# Patient Record
Sex: Male | Born: 1950
Health system: Southern US, Community
[De-identification: ages and names within clinical notes are randomized; demographics above are authoritative.]

## PROBLEM LIST (undated history)

## (undated) DIAGNOSIS — I471 Supraventricular tachycardia, unspecified: Secondary | ICD-10-CM

## (undated) DIAGNOSIS — K219 Gastro-esophageal reflux disease without esophagitis: Secondary | ICD-10-CM

## (undated) DIAGNOSIS — R351 Nocturia: Secondary | ICD-10-CM

## (undated) DIAGNOSIS — N400 Enlarged prostate without lower urinary tract symptoms: Secondary | ICD-10-CM

## (undated) DIAGNOSIS — F419 Anxiety disorder, unspecified: Secondary | ICD-10-CM

## (undated) DIAGNOSIS — M1990 Unspecified osteoarthritis, unspecified site: Secondary | ICD-10-CM

## (undated) DIAGNOSIS — I999 Unspecified disorder of circulatory system: Secondary | ICD-10-CM

## (undated) DIAGNOSIS — G459 Transient cerebral ischemic attack, unspecified: Secondary | ICD-10-CM

## (undated) DIAGNOSIS — G4700 Insomnia, unspecified: Secondary | ICD-10-CM

## (undated) DIAGNOSIS — N050 Unspecified nephritic syndrome with minor glomerular abnormality: Secondary | ICD-10-CM

## (undated) HISTORY — DX: Unspecified nephritic syndrome with minor glomerular abnormality: N05.0

## (undated) HISTORY — DX: Nocturia: R35.1

## (undated) HISTORY — DX: Unspecified disorder of circulatory system: I99.9

## (undated) HISTORY — DX: Insomnia, unspecified: G47.00

## (undated) HISTORY — DX: Benign prostatic hyperplasia without lower urinary tract symptoms: N40.0

## (undated) HISTORY — DX: Anxiety disorder, unspecified: F41.9

## (undated) HISTORY — PX: RENAL BIOPSY: SHX156

---

## 2011-01-04 ENCOUNTER — Ambulatory Visit: Payer: Self-pay | Admitting: Family Medicine

## 2011-01-12 ENCOUNTER — Ambulatory Visit: Payer: Self-pay | Admitting: Family Medicine

## 2011-02-23 ENCOUNTER — Ambulatory Visit: Payer: Self-pay

## 2011-03-15 LAB — HM COLONOSCOPY

## 2011-06-11 ENCOUNTER — Emergency Department: Payer: Self-pay | Admitting: Emergency Medicine

## 2011-06-11 LAB — COMPREHENSIVE METABOLIC PANEL
Albumin: 4.2 g/dL (ref 3.4–5.0)
Alkaline Phosphatase: 65 U/L (ref 50–136)
Anion Gap: 11 (ref 7–16)
Calcium, Total: 9.2 mg/dL (ref 8.5–10.1)
Chloride: 107 mmol/L (ref 98–107)
EGFR (African American): >60
EGFR (Non-African Amer.): >60
Osmolality: 286 (ref 275–301)
Potassium: 3.8 mmol/L (ref 3.5–5.1)

## 2011-06-11 LAB — CBC
HCT: 45.6 % (ref 40.0–52.0)
MCH: 31.1 pg (ref 26.0–34.0)
MCHC: 34.1 g/dL (ref 32.0–36.0)
Platelet: 252 10*3/uL (ref 150–440)
RBC: 5 10*6/uL (ref 4.40–5.90)

## 2011-06-11 LAB — CK TOTAL AND CKMB (NOT AT ARMC): CK-MB: 3.9 ng/mL — ABNORMAL HIGH (ref 0.5–3.6)

## 2011-06-11 LAB — TROPONIN I: Troponin-I: <0.02 ng/mL

## 2012-01-16 ENCOUNTER — Ambulatory Visit: Payer: Self-pay | Admitting: Gastroenterology

## 2012-04-18 ENCOUNTER — Ambulatory Visit: Payer: Self-pay | Admitting: Family Medicine

## 2012-04-18 LAB — DOT URINE DIP: Glucose,UR: NEGATIVE mg/dL (ref 0–75)

## 2012-08-20 DIAGNOSIS — Z85828 Personal history of other malignant neoplasm of skin: Secondary | ICD-10-CM | POA: Insufficient documentation

## 2014-01-11 DIAGNOSIS — IMO0001 Reserved for inherently not codable concepts without codable children: Secondary | ICD-10-CM | POA: Insufficient documentation

## 2014-01-11 DIAGNOSIS — N040 Nephrotic syndrome with minor glomerular abnormality: Secondary | ICD-10-CM | POA: Insufficient documentation

## 2014-01-28 DIAGNOSIS — M7060 Trochanteric bursitis, unspecified hip: Secondary | ICD-10-CM | POA: Insufficient documentation

## 2014-08-05 DIAGNOSIS — M25852 Other specified joint disorders, left hip: Secondary | ICD-10-CM | POA: Insufficient documentation

## 2014-08-26 ENCOUNTER — Other Ambulatory Visit: Payer: Self-pay | Admitting: Family Medicine

## 2014-08-26 ENCOUNTER — Telehealth: Payer: Self-pay

## 2014-08-26 DIAGNOSIS — F329 Major depressive disorder, single episode, unspecified: Secondary | ICD-10-CM

## 2014-08-26 DIAGNOSIS — F32A Depression, unspecified: Secondary | ICD-10-CM

## 2014-08-26 NOTE — Telephone Encounter (Signed)
Refill request via fax for Escitalopram

## 2014-09-03 DIAGNOSIS — G4700 Insomnia, unspecified: Secondary | ICD-10-CM | POA: Insufficient documentation

## 2014-09-22 ENCOUNTER — Ambulatory Visit (INDEPENDENT_AMBULATORY_CARE_PROVIDER_SITE_OTHER): Payer: 59 | Admitting: Urology

## 2014-09-22 VITALS — BP 116/79 | HR 61 | Resp 18 | Ht 69.0 in | Wt 189.7 lb

## 2014-09-22 DIAGNOSIS — K219 Gastro-esophageal reflux disease without esophagitis: Secondary | ICD-10-CM | POA: Insufficient documentation

## 2014-09-22 DIAGNOSIS — N138 Other obstructive and reflux uropathy: Secondary | ICD-10-CM | POA: Insufficient documentation

## 2014-09-22 DIAGNOSIS — R350 Frequency of micturition: Secondary | ICD-10-CM

## 2014-09-22 DIAGNOSIS — N401 Enlarged prostate with lower urinary tract symptoms: Secondary | ICD-10-CM

## 2014-09-22 LAB — BLADDER SCAN AMB NON-IMAGING

## 2014-09-22 NOTE — Progress Notes (Unsigned)
Patient ID: Matthew Maldonado, male   DOB: October 22, 1950, 64 y.o.   MRN: [id number]  Subjective: Matthew Maldonado is a 64 yo WM who is self referred with voiding symptoms.  Matthew Maldonado has a friend with prostate cancer and wants to get checked.  Matthew Maldonado has no hesitancy but Matthew Maldonado has a reduced stream but it is adequate.   Matthew Maldonado has occasional urgency.  Matthew Maldonado has nocturia x 2-3x.  Matthew Maldonado has some intermittency but empties well.  Matthew Maldonado has no hematuria or dysuria.  Matthew Maldonado has no history of UTI's, stones or GU surgery.  Matthew Maldonado has not had a prior PSA level. ROS:  Review of Systems  Constitutional: Positive for malaise/fatigue.  HENT:       Sinus congestion  Gastrointestinal: Positive for heartburn and diarrhea (all day).  Genitourinary:       Nocturia and intermittency with a weak stream  Musculoskeletal: Positive for back pain and joint pain.  All other systems reviewed and are negative.   No Known Allergies  Past Medical History  Diagnosis Date  . Anxiety   . Insomnia     History reviewed. No pertinent past surgical history.  History   Social History  . Marital Status: Married    Spouse Name: N/A  . Number of Children: N/A  . Years of Education: N/A   Occupational History  . Not on file.   Social History Main Topics  . Smoking status: Never Smoker   . Smokeless tobacco: Not on file  . Alcohol Use: 1.8 oz/week    3 Standard drinks or equivalent per week  . Drug Use: Not on file  . Sexual Activity: Not on file   Other Topics Concern  . Not on file   Social History Narrative    Family History  Problem Relation Age of Onset  . Family history unknown: Yes    Outpatient Encounter Prescriptions as of 09/22/2014  Medication Sig Note  . cycloSPORINE modified (NEORAL) 100 MG capsule Take 100 mg by mouth. 09/22/2014: Received from: [facility]  . escitalopram (LEXAPRO) 20 MG tablet take 1 tablet by mouth once daily MUST HAVE APPT FOR FURTHER REFILLS.   . fluticasone (FLONASE) 50 MCG/ACT nasal spray Place 2 sprays into  both nostrils daily.   Marland Kitchen ketoconazole (NIZORAL) 2 % shampoo Apply 1 application topically 2 (two) times a week.   . Multiple Vitamins-Minerals (MULTIVITAMIN WITH MINERALS) tablet Take 1 tablet by mouth daily.   . traZODone (DESYREL) 50 MG tablet Take by mouth. 09/22/2014: Received from: [place]  . [DISCONTINUED] Multiple Vitamins tablet Take by mouth. 09/22/2014: Received from: [facility]  . Omega-3 Fatty Acids (FISH OIL) 435 MG CAPS Take 1 capsule by mouth daily.   . [DISCONTINUED] cycloSPORINE (SANDIMMUNE) 100 MG capsule Take by mouth. 09/22/2014: Received from: [place]  . [DISCONTINUED] loratadine (CLARITIN REDITABS) 10 MG dissolvable tablet Take 10 mg by mouth daily.   . [DISCONTINUED] pseudoephedrine-guaifenesin (MUCINEX D) 60-600 MG per tablet Take 1 tablet by mouth every 12 (twelve) hours.   . [DISCONTINUED] ranitidine (ZANTAC) 150 MG capsule Take 150 mg by mouth 2 (two) times daily.    No facility-administered encounter medications on file as of 09/22/2014.      Objective: Filed Vitals:   09/22/14 1153  BP: 116/79  Pulse: 61  Resp: 18     Physical Exam  Constitutional: Matthew Maldonado is oriented to person, place, and time and well-developed, well-nourished, and in no distress.  HENT:  Head: Normocephalic and atraumatic.  Neck: Normal range of motion. Neck supple.  Cardiovascular: Normal rate, regular rhythm and normal heart sounds.   Pulmonary/Chest: Effort normal and breath sounds normal. No respiratory distress.  Abdominal: Soft. Matthew Maldonado exhibits no distension and no mass. There is no tenderness. There is no guarding.  Genitourinary: Rectum normal and penis normal.  Prostate is 2+ symmetrical and smooth without nodules.   There are external and internal hemorrhoids.  No masses are noted.   Musculoskeletal: Normal range of motion. Matthew Maldonado exhibits no edema.  Lymphadenopathy:    Matthew Maldonado has no cervical adenopathy.  Neurological: Matthew Maldonado is alert and oriented  to person, place, and time.  Skin: Skin is warm and dry.  Psychiatric: Mood and affect normal.  Vitals reviewed.   Lab Results:  No results for input(s): WBC, HGB, HCT, PLT in the last 72 hours. BMET No results for input(s): NA, K, CL, CO2, GLUCOSE, BUN, CREATININE, CALCIUM in the last 72 hours. PT/INR No results for input(s): LABPROT, INR in the last 72 hours. ABG No results for input(s): PHART, HCO3 in the last 72 hours.  Invalid input(s): PCO2, PO2  Studies/Results: No results found. IPSS is 20/4  PVR by [place] 8ml  Assessment: Matthew Maldonado has BPH with BOO with moderated LUTS.  Matthew Maldonado is most bothered by nocturia.  Matthew Maldonado is concerned about possible prostate cancer but has a benign exam.  Plan: I am going to get a PSA. Matthew Maldonado was unable to get a urine. I will give him samples of Rapaflo 8mg  #21 and reviewed the side effects and instructions. Matthew Maldonado will return in 2-3 weeks for a repeat IPSS.   Macallister Ashmead J 09/22/2014

## 2014-09-23 LAB — PSA TOTAL (REFLEX TO FREE)

## 2014-10-07 ENCOUNTER — Ambulatory Visit: Payer: Self-pay

## 2014-10-30 ENCOUNTER — Ambulatory Visit (INDEPENDENT_AMBULATORY_CARE_PROVIDER_SITE_OTHER): Payer: 59 | Admitting: Urology

## 2014-10-30 ENCOUNTER — Encounter: Payer: Self-pay | Admitting: Urology

## 2014-10-30 VITALS — BP 131/91 | HR 57 | Resp 16 | Ht 69.0 in | Wt 197.9 lb

## 2014-10-30 DIAGNOSIS — R351 Nocturia: Secondary | ICD-10-CM

## 2014-10-30 DIAGNOSIS — N401 Enlarged prostate with lower urinary tract symptoms: Secondary | ICD-10-CM | POA: Diagnosis not present

## 2014-10-30 DIAGNOSIS — N400 Enlarged prostate without lower urinary tract symptoms: Secondary | ICD-10-CM

## 2014-10-30 DIAGNOSIS — N138 Other obstructive and reflux uropathy: Secondary | ICD-10-CM

## 2014-10-30 LAB — BLADDER SCAN AMB NON-IMAGING

## 2014-10-30 NOTE — Progress Notes (Signed)
10/30/2014 9:51 AM   Matthew Maldonado Dec 31, 1950 [id number]  Referring provider: Gearldine Shown, DO [place], [place] [address]  Chief Complaint  Patient presents with  . Follow-up    2 wk f/u on medication (PSA, IPSS, PVR)  . Benign Prostatic Hypertrophy    HPI: Patient is a 64 year old white male who referred himself to [place] after a friend had been diagnosed with prostate cancer.  He was having a reduced stream, occasional urgency, intermittency and nocturia 3.    He was seen and evaluated by Dr. Jeffie Pollock 3 weeks ago and was found to have a benign prostate exam and was started on Rapaflo 8 mg for his lower urinary tract symptoms.  He presents today to discuss his response to the Rapaflo.  His IPSS score today is 11, which is moderate lower urinary tract symptomatology. He is delighted with his quality life due to his urinary symptoms. His PVR is 56 mL.  His previous IPSS score was 21/4.  His previous PVR is 64 mL.    He actually did not take the Rapaflo samples that were given to him for fear they may interact with cyclosporine that he was prescribed.  He instead did some behavioral modification and reduced his fluid intake prior to bed and has found an improvement of his symptoms. He denies any dysuria, hematuria or suprapubic pain.      He also denies any recent fevers, chills, nausea or vomiting.      IPSS      09/22/14 1200 10/30/14 0900     International Prostate Symptom Score   How often have you had the sensation of not emptying your bladder? About half the time Less than 1 in 5    How often have you had to urinate less than every two hours? More than half the time Less than half the time    How often have you found you stopped and started again several times when you urinated? More than half the time Less than half the time    How often have you found it difficult to postpone urination? About half the time Less than half the time    How often have you  had a weak urinary stream? More than half the time Less than half the time    How often have you had to strain to start urination? Not at All Not at All    How many times did you typically get up at night to urinate? 3 Times 2 Times    Total IPSS Score 21 11    Quality of Life due to urinary symptoms   If you were to spend the rest of your life with your urinary condition just the way it is now how would you feel about that? Mostly Disatisfied Delighted       Score:  1-7 Mild 8-19 Moderate 20-35 Severe  PMH: Past Medical History  Diagnosis Date  . Anxiety   . Insomnia   . Minimal change disease     Surgical History: No past surgical history on file.  Home Medications:    Medication List       This list is accurate as of: 10/30/14  9:51 AM.  Always use your most recent med list.               cycloSPORINE modified 100 MG capsule  Commonly known as:  NEORAL  Take 100 mg by mouth.     escitalopram 20  MG tablet  Commonly known as:  LEXAPRO  take 1 tablet by mouth once daily MUST HAVE APPT FOR FURTHER REFILLS.     Fish Oil 435 MG Caps  Take 1 capsule by mouth daily.     fluticasone 50 MCG/ACT nasal spray  Commonly known as:  FLONASE  Place 2 sprays into both nostrils daily.     ketoconazole 2 % shampoo  Commonly known as:  NIZORAL     multivitamin with minerals tablet  Take 1 tablet by mouth daily.     traZODone 50 MG tablet  Commonly known as:  DESYREL  Take by mouth.        Allergies: No Known Allergies  Family History: Family History  Problem Relation Age of Onset  . Family history unknown: Yes    Social History:  reports that he has never smoked. He does not have any smokeless tobacco history on file. He reports that he drinks about 1.8 oz of alcohol per week. His drug history is not on file.  ROS: UROLOGY Frequent Urination?: No Hard to postpone urination?: No Burning/pain with urination?: No Get up at night to urinate?: Yes Leakage of  urine?: No Urine stream starts and stops?: No Trouble starting stream?: No Do you have to strain to urinate?: No Blood in urine?: No Urinary tract infection?: No Sexually transmitted disease?: No Injury to kidneys or bladder?: No Painful intercourse?: No Weak stream?: No Erection problems?: No Penile pain?: No  Gastrointestinal Nausea?: No Vomiting?: No Indigestion/heartburn?: No Diarrhea?: No Constipation?: No  Constitutional Fever: No Night sweats?: No Weight loss?: No Fatigue?: No  Skin Skin rash/lesions?: No Itching?: No  Eyes Blurred vision?: No Double vision?: No  Ears/Nose/Throat Sore throat?: No Sinus problems?: No  Hematologic/Lymphatic Swollen glands?: No Easy bruising?: No  Cardiovascular Leg swelling?: No Chest pain?: No  Respiratory Cough?: No Shortness of breath?: No  Endocrine Excessive thirst?: No  Musculoskeletal Back pain?: Yes Joint pain?: Yes  Neurological Headaches?: No Dizziness?: No  Psychologic Depression?: No Anxiety?: No  Physical Exam: BP 131/91 mmHg  Pulse 57  Resp 16  Ht 5\' 9"  (1.753 m)  Wt 197 lb 14.4 oz (89.767 kg)  BMI 29.21 kg/m2   Laboratory Data: Lab Results  Component Value Date   WBC 7.8 06/11/2011   HGB 15.6 06/11/2011   HCT 45.6 06/11/2011   MCV 91 06/11/2011   PLT 252 06/11/2011    Lab Results  Component Value Date   CREATININE 0.94 06/11/2011    Lab Results  Component Value Date   PSA CANCELED 09/22/2014    Pertinent Imaging: Results for orders placed or performed in visit on 10/30/14  PSA  Result Value Ref Range   Prostate Specific Ag, Serum 0.8 0.0 - 4.0 ng/mL  BLADDER SCAN AMB NON-IMAGING  Result Value Ref Range   Scan Result 68ml     Assessment & Plan:    1. BPH (benign prostatic hyperplasia) with LUTS:   Patient's IPSS score is 11/0.  His PVR 56 mL.  His DRE demonstrates enlargement, no nodules.   The patient still has the Rapaflo samples that were given to him.  He  will hold on to the samples for now and will take the samples if his nocturia should worsen.   A PSA level is drawn today and follow-up will be pending those results.  - PSA - BLADDER SCAN AMB NON-IMAGING  2. Nocturia:   Patient has noticed an improvement in his nocturia was some behavioral and  dietary modifications.  He will continue these modifications.  He will take the Rapaflo samples if he experiences a worsening is nocturia and contact the office with results.  No Follow-up on file.  [name], [facility] [address] [place], [place] [address] [phone]

## 2014-10-31 LAB — PSA: Prostate Specific Ag, Serum: 0.8 ng/mL (ref 0.0–4.0)

## 2014-11-03 ENCOUNTER — Telehealth: Payer: Self-pay

## 2014-11-03 NOTE — Telephone Encounter (Signed)
Spoke with pt and made aware of PSA results. Pt voiced understanding.

## 2014-11-03 NOTE — Telephone Encounter (Signed)
-----   Message from Nori Riis, PA-C sent at 11/02/2014  7:06 PM EDT ----- Patient's PSA is normal.  I would suggest annual surveillance with PSA and DRE.

## 2014-11-06 DIAGNOSIS — I498 Other specified cardiac arrhythmias: Secondary | ICD-10-CM | POA: Insufficient documentation

## 2014-11-08 ENCOUNTER — Telehealth: Payer: Self-pay | Admitting: Urology

## 2014-11-08 DIAGNOSIS — N401 Enlarged prostate with lower urinary tract symptoms: Principal | ICD-10-CM

## 2014-11-08 DIAGNOSIS — N138 Other obstructive and reflux uropathy: Secondary | ICD-10-CM | POA: Insufficient documentation

## 2014-11-08 DIAGNOSIS — R351 Nocturia: Secondary | ICD-10-CM | POA: Insufficient documentation

## 2014-11-08 DIAGNOSIS — N400 Enlarged prostate without lower urinary tract symptoms: Secondary | ICD-10-CM

## 2014-11-08 NOTE — Telephone Encounter (Signed)
I did some research over the weekend concerning cyclosporine and Rapaflo.  The combination is contraindicated. The 2 medications taken and combination may increase the Rapaflo levels in the patient.  Cialis 5 mg daily would be a better option for the patient.  If his nocturia is still bothersome to him, he may have Cialis 5 mg daily prescribed for him and a follow-up in one month for symptom recheck and PVR.

## 2014-11-10 MED ORDER — TADALAFIL 5 MG PO TABS: 5.0000 mg | ORAL_TABLET | Freq: Every day | ORAL | Status: DC | PRN

## 2014-11-10 NOTE — Telephone Encounter (Signed)
Spoke with pt who stated he currently is not taking rapaflo due to possible contraindications. Pt stated he would like to try another medication. Cialis 5mg  was called into. Pt was transferred to the front to make f/u appt in 18mo.

## 2014-12-11 ENCOUNTER — Ambulatory Visit: Payer: 59 | Admitting: Urology

## 2015-01-01 ENCOUNTER — Encounter: Payer: Self-pay | Admitting: Urology

## 2015-01-01 ENCOUNTER — Ambulatory Visit (INDEPENDENT_AMBULATORY_CARE_PROVIDER_SITE_OTHER): Payer: 59 | Admitting: Urology

## 2015-01-01 VITALS — BP 133/79 | HR 67 | Ht 69.0 in | Wt 192.3 lb

## 2015-01-01 DIAGNOSIS — N401 Enlarged prostate with lower urinary tract symptoms: Secondary | ICD-10-CM | POA: Diagnosis not present

## 2015-01-01 DIAGNOSIS — N138 Other obstructive and reflux uropathy: Secondary | ICD-10-CM

## 2015-01-01 DIAGNOSIS — R351 Nocturia: Secondary | ICD-10-CM | POA: Diagnosis not present

## 2015-01-01 LAB — BLADDER SCAN AMB NON-IMAGING: Scan Result: 22

## 2015-01-01 MED ORDER — TADALAFIL 5 MG PO TABS: 5.0000 mg | ORAL_TABLET | Freq: Every day | ORAL | Status: DC | PRN

## 2015-01-01 NOTE — Progress Notes (Signed)
01/01/2015 4:26 PM   Matthew Maldonado 03/02/1951 [id number]  Referring provider: Gearldine Shown, DO Matthew Maldon[place], [place] [address]  Chief Complaint  Patient presents with  . Benign Prostatic Hypertrophy    one month follow up  . Nocturia    HPI: Patient is a 64 year old white male who was having symptoms of BPH with LUTS and a friend who was recently diagnosed with prostate cancer he was placed on Cialis 5 mg daily after it was discovered his Rapaflo was contraindicated with another medication that he was taking.  BPH WITH LUTS His IPSS score today is 12, which is modrate lower urinary tract symptomatology. He is mostly satisfied with his quality life due to his urinary symptoms. His PVR is 22 mL.  His previous IPSS score was 11/0.  His previous PVR is 56 mL.  he has noticed a decrease in his nocturia from 2-3 times daily to just 1 times nightly.  He denies any dysuria, hematuria or suprapubic pain.   He also denies any recent fevers, chills, nausea or vomiting.  He does not have a family history of PCa.      IPSS      01/01/15 1600       International Prostate Symptom Score   How often have you had the sensation of not emptying your bladder? Less than 1 in 5     How often have you had to urinate less than every two hours? About half the time     How often have you found you stopped and started again several times when you urinated? Less than half the time     How often have you found it difficult to postpone urination? Less than 1 in 5 times     How often have you had a weak urinary stream? Less than half the time     How often have you had to strain to start urination? Not at All     How many times did you typically get up at night to urinate? 3 Times     Total IPSS Score 12     Quality of Life due to urinary symptoms   If you were to spend the rest of your life with your urinary condition just the way it is now how would you feel about that? Mostly Satisfied         Score:  1-7 Mild 8-19 Moderate 20-35 Severe     PMH: Past Medical History  Diagnosis Date  . Anxiety   . Insomnia   . Minimal change disease     Surgical History: No past surgical history on file.  Home Medications:    Medication List       This list is accurate as of: 01/01/15  4:26 PM.  Always use your most recent med list.               cyclobenzaprine 10 MG tablet  Commonly known as:  FLEXERIL  Take by mouth.     cycloSPORINE modified 100 MG capsule  Commonly known as:  NEORAL  Take 100 mg by mouth.     diclofenac sodium 1 % Gel  Commonly known as:  VOLTAREN  Apply topically.     escitalopram 20 MG tablet  Commonly known as:  LEXAPRO  take 1 tablet by mouth once daily MUST HAVE APPT FOR FURTHER REFILLS.     esomeprazole 20 MG capsule  Commonly known as:  NEXIUM  Take by  mouth.     Fish Oil 435 MG Caps  Take 1 capsule by mouth daily.     fluticasone 50 MCG/ACT nasal spray  Commonly known as:  FLONASE  Place 2 sprays into both nostrils daily.     ketoconazole 2 % shampoo  Commonly known as:  NIZORAL     multivitamin with minerals tablet  Take 1 tablet by mouth daily.     tadalafil 5 MG tablet  Commonly known as:  CIALIS  Take 1 tablet (5 mg total) by mouth daily as needed for erectile dysfunction.     traZODone 50 MG tablet  Commonly known as:  DESYREL  Take by mouth.        Allergies: No Known Allergies  Family History: Family History  Problem Relation Age of Onset  . Kidney disease Neg Hx   . Prostate cancer Neg Hx     Social History:  reports that he has never smoked. He does not have any smokeless tobacco history on file. He reports that he drinks about 1.8 oz of alcohol per week. He reports that he does not use illicit drugs.  ROS: UROLOGY Frequent Urination?: No Hard to postpone urination?: No Burning/pain with urination?: No Get up at night to urinate?: Yes Leakage of urine?: No Urine stream starts and  stops?: Yes Trouble starting stream?: No Do you have to strain to urinate?: No Blood in urine?: No Urinary tract infection?: No Sexually transmitted disease?: No Injury to kidneys or bladder?: No Painful intercourse?: No Weak stream?: No Erection problems?: No Penile pain?: No  Gastrointestinal Nausea?: No Vomiting?: No Indigestion/heartburn?: Yes Diarrhea?: No Constipation?: No  Constitutional Fever: No Night sweats?: No Weight loss?: No Fatigue?: Yes  Skin Skin rash/lesions?: No Itching?: No  Eyes Blurred vision?: No Double vision?: No  Ears/Nose/Throat Sore throat?: No Sinus problems?: No  Hematologic/Lymphatic Swollen glands?: No Easy bruising?: No  Cardiovascular Leg swelling?: No Chest pain?: No  Respiratory Cough?: No Shortness of breath?: No  Endocrine Excessive thirst?: No  Musculoskeletal Back pain?: No Joint pain?: No  Neurological Headaches?: No Dizziness?: No  Psychologic Depression?: No Anxiety?: No  Physical Exam: BP 133/79 mmHg  Pulse 67  Ht 5\' 9"  (1.753 m)  Wt 192 lb 4.8 oz (87.227 kg)  BMI 28.38 kg/m2   Laboratory Data: Lab Results  Component Value Date   WBC 7.8 06/11/2011   HGB 15.6 06/11/2011   HCT 45.6 06/11/2011   MCV 91 06/11/2011   PLT 252 06/11/2011    Lab Results  Component Value Date   CREATININE 0.94 06/11/2011    Lab Results  Component Value Date   PSA 0.8 10/30/2014   PSA CANCELED 09/22/2014     Pertinent Imaging: Results for Matthew Maldonado, Matthew Maldonado (MRN [id number]) as of 01/01/2015 16:08  Ref. Range 01/01/2015 16:04  Scan Result Unknown 22    Assessment & Plan:    1. BPH (benign prostatic hyperplasia) with LUTS:   Patient's IPSS score is 12/2. His PVR 22 mL. His DRE demonstrates enlargement, no nodules. He found the Cialis 5 mg daily effective.   A refill is sent to his pharmacy.  I have given him samples and a coupon.  He will RTC in 12 months for IPSS score and PSA.    - BLADDER SCAN AMB  NON-IMAGING  2. Nocturia: Patient has noticed an improvement in his nocturia with the Cialis 5 mg daily.   He will continue that medication.  He will RTC in one year.  Return in about 1 year (around 01/01/2016) for IPSS.  Matthew Maldonado, [facility] [address], [place], [place] [address] [phone]

## 2015-04-09 ENCOUNTER — Telehealth: Payer: Self-pay

## 2015-04-09 NOTE — Telephone Encounter (Signed)
Attempted to do pt PA for cialis 5mg . Insurance became inactive as of 03/14/15.---not able to complete PA

## 2015-05-15 ENCOUNTER — Telehealth: Payer: Self-pay

## 2015-05-15 NOTE — Telephone Encounter (Signed)
PA for cialis 5mg  has been approved. LMOM- making pt aware.

## 2015-11-26 ENCOUNTER — Emergency Department
Admission: EM | Admit: 2015-11-26 | Discharge: 2015-11-26 | Disposition: A | Discharge status: Home / Self Care | Payer: Medicare Other | Attending: Student | Admitting: Student

## 2015-11-26 ENCOUNTER — Emergency Department: Payer: Medicare Other

## 2015-11-26 ENCOUNTER — Encounter: Payer: Self-pay | Admitting: *Deleted

## 2015-11-26 DIAGNOSIS — R002 Palpitations: Secondary | ICD-10-CM | POA: Insufficient documentation

## 2015-11-26 DIAGNOSIS — R55 Syncope and collapse: Secondary | ICD-10-CM | POA: Diagnosis not present

## 2015-11-26 DIAGNOSIS — R0789 Other chest pain: Secondary | ICD-10-CM | POA: Insufficient documentation

## 2015-11-26 DIAGNOSIS — R42 Dizziness and giddiness: Secondary | ICD-10-CM

## 2015-11-26 DIAGNOSIS — R001 Bradycardia, unspecified: Secondary | ICD-10-CM | POA: Diagnosis not present

## 2015-11-26 LAB — BASIC METABOLIC PANEL
ANION GAP: 7 (ref 5–15)
BUN: 28 mg/dL — ABNORMAL HIGH (ref 6–20)
CHLORIDE: 104 mmol/L (ref 101–111)
CO2: 23 mmol/L (ref 22–32)
Calcium: 9 mg/dL (ref 8.9–10.3)
Creatinine, Ser: 1.14 mg/dL (ref 0.61–1.24)
GFR calc non Af Amer: >60 mL/min (ref >60)
Glucose, Bld: 147 mg/dL — ABNORMAL HIGH (ref 65–99)
Potassium: 3.6 mmol/L (ref 3.5–5.1)
SODIUM: 134 mmol/L — AB (ref 135–145)

## 2015-11-26 LAB — T4, FREE: FREE T4: 0.74 ng/dL (ref 0.61–1.12)

## 2015-11-26 LAB — CBC
HCT: 40.6 % (ref 40.0–52.0)
HEMOGLOBIN: 14.3 g/dL (ref 13.0–18.0)
MCH: 32.3 pg (ref 26.0–34.0)
MCHC: 35.2 g/dL (ref 32.0–36.0)
MCV: 91.8 fL (ref 80.0–100.0)
PLATELETS: 213 10*3/uL (ref 150–440)
RBC: 4.42 MIL/uL (ref 4.40–5.90)
RDW: 13.4 % (ref 11.5–14.5)
WBC: 5.3 10*3/uL (ref 3.8–10.6)

## 2015-11-26 LAB — TSH: TSH: 1.407 u[IU]/mL (ref 0.350–4.500)

## 2015-11-26 LAB — TROPONIN I: Troponin I: <0.03 ng/mL (ref <0.03)

## 2015-11-26 NOTE — ED Triage Notes (Signed)
Pt reports he was playing golf today when he had a sudden onset of chest tightness and what the pt verbalized as a "fullter" in his chest. Pt reports he began feeling weak and dizzy. Pt denies LOC. Pt reports he felt dizzy again when walking into ED and feels the flutter and little less but reports it is still present. Pt reports he had a similar event 3 years ago and was seen by Dr. Gigi Gin. Pt is alert and oriented in triage with no SOB or distress noted.

## 2015-11-26 NOTE — ED Notes (Signed)
Pt. Verbalizes understanding of d/c instructions, and follow-up. VS stable and pain denied by pt.  Pt. In NAD at time of d/c and denies further concerns regarding this visit. Pt. Stable at the time of departure from the unit, departing unit by the safest and most appropriate manner per that pt condition and limitations. Pt advised to return to the ED at any time for emergent concerns, or for new/worsening symptoms.

## 2015-11-26 NOTE — Discharge Instructions (Signed)
You were seen in the emergency department for palpitations. Your workup is reassuring but you need to follow up with Dr. Nehemiah Massed as soon as possible, ideally tomorrow, so that you can have repeat Holter monitor testing. Return immediately to the emergency department if you have more lightheadedness, dizziness, any chest pain, difficulty breathing, numbness or weakness in the arms or legs, fainting or near fainting, fevers, abdominal pain, vomiting, or for any other concerns.

## 2015-11-26 NOTE — ED Provider Notes (Signed)
[facility] Emergency Department Provider Note   ____________________________________________   Time seen approximately 4:55 pm  I have reviewed the triage vital signs and the nursing notes.   HISTORY  Chief Complaint Palpitations and Dizziness    HPI Matthew Maldonado is a 65 y.o. male with history of palpitations, nephrotic syndrome with minimal change disease presents for evaluation of a sensation of heart racing associated with lightheadedness today that began while he was playing golf, gradual onset, initially moderate, now resolved, no modifying factors. Patient reports that at 10:30 AM while he was playing golf he felt as if his heart was "fluttering" beating quickly in his chest, he felt lightheaded and had some mild chest tightness but no shortness of breath, he did not faint. He reports that this lasted until approximately 12:00 when he was driving himself to the hospital at which point it terminated itself. He has not had any recurrence since and that was nearly 5 hours ago. He is currently asymptomatic. He reports that he has had something similar previously and is followed by Dr. Nehemiah Massed, had a negative Holter monitor test. He denies any recent illness include no vomiting, diarrhea, fevers or chills. No pain or burning with urination. No numbness, weakness, vision change or headache.   Past Medical History:  Diagnosis Date  . Anxiety   . Insomnia   . Minimal change disease     Patient Active Problem List   Diagnosis Date Noted  . BPH with obstruction/lower urinary tract symptoms 11/08/2014  . Nocturia 11/08/2014  . Acid reflux 09/22/2014  . BPH (benign prostatic hypertrophy) with urinary obstruction 09/22/2014  . Insomnia, persistent 09/03/2014  . Enthesopathy of hip 08/05/2014  . Bursitis, trochanteric 01/28/2014  . 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 H/O malignant neoplasm of skin 08/20/2012    History reviewed. No pertinent surgical  history.  Prior to Admission medications   Medication Sig Start Date End Date Taking? Authorizing Provider  cycloSPORINE modified (NEORAL) 100 MG capsule Take 100 mg by mouth. 09/03/14   Historical Provider, MD  escitalopram (LEXAPRO) 20 MG tablet take 1 tablet by mouth once daily MUST HAVE APPT FOR FURTHER REFILLS. 08/26/14   Juline Patch, MD  esomeprazole (NEXIUM) 20 MG capsule Take by mouth.    Historical Provider, MD  fluticasone (FLONASE) 50 MCG/ACT nasal spray Place 2 sprays into both nostrils daily.    Historical Provider, MD  ketoconazole (NIZORAL) 2 % shampoo  09/23/14   Historical Provider, MD  Multiple Vitamins-Minerals (MULTIVITAMIN WITH MINERALS) tablet Take 1 tablet by mouth daily.    Historical Provider, MD  Omega-3 Fatty Acids (FISH OIL) 435 MG CAPS Take 1 capsule by mouth daily.    Historical Provider, MD  tadalafil (CIALIS) 5 MG tablet Take 1 tablet (5 mg total) by mouth daily as needed for erectile dysfunction. 01/01/15   Nori Riis, PA-C  traZODone (DESYREL) 50 MG tablet Take by mouth. 09/03/14 09/03/15  Historical Provider, MD    Allergies Review of patient's allergies indicates no known allergies.  Family History  Problem Relation Age of Onset  . Kidney disease Neg Hx   . Prostate cancer Neg Hx     Social History Social History  Substance Use Topics  . Smoking status: Never Smoker  . Smokeless tobacco: Never Used  . Alcohol use 1.8 oz/week    3 Standard drinks or equivalent per week    Review of Systems Constitutional: No fever/chills Eyes: No visual changes. ENT: No sore  throat. Cardiovascular: +mild chest tightness with palpitations Respiratory: Denies shortness of breath. Gastrointestinal: No abdominal pain.  No nausea, no vomiting.  No diarrhea.  No constipation. Genitourinary: Negative for dysuria. Musculoskeletal: Negative for back pain. Skin: Negative for rash. Neurological: Negative for headaches, focal weakness or numbness.  10-point ROS  otherwise negative.  ____________________________________________   PHYSICAL EXAM:  Vitals:   11/26/15 1242 11/26/15 1556 11/26/15 1800 11/26/15 1910  BP:  103/63 113/76 120/84  Pulse:  (!) 55  (!) 47  Resp:  18 14 18   Temp:  98 F (36.7 C)    TempSrc:  Oral    SpO2:  97% 98% 97%  Weight: 170 lb (77.1 kg)     Height: 5\' 9"  (1.753 m)       VITAL SIGNS: ED Triage Vitals  Enc Vitals Group     BP 11/26/15 1556 103/63     Pulse Rate 11/26/15 1556 (!) 55     Resp 11/26/15 1556 18     Temp 11/26/15 1556 98 F (36.7 C)     Temp Source 11/26/15 1556 Oral     SpO2 11/26/15 1556 97 %     Weight 11/26/15 1242 170 lb (77.1 kg)     Height 11/26/15 1242 5\' 9"  (1.753 m)     Head Circumference --      Peak Flow --      Pain Score --      Pain Loc --      Pain Edu? --      Excl. in [place]? --     Constitutional: Alert and oriented. Well appearing and in no acute distress. Eyes: Conjunctivae are normal. PERRL. EOMI. Head: Atraumatic. Nose: No congestion/rhinnorhea. Mouth/Throat: Mucous membranes are moist.  Oropharynx non-erythematous. Neck: No stridor.  Supple without meningismus. Cardiovascular: mildly bradycardic rate, regular rhythm. Grossly normal heart sounds.  Good peripheral circulation. Respiratory: Normal respiratory effort.  No retractions. Lungs CTAB. Gastrointestinal: Soft and nontender. No distention.  No CVA tenderness. Genitourinary: deferred Musculoskeletal: No lower extremity tenderness nor edema.  No joint effusions. Neurologic:  Normal speech and language. No gross focal neurologic deficits are appreciated. No gait instability. Skin:  Skin is warm, dry and intact. No rash noted. Psychiatric: Mood and affect are normal. Speech and behavior are normal.  ____________________________________________   LABS (all labs ordered are listed, but only abnormal results are displayed)  Labs Reviewed  BASIC METABOLIC PANEL - Abnormal; Notable for the following:        Result Value   Sodium 134 (*)    Glucose, Bld 147 (*)    BUN 28 (*)    All other components within normal limits  CBC  TROPONIN I  TROPONIN I  TSH  T4, FREE   ____________________________________________  EKG  ED ECG REPORT I, Joanne Gavel, the attending physician, personally viewed and interpreted this ECG.   Date: 11/26/2015  EKG Time: 12:39  Rate: 73  Rhythm: normal sinus rhythm with immature supraventricular complexes.  Axis: normal  Intervals:right bundle branch block  ST&T Change: No acute ST elevation or acute ST depression. No STEMI, no Brugada, normal QTC.  ____________________________________________  RADIOLOGY  CXR IMPRESSION:  Small amount of linear scarring at the left lung base. No acute  abnormality.    ____________________________________________   PROCEDURES  Procedure(s) performed: None  Procedures  Critical Care performed: No  ____________________________________________   INITIAL IMPRESSION / ASSESSMENT AND PLAN / ED COURSE  Pertinent labs & imaging results that were available  during my care of the patient were reviewed by me and considered in my medical decision making (see chart for details).  Matthew Maldonado is a 65 y.o. male with history of palpitations, nephrotic syndrome with minimal change disease presents for evaluation of a sensation of heart racing associated with lightheadedness today that began while he was playing golf. On Exam, he is very well-appearing and in no acute distress. He is mildly intermittently bradycardic but the remainder of his vital signs are stable and he is afebrile. I suspect this may be his baseline heart rate given that he is generally healthy without comorbidities. His exam is benign. EKG is not consistent with acute ischemia, no arrhythmia, right bundle-branch block is noted. We'll obtain screening labs, observe on the continuous cardiac telemetry monitor to evaluate for any evidence of recurrent  arrhythmia. We'll obtain 2 sets of cardiac enzymes, chest x-ray and reassess for disposition.  ----------------------------------------- 7:13 PM on 11/26/2015 ----------------------------------------- CBC, CMP, thyroid studies unremarkable. 2 sets of troponins are negative. Patient has been observed for several hours in the emergency department on monitor without any clinically significant arrhythmia. He does have some mild bradycardia at the time of discharge but maintaining adequate blood pressure and sitting appropriately and still asymptomatic. Marland Kitchen He reports he feels well and is requesting discharge. I discussed the case with Dr. Saralyn Pilar who reports that the patient can call the office tomorrow morning for expedited follow-up appointment. I discussed this with the patient, we also discussed meticulous return precautions and need for close follow-up and he is comfortable with the discharge plan. DC home. asymptomatic.  Clinical Course     ____________________________________________   FINAL CLINICAL IMPRESSION(S) / ED DIAGNOSES  Final diagnoses:  Palpitations  Lightheadedness      NEW MEDICATIONS STARTED DURING THIS VISIT:  Discharge Medication List as of 11/26/2015  7:16 PM       Note:  This document was prepared using Dragon voice recognition software and may include unintentional dictation errors.    Joanne Gavel, MD 11/26/15 2056

## 2015-12-19 DIAGNOSIS — Z23 Encounter for immunization: Secondary | ICD-10-CM | POA: Diagnosis not present

## 2015-12-20 DIAGNOSIS — R002 Palpitations: Secondary | ICD-10-CM | POA: Diagnosis not present

## 2015-12-20 DIAGNOSIS — Z8639 Personal history of other endocrine, nutritional and metabolic disease: Secondary | ICD-10-CM | POA: Diagnosis not present

## 2015-12-20 DIAGNOSIS — R42 Dizziness and giddiness: Secondary | ICD-10-CM | POA: Diagnosis not present

## 2015-12-21 DIAGNOSIS — R42 Dizziness and giddiness: Secondary | ICD-10-CM | POA: Diagnosis not present

## 2015-12-21 DIAGNOSIS — I4510 Unspecified right bundle-branch block: Secondary | ICD-10-CM | POA: Diagnosis not present

## 2015-12-21 DIAGNOSIS — Z8639 Personal history of other endocrine, nutritional and metabolic disease: Secondary | ICD-10-CM | POA: Diagnosis not present

## 2015-12-21 DIAGNOSIS — R002 Palpitations: Secondary | ICD-10-CM | POA: Diagnosis not present

## 2015-12-23 DIAGNOSIS — R002 Palpitations: Secondary | ICD-10-CM | POA: Diagnosis not present

## 2015-12-23 DIAGNOSIS — R0602 Shortness of breath: Secondary | ICD-10-CM | POA: Diagnosis not present

## 2015-12-23 DIAGNOSIS — E782 Mixed hyperlipidemia: Secondary | ICD-10-CM | POA: Diagnosis not present

## 2015-12-23 DIAGNOSIS — R42 Dizziness and giddiness: Secondary | ICD-10-CM | POA: Diagnosis not present

## 2015-12-25 DIAGNOSIS — R200 Anesthesia of skin: Secondary | ICD-10-CM | POA: Diagnosis not present

## 2015-12-25 DIAGNOSIS — R809 Proteinuria, unspecified: Secondary | ICD-10-CM | POA: Diagnosis not present

## 2015-12-25 DIAGNOSIS — Z6825 Body mass index (BMI) 25.0-25.9, adult: Secondary | ICD-10-CM | POA: Diagnosis not present

## 2015-12-25 DIAGNOSIS — R42 Dizziness and giddiness: Secondary | ICD-10-CM | POA: Diagnosis not present

## 2015-12-25 DIAGNOSIS — N049 Nephrotic syndrome with unspecified morphologic changes: Secondary | ICD-10-CM | POA: Diagnosis not present

## 2015-12-25 DIAGNOSIS — R739 Hyperglycemia, unspecified: Secondary | ICD-10-CM | POA: Diagnosis not present

## 2015-12-25 DIAGNOSIS — G458 Other transient cerebral ischemic attacks and related syndromes: Secondary | ICD-10-CM | POA: Diagnosis not present

## 2015-12-25 DIAGNOSIS — R002 Palpitations: Secondary | ICD-10-CM | POA: Diagnosis not present

## 2015-12-29 DIAGNOSIS — G459 Transient cerebral ischemic attack, unspecified: Secondary | ICD-10-CM | POA: Diagnosis not present

## 2015-12-29 DIAGNOSIS — Z6825 Body mass index (BMI) 25.0-25.9, adult: Secondary | ICD-10-CM | POA: Diagnosis not present

## 2015-12-29 DIAGNOSIS — R42 Dizziness and giddiness: Secondary | ICD-10-CM | POA: Diagnosis not present

## 2015-12-29 DIAGNOSIS — R002 Palpitations: Secondary | ICD-10-CM | POA: Diagnosis not present

## 2015-12-29 DIAGNOSIS — N049 Nephrotic syndrome with unspecified morphologic changes: Secondary | ICD-10-CM | POA: Diagnosis not present

## 2015-12-30 DIAGNOSIS — G458 Other transient cerebral ischemic attacks and related syndromes: Secondary | ICD-10-CM | POA: Diagnosis not present

## 2015-12-30 DIAGNOSIS — I6602 Occlusion and stenosis of left middle cerebral artery: Secondary | ICD-10-CM | POA: Diagnosis not present

## 2015-12-31 ENCOUNTER — Encounter: Payer: Self-pay | Admitting: Urology

## 2015-12-31 ENCOUNTER — Ambulatory Visit: Payer: 59 | Admitting: Urology

## 2015-12-31 DIAGNOSIS — R002 Palpitations: Secondary | ICD-10-CM | POA: Diagnosis not present

## 2015-12-31 DIAGNOSIS — I4510 Unspecified right bundle-branch block: Secondary | ICD-10-CM | POA: Insufficient documentation

## 2016-01-03 DIAGNOSIS — I639 Cerebral infarction, unspecified: Secondary | ICD-10-CM | POA: Diagnosis not present

## 2016-01-03 DIAGNOSIS — F329 Major depressive disorder, single episode, unspecified: Secondary | ICD-10-CM | POA: Diagnosis not present

## 2016-01-03 DIAGNOSIS — R079 Chest pain, unspecified: Secondary | ICD-10-CM | POA: Diagnosis not present

## 2016-01-03 DIAGNOSIS — G459 Transient cerebral ischemic attack, unspecified: Secondary | ICD-10-CM | POA: Diagnosis not present

## 2016-01-03 DIAGNOSIS — I6789 Other cerebrovascular disease: Secondary | ICD-10-CM | POA: Diagnosis not present

## 2016-01-03 DIAGNOSIS — R0789 Other chest pain: Secondary | ICD-10-CM | POA: Diagnosis not present

## 2016-01-03 DIAGNOSIS — R4701 Aphasia: Secondary | ICD-10-CM | POA: Diagnosis not present

## 2016-01-07 DIAGNOSIS — R002 Palpitations: Secondary | ICD-10-CM | POA: Diagnosis not present

## 2016-01-14 DIAGNOSIS — R0602 Shortness of breath: Secondary | ICD-10-CM | POA: Diagnosis not present

## 2016-01-14 DIAGNOSIS — R002 Palpitations: Secondary | ICD-10-CM | POA: Diagnosis not present

## 2016-01-18 ENCOUNTER — Encounter: Payer: Self-pay | Admitting: Urology

## 2016-01-18 ENCOUNTER — Ambulatory Visit (INDEPENDENT_AMBULATORY_CARE_PROVIDER_SITE_OTHER): Payer: Medicare Other | Admitting: Urology

## 2016-01-18 VITALS — BP 120/69 | HR 66 | Ht 68.0 in | Wt 178.2 lb

## 2016-01-18 DIAGNOSIS — N401 Enlarged prostate with lower urinary tract symptoms: Secondary | ICD-10-CM | POA: Diagnosis not present

## 2016-01-18 DIAGNOSIS — R351 Nocturia: Secondary | ICD-10-CM

## 2016-01-18 DIAGNOSIS — N400 Enlarged prostate without lower urinary tract symptoms: Secondary | ICD-10-CM | POA: Diagnosis not present

## 2016-01-18 DIAGNOSIS — N138 Other obstructive and reflux uropathy: Secondary | ICD-10-CM

## 2016-01-18 DIAGNOSIS — R42 Dizziness and giddiness: Secondary | ICD-10-CM | POA: Diagnosis not present

## 2016-01-18 DIAGNOSIS — N529 Male erectile dysfunction, unspecified: Secondary | ICD-10-CM

## 2016-01-18 DIAGNOSIS — I4510 Unspecified right bundle-branch block: Secondary | ICD-10-CM | POA: Diagnosis not present

## 2016-01-18 DIAGNOSIS — R002 Palpitations: Secondary | ICD-10-CM | POA: Diagnosis not present

## 2016-01-18 DIAGNOSIS — E782 Mixed hyperlipidemia: Secondary | ICD-10-CM | POA: Diagnosis not present

## 2016-01-18 DIAGNOSIS — I471 Supraventricular tachycardia: Secondary | ICD-10-CM | POA: Diagnosis not present

## 2016-01-18 LAB — BLADDER SCAN AMB NON-IMAGING: Scan Result: 31

## 2016-01-18 MED ORDER — MIRABEGRON ER 25 MG PO TB24: 25.0000 mg | ORAL_TABLET | Freq: Every day | ORAL | 0 refills | Status: DC

## 2016-01-18 NOTE — Progress Notes (Signed)
01/18/2016 9:28 AM   [name] May 24, 1950 [id number]  Referring provider: Gearldine Shown, DO [address] [place], [place] [address]  Chief Complaint  Patient presents with  . Benign Prostatic Hypertrophy    1 year follow up  . Nocturia    HPI: Patient is a 65 year old Caucasian male who presents today for a one year follow up for BPH with LUTS and nocturia.  He is also complaining of erectile dysfunction.  BPH WITH LUTS His IPSS score today is 22, which is severe lower urinary tract symptomatology. He is mixed with his quality life due to his urinary symptoms.  His PVR is 31 mL.  His previous IPSS score was 12/2.  His previous PVR is 22 mL.  His complaints today are frequency, urgency, nocturia x 2-3, incontinence and intermittency.  He was on Cialis 5 mg daily, but his insurance will not cover the medication.  He denies any dysuria, hematuria or suprapubic pain.   He also denies any recent fevers, chills, nausea or vomiting.  He does not have a family history of PCa.      IPSS    Row Name 01/18/16 0900         International Prostate Symptom Score   How often have you had the sensation of not emptying your bladder? More than half the time     How often have you had to urinate less than every two hours? More than half the time     How often have you found you stopped and started again several times when you urinated? Almost always     How often have you found it difficult to postpone urination? About half the time     How often have you had a weak urinary stream? About half the time     How often have you had to strain to start urination? Not at All     How many times did you typically get up at night to urinate? 3 Times     Total IPSS Score 22       Quality of Life due to urinary symptoms   If you were to spend the rest of your life with your urinary condition just the way it is now how would you feel about that? Mixed        Score:  1-7 Mild 8-19  Moderate 20-35 Severe  Erectile dysfunction His SHIM score is 16, which is mild to moderate erectile dysfunction.   He has been having difficulty with erections for the last several years.  His major complaint is lack of firmness with erections.  His libido is preserved.   His risk factors for ED are age, BPH, HLD, anxiety and antidepressants.  He denies any painful erections or curvatures with his erections.   He has tried Cialis in the past, but he finds it cost prohibitive.       SHIM    Row Name 01/18/16 0925         SHIM: Over the last 6 months:   How do you rate your confidence that you could get and keep an erection? Low     When you had erections with sexual stimulation, how often were your erections hard enough for penetration (entering your partner)? Most Times (much more than half the time)     During sexual intercourse, how often were you able to maintain your erection after you had penetrated (entered) your partner? Slightly Difficult  During sexual intercourse, how difficult was it to maintain your erection to completion of intercourse? Slightly Difficult     When you attempted sexual intercourse, how often was it satisfactory for you? Very Difficult       SHIM Total Score   SHIM 16        Score: 1-7 Severe ED 8-11 Moderate ED 12-16 Mild-Moderate ED 17-21 Mild ED 22-25 No ED   Nocturia Patient complains of getting up 2 to 3 times nightly.      PMH: Past Medical History:  Diagnosis Date  . Anxiety   . BPH (benign prostatic hyperplasia)   . Insomnia   . Minimal change disease   . Nocturia     Surgical History: History reviewed. No pertinent surgical history.  Home Medications:    Medication List       Accurate as of 01/18/16 11:59 PM. Always use your most recent med list.          aspirin EC 81 MG tablet Take 81 mg by mouth.   atorvastatin 80 MG tablet Commonly known as:  LIPITOR Take 80 mg by mouth.   busPIRone 15 MG tablet Commonly  known as:  BUSPAR Take by mouth.   clopidogrel 75 MG tablet Commonly known as:  PLAVIX Take 75 mg by mouth.   cycloSPORINE 100 MG capsule Commonly known as:  SANDIMMUNE Take by mouth.   cycloSPORINE modified 100 MG capsule Commonly known as:  NEORAL Take 100 mg by mouth.   escitalopram 20 MG tablet Commonly known as:  LEXAPRO take 1 tablet by mouth once daily MUST HAVE APPT FOR FURTHER REFILLS.   esomeprazole 20 MG capsule Commonly known as:  NEXIUM Take by mouth.   Fish Oil 435 MG Caps Take 1 capsule by mouth daily.   fluticasone 50 MCG/ACT nasal spray Commonly known as:  FLONASE Place 2 sprays into both nostrils daily.   ketoconazole 2 % shampoo Commonly known as:  NIZORAL   loratadine 10 MG tablet Commonly known as:  CLARITIN Take by mouth.   mirabegron ER 25 MG Tb24 tablet Commonly known as:  MYRBETRIQ Take 1 tablet (25 mg total) by mouth daily.   MULTI-VITAMINS Tabs Take by mouth.   multivitamin with minerals tablet Take 1 tablet by mouth daily.   pseudoephedrine-guaifenesin 60-600 MG 12 hr tablet Commonly known as:  MUCINEX D Take by mouth.   tadalafil 5 MG tablet Commonly known as:  CIALIS Take 1 tablet (5 mg total) by mouth daily as needed for erectile dysfunction.   traZODone 50 MG tablet Commonly known as:  DESYREL Take by mouth.       Allergies: No Known Allergies  Family History: Family History  Problem Relation Age of Onset  . Kidney disease Neg Hx   . Prostate cancer Neg Hx     Social History:  reports that he has never smoked. He has never used smokeless tobacco. He reports that he drinks about 1.8 oz of alcohol per week . He reports that he does not use drugs.  ROS: UROLOGY Frequent Urination?: Yes Hard to postpone urination?: Yes Burning/pain with urination?: No Get up at night to urinate?: Yes Leakage of urine?: Yes Urine stream starts and stops?: Yes Trouble starting stream?: No Do you have to strain to urinate?:  No Blood in urine?: No Urinary tract infection?: No Sexually transmitted disease?: No Injury to kidneys or bladder?: No Painful intercourse?: No Weak stream?: No Erection problems?: No Penile pain?: No  Gastrointestinal Nausea?:  No Vomiting?: No Indigestion/heartburn?: No Diarrhea?: No Constipation?: No  Constitutional Fever: No Night sweats?: No Weight loss?: No Fatigue?: No  Skin Skin rash/lesions?: No Itching?: No  Eyes Blurred vision?: No Double vision?: No  Ears/Nose/Throat Sore throat?: No Sinus problems?: No  Hematologic/Lymphatic Swollen glands?: No Easy bruising?: No  Cardiovascular Leg swelling?: No Chest pain?: No  Respiratory Cough?: No Shortness of breath?: No  Endocrine Excessive thirst?: No  Musculoskeletal Back pain?: No Joint pain?: No  Neurological Headaches?: No Dizziness?: No  Psychologic Depression?: No Anxiety?: No  Physical Exam: BP 120/69   Pulse 66   Ht 5\' 8"  (1.727 m)   Wt 178 lb 3.2 oz (80.8 kg)   BMI 27.10 kg/m   Constitutional: Well nourished. Alert and oriented, No acute distress. HEENT: [place] AT, moist mucus membranes. Trachea midline, no masses. Cardiovascular: No clubbing, cyanosis, or edema. Respiratory: Normal respiratory effort, no increased work of breathing. GI: Abdomen is soft, non tender, non distended, no abdominal masses. Liver and spleen not palpable.  No hernias appreciated.  Stool sample for occult testing is not indicated.   GU: No CVA tenderness.  No bladder fullness or masses.  Patient with circumcised phallus.  Urethral meatus is patent.  No penile discharge. No penile lesions or rashes. Scrotum without lesions, cysts, rashes and/or edema.  Testicles are located scrotally bilaterally. No masses are appreciated in the testicles. Left and right epididymis are normal. Rectal: Patient with  normal sphincter tone. Anus and perineum without scarring or rashes. No rectal masses are appreciated.  Prostate is approximately 45 grams, right > left lobe, no nodules are appreciated. Seminal vesicles are normal. Skin: No rashes, bruises or suspicious lesions. Lymph: No cervical or inguinal adenopathy. Neurologic: Grossly intact, no focal deficits, moving all 4 extremities. Psychiatric: Normal mood and affect.  Laboratory Data: Lab Results  Component Value Date   WBC 5.3 11/26/2015   HGB 14.3 11/26/2015   HCT 40.6 11/26/2015   MCV 91.8 11/26/2015   PLT 213 11/26/2015    Lab Results  Component Value Date   CREATININE 1.14 11/26/2015    PSA History  0.8 ng/mL on 10/30/2014   Pertinent Imaging: Results for JAVARIAN, WIZNER (MRN [id number]) as of 01/18/2016 09:43  Ref. Range 01/18/2016 09:42  Scan Result Unknown 31   Assessment & Plan:    1. BPH with LUTS  - IPSS score is 22/3, it is worsening  - Continue conservative management, avoiding bladder irritants and timed voiding's  - start Myrbetriq 25 mg daily, # 28 samples given  - RTC in 3 weeks for IPSS and PVR   - BLADDER SCAN AMB NON-IMAGING  2. Nocturia  - I explained to the patient that nocturia is often multi-factorial and difficult to treat.  Sleeping disorders, heart conditions, peripheral vascular disease, diabetes, an enlarged prostate for men, an urethral stricture causing bladder outlet obstruction and/or certain medications can contribute to nocturia.  - I have suggested that the patient avoid caffeine after noon and alcohol in the evening.  He or she may also benefit from fluid restrictions after 6:00 in the evening and voiding just prior to bedtime.  - I have explained that research studies have showed that over 84% of patients with sleep apnea reported frequent nighttime urination.   With sleep apnea, oxygen decreases, carbon dioxide increases, the blood become more acidic, the heart rate drops and blood vessels in the lung constrict.  The body is then alerted that something is very wrong. The sleeper must  wake enough  to reopen the airway. By this time, the heart is racing and experiences a false signal of fluid overload. The heart excretes a hormone-like protein that tells the body to get rid of sodium and water, resulting in nocturia.  -  I also informed the patient that a recent study noted that decreasing sodium intake to 2.3 grams daily, if they don't have issues with hyponatremia, can also reduce the number of nightly voids  - The patient may benefit from a discussion with his or her primary care physician to see if he or she has risk factors for sleep apnea or other sleep disturbances and obtaining a sleep study.  - Patient will also be given a trial of Myrbetriq 25 mg  3. Erectile dysfunction  - SHIM score is 16  - I explained to the patient that in order to achieve an erection it takes good functioning of the nervous system (parasympathetic, sympathetic, sensory and motor), good blood flow into the erectile tissue of the penis and a desire to have sex  - I explained that conditions like diabetes, hypertension, coronary artery disease, peripheral vascular disease, smoking, alcohol consumption, age, sleep apnea and BPH can diminish the ability to have an erection  - We discussed trying a different PDE5 inhibitor, intra-urethral suppositories, intracavernous vasoactive drug injection therapy and/or vacuum constriction device  - He would like to try Stendra 200 mg, # 3 samples given  - Repeat SHIM in 3 weeks  Return in about 3 weeks (around 02/08/2016) for SHIM, IPSS and PVR.  [name], [facility] [address] [place], [place] [address] [phone]

## 2016-01-18 NOTE — Patient Instructions (Addendum)
Avanafil oral tablets What is this medicine? AVANAFIL (ah VA Na fil) is used to treat erection problems in men. This medicine may be used for other purposes; ask your health care provider or pharmacist if you have questions. What should I tell my health care provider before I take this medicine? They need to know if you have any of these conditions:bleeding disorders -eye or vision problems, including a rare inherited eye disease called retinitis pigmentosa -anatomical deformation of the penis, Peyronie's disease, or history of priapism (painful and prolonged erection) -heart disease, angina, a history of heart attack, irregular heart beats, or other heart problems -high or low blood pressure -history of blood diseases, like sickle cell anemia or leukemia -history of stomach bleeding -kidney disease -liver disease -stroke -an unusual or allergic reaction to avanafil, other medicines, foods, dyes, or preservatives -pregnant or trying to get pregnant -breast-feeding How should I use this medicine? Take this medicine by mouth with a glass of water. Follow the directions on the prescription label. The dose is taken 15 to 30 minutes before sexual activity, depending on the dose you are being prescribed. You should not take the dose more than once per day. Do not take your medicine more often than directed. Talk to your pediatrician regarding the use of this medicine in children. This medicine is not used in children for this condition. Overdosage: If you think you have taken too much of this medicine contact a poison control center or emergency room at once. NOTE: This medicine is only for you. Do not share this medicine with others. What if I miss a dose? This does not apply. Do not take double or extra doses. What may interact with this medicine? Do not take this medicine with any of the following medications: -methscopolamine nitrate -nitrates like amyl nitrite, isosorbide dinitrate,  isosorbide mononitrate, nitroglycerin -other medicines for erectile dysfunction like sildenafil, tadalafil, vardenafil -riociguat This medicine may also interact with the following medications: -carbamazepine -certain drugs for high blood pressure -certain drugs for the treatment of HIV infection or AIDS -certain drugs used for fungal or yeast infections, like fluconazole, itraconazole, ketoconazole, and voriconazole -grapefruit juice -macrolide antibiotics like clarithromycin, erythromycin, troleandomycin -medicines for prostate problems -phenobarbital -phenytoin -rifabutin, rifampin or rifapentine -St. John's wort This list may not describe all possible interactions. Give your health care provider a list of all the medicines, herbs, non-prescription drugs, or dietary supplements you use. Also tell them if you smoke, drink alcohol, or use illegal drugs. Some items may interact with your medicine. What should I watch for while using this medicine? If you notice any changes in your vision while taking this drug, call your doctor or health care professional as soon as possible. Stop using this medicine and call your health care provider right away if you have a loss of sight in one or both eyes. Contact your doctor or health care professional right away if you have an erection that lasts longer than 4 hours or if it becomes painful. This may be a sign of a serious problem and must be treated right away to prevent permanent damage. If you experience symptoms of nausea, dizziness, chest pain or arm pain upon initiation of sexual activity after taking this medicine, you should refrain from further activity and call your doctor or health care professional as soon as possible. Do not drink alcohol to excess (examples, 5 glasses of wine or 5 shots of whiskey) when taking this medicine. When taken in excess, alcohol can increase  your chances of getting a headache or getting dizzy, increasing your heart  rate or lowering your blood pressure. Using this medicine does not protect you or your partner against HIV infection (the virus that causes AIDS) or other sexually transmitted diseases. What side effects may I notice from receiving this medicine? Side effects that you should report to your doctor or health care professional as soon as possible: -allergic reactions like skin rash, itching or hives, swelling of the face, lips, or tongue -breathing problems -changes in hearing -changes in vision -chest pain -fast, irregular heartbeat -prolonged or painful erection -seizures Side effects that usually do not require medical attention (report to your doctor or health care professional if they continue or are bothersome): -back pain -dizziness -flushing -headache -indigestion -muscle aches -nausea -stuffy or runny nose This list may not describe all possible side effects. Call your doctor for medical advice about side effects. You may report side effects to FDA at 1-800-FDA-1088. Where should I keep my medicine? Keep out of the reach of children. Store at room temperature between 20 and 30 degrees C (68 and 86 degrees F). Protect from light. Throw away any unused medicine after the expiration date. NOTE: This sheet is a summary. It may not cover all possible information. If you have questions about this medicine, talk to your doctor, pharmacist, or health care provider.    2016, Elsevier/Gold Standard. (2013-07-19 13:20:28) Mirabegron extended-release tablets What is this medicine? MIRABEGRON (MIR a BEG ron) is used to treat overactive bladder. This medicine reduces the amount of bathroom visits. It may also help to control wetting accidents. This medicine may be used for other purposes; ask your health care provider or pharmacist if you have questions. What should I tell my health care provider before I take this medicine? They need to know if you have any of these  conditions: -difficulty passing urine -high blood pressure -kidney disease -liver disease -an unusual or allergic reaction to mirabegron, other medicines, foods, dyes, or preservatives -pregnant or trying to get pregnant -breast-feeding How should I use this medicine? Take this medicine by mouth with a glass of water. Follow the directions on the prescription label. Do not cut, crush or chew this medicine. You can take it with or without food. If it upsets your stomach, take it with food. Take your medicine at regular intervals. Do not take it more often than directed. Do not stop taking except on your doctor's advice. Talk to your pediatrician regarding the use of this medicine in children. Special care may be needed. Overdosage: If you think you have taken too much of this medicine contact a poison control center or emergency room at once. NOTE: This medicine is only for you. Do not share this medicine with others. What if I miss a dose? If you miss a dose, take it as soon as you can. If it is almost time for your next dose, take only that dose. Do not take double or extra doses. What may interact with this medicine? -certain medicines for bladder problems like fesoterodine, oxybutynin, solifenacin, tolterodine -desipramine -digoxin -flecainide -ketoconazole -MAOIs like Carbex, Eldepryl, Marplan, Nardil, and Parnate -metoprolol -propafenone -thioridazine -warfarin This list may not describe all possible interactions. Give your health care provider a list of all the medicines, herbs, non-prescription drugs, or dietary supplements you use. Also tell them if you smoke, drink alcohol, or use illegal drugs. Some items may interact with your medicine. What should I watch for while using this medicine? It  may take 8 weeks to notice the full benefit from this medicine. You may need to limit your intake tea, coffee, caffeinated sodas, and alcohol. These drinks may make your symptoms  worse. Visit your doctor or health care professional for regular checks on your progress. Check your blood pressure as directed. Ask your doctor or health care professional what your blood pressure should be and when you should contact him or her. What side effects may I notice from receiving this medicine? Side effects that you should report to your doctor or health care professional as soon as possible: -allergic reactions like skin rash, itching or hives, swelling of the face, lips, or tongue -chest pain or palpitations -severe or sudden headache -high blood pressure -fast, irregular heartbeat -redness, blistering, peeling or loosening of the skin, including inside the mouth -signs of infection like fever or chills; cough; sore throat; pain or difficulty passing urine -trouble passing urine or change in the amount of urine Side effects that usually do not require medical attention (Report these to your doctor or health care professional if they continue or are bothersome.): -constipation -diarrhea -dizziness -dry eyes -joint pain -mild headache -nausea -runny nose This list may not describe all possible side effects. Call your doctor for medical advice about side effects. You may report side effects to FDA at 1-800-FDA-1088. Where should I keep my medicine? Keep out of the reach of children. Store at room temperature between 15 and 30 degrees C (59 and 86 degrees F). Throw away any unused medicine after the expiration date. NOTE: This sheet is a summary. It may not cover all possible information. If you have questions about this medicine, talk to your doctor, pharmacist, or health care provider.    2016, Elsevier/Gold Standard. (2014-10-30 10:22:20)

## 2016-01-19 ENCOUNTER — Telehealth: Payer: Self-pay

## 2016-01-19 LAB — PSA: PROSTATE SPECIFIC AG, SERUM: 1.1 ng/mL (ref 0.0–4.0)

## 2016-01-19 NOTE — Telephone Encounter (Signed)
Spoke with pt in reference to PSA results. Pt voiced understanding.  

## 2016-01-19 NOTE — Telephone Encounter (Signed)
-----   Message from Nori Riis, PA-C sent at 01/19/2016  8:37 AM EST ----- PSA is normal.

## 2016-02-08 ENCOUNTER — Ambulatory Visit (INDEPENDENT_AMBULATORY_CARE_PROVIDER_SITE_OTHER): Payer: Medicare Other | Admitting: Urology

## 2016-02-08 ENCOUNTER — Encounter: Payer: Self-pay | Admitting: Urology

## 2016-02-08 VITALS — BP 116/79 | HR 53 | Ht 69.0 in | Wt 182.7 lb

## 2016-02-08 DIAGNOSIS — N401 Enlarged prostate with lower urinary tract symptoms: Secondary | ICD-10-CM | POA: Diagnosis not present

## 2016-02-08 DIAGNOSIS — R351 Nocturia: Secondary | ICD-10-CM

## 2016-02-08 DIAGNOSIS — N400 Enlarged prostate without lower urinary tract symptoms: Secondary | ICD-10-CM

## 2016-02-08 DIAGNOSIS — N529 Male erectile dysfunction, unspecified: Secondary | ICD-10-CM

## 2016-02-08 DIAGNOSIS — N138 Other obstructive and reflux uropathy: Secondary | ICD-10-CM | POA: Diagnosis not present

## 2016-02-08 LAB — BLADDER SCAN AMB NON-IMAGING: Scan Result: 104

## 2016-02-08 MED ORDER — MIRABEGRON ER 25 MG PO TB24: 25.0000 mg | ORAL_TABLET | Freq: Every day | ORAL | 12 refills | Status: DC

## 2016-02-08 NOTE — Progress Notes (Signed)
02/08/2016 11:32 AM   Matthew Maldonado 1951-02-16 [id number]  Referring provider: Gearldine Shown, DO [facility] [place], [place] [address]  Chief Complaint  Patient presents with  . Benign Prostatic Hypertrophy    3 week follow up  . Nocturia    HPI: Patient is a 65 year old Caucasian male who presents today for a three week follow up after starting Myrbetriq for BPH with LUTS and nocturia and Strenda for erectile dysfunction.  BPH WITH LUTS His IPSS score today is 7, which is mild lower urinary tract symptomatology. He is pleased with his quality life due to his urinary symptoms.  His PVR is 31 mL.  His previous IPSS score was 22/3.  His previous PVR is 31 mL.  His complaints at his last visit were frequency, urgency, nocturia x 2-3, incontinence and intermittency.  He feels that his symptoms are controlled with Myrbetriq 25 mg daily.  He was on Cialis 5 mg daily, but his insurance will not cover the medication.  He denies any dysuria, hematuria or suprapubic pain.   He also denies any recent fevers, chills, nausea or vomiting.  He does not have a family history of PCa.      IPSS    Row Name 01/18/16 0900 02/08/16 1100       International Prostate Symptom Score   How often have you had the sensation of not emptying your bladder? More than half the time Less than half the time    How often have you had to urinate less than every two hours? More than half the time Less than 1 in 5 times    How often have you found you stopped and started again several times when you urinated? Almost always Less than half the time    How often have you found it difficult to postpone urination? About half the time Less than 1 in 5 times    How often have you had a weak urinary stream? About half the time Not at All    How often have you had to strain to start urination? Not at All Not at All    How many times did you typically get up at night to urinate? 3 Times 1 Time    Total IPSS Score  22 7      Quality of Life due to urinary symptoms   If you were to spend the rest of your life with your urinary condition just the way it is now how would you feel about that? Mixed Pleased       Score:  1-7 Mild 8-19 Moderate 20-35 Severe  Erectile dysfunction His SHIM score is 16, which is mild to moderate erectile dysfunction.   He has been having difficulty with erections for the last several years.  His major complaint is lack of firmness with erections.  His libido is preserved.   His risk factors for ED are age, BPH, HLD, anxiety and antidepressants.  He denies any painful erections or curvatures with his erections.   He has tried Cialis in the past, but he finds it cost prohibitive.       SHIM    Row Name 01/18/16 0925         SHIM: Over the last 6 months:   How do you rate your confidence that you could get and keep an erection? Low     When you had erections with sexual stimulation, how often were your erections hard enough for penetration (entering  your partner)? Most Times (much more than half the time)     During sexual intercourse, how often were you able to maintain your erection after you had penetrated (entered) your partner? Slightly Difficult     During sexual intercourse, how difficult was it to maintain your erection to completion of intercourse? Slightly Difficult     When you attempted sexual intercourse, how often was it satisfactory for you? Very Difficult       SHIM Total Score   SHIM 16        Score: 1-7 Severe ED 8-11 Moderate ED 12-16 Mild-Moderate ED 17-21 Mild ED 22-25 No ED   Nocturia Patient complains of getting up 2 to 3 times nightly.   He is now getting up once a night.       PMH: Past Medical History:  Diagnosis Date  . Anxiety   . BPH (benign prostatic hyperplasia)   . Insomnia   . Minimal change disease   . Nocturia     Surgical History: History reviewed. No pertinent surgical history.  Home Medications:      Medication List       Accurate as of 02/08/16 11:32 AM. Always use your most recent med list.          aspirin EC 81 MG tablet Take 81 mg by mouth.   atorvastatin 80 MG tablet Commonly known as:  LIPITOR Take 80 mg by mouth.   busPIRone 15 MG tablet Commonly known as:  BUSPAR Take by mouth.   clopidogrel 75 MG tablet Commonly known as:  PLAVIX Take 75 mg by mouth.   co-enzyme Q-10 30 MG capsule Take 30 mg by mouth 3 (three) times daily.   cycloSPORINE 100 MG capsule Commonly known as:  SANDIMMUNE Take by mouth.   cycloSPORINE modified 100 MG capsule Commonly known as:  NEORAL Take 100 mg by mouth.   escitalopram 20 MG tablet Commonly known as:  LEXAPRO take 1 tablet by mouth once daily MUST HAVE APPT FOR FURTHER REFILLS.   esomeprazole 20 MG capsule Commonly known as:  NEXIUM Take by mouth.   Fish Oil 435 MG Caps Take 1 capsule by mouth daily.   fluticasone 50 MCG/ACT nasal spray Commonly known as:  FLONASE Place 2 sprays into both nostrils daily.   ketoconazole 2 % shampoo Commonly known as:  NIZORAL   loratadine 10 MG tablet Commonly known as:  CLARITIN Take by mouth.   metoprolol succinate 25 MG 24 hr tablet Commonly known as:  TOPROL-XL Take by mouth.   mirabegron ER 25 MG Tb24 tablet Commonly known as:  MYRBETRIQ Take 1 tablet (25 mg total) by mouth daily.   MULTI-VITAMINS Tabs Take by mouth.   multivitamin with minerals tablet Take 1 tablet by mouth daily.   pseudoephedrine-guaifenesin 60-600 MG 12 hr tablet Commonly known as:  MUCINEX D Take by mouth.   tadalafil 5 MG tablet Commonly known as:  CIALIS Take 1 tablet (5 mg total) by mouth daily as needed for erectile dysfunction.   traZODone 50 MG tablet Commonly known as:  DESYREL Take by mouth.       Allergies: No Known Allergies  Family History: Family History  Problem Relation Age of Onset  . Kidney disease Neg Hx   . Prostate cancer Neg Hx     Social History:   reports that he has never smoked. He has never used smokeless tobacco. He reports that he drinks about 1.8 oz of alcohol per week . He  reports that he does not use drugs.  ROS: UROLOGY Frequent Urination?: No Hard to postpone urination?: No Burning/pain with urination?: No Get up at night to urinate?: No Leakage of urine?: No Urine stream starts and stops?: No Trouble starting stream?: No Do you have to strain to urinate?: No Blood in urine?: No Urinary tract infection?: No Sexually transmitted disease?: No Injury to kidneys or bladder?: No Painful intercourse?: No Weak stream?: No Erection problems?: No Penile pain?: No  Gastrointestinal Nausea?: No Vomiting?: No Indigestion/heartburn?: No Diarrhea?: No Constipation?: No  Constitutional Fever: No Night sweats?: No Weight loss?: No Fatigue?: No  Skin Skin rash/lesions?: No Itching?: No  Eyes Blurred vision?: No Double vision?: No  Ears/Nose/Throat Sore throat?: No Sinus problems?: No  Hematologic/Lymphatic Swollen glands?: No Easy bruising?: No  Cardiovascular Leg swelling?: No Chest pain?: No  Respiratory Cough?: No Shortness of breath?: No  Endocrine Excessive thirst?: No  Musculoskeletal Back pain?: No Joint pain?: No  Neurological Headaches?: No Dizziness?: No  Psychologic Depression?: No Anxiety?: No  Physical Exam: BP 116/79   Pulse (!) 53   Ht 5\' 9"  (1.753 m)   Wt 182 lb 11.2 oz (82.9 kg)   BMI 26.98 kg/m   Constitutional: Well nourished. Alert and oriented, No acute distress. HEENT: [place] AT, moist mucus membranes. Trachea midline, no masses. Cardiovascular: No clubbing, cyanosis, or edema. Respiratory: Normal respiratory effort, no increased work of breathing. Skin: No rashes, bruises or suspicious lesions. Lymph: No cervical or inguinal adenopathy. Neurologic: Grossly intact, no focal deficits, moving all 4 extremities. Psychiatric: Normal mood and affect.  Laboratory  Data: Lab Results  Component Value Date   WBC 5.3 11/26/2015   HGB 14.3 11/26/2015   HCT 40.6 11/26/2015   MCV 91.8 11/26/2015   PLT 213 11/26/2015    Lab Results  Component Value Date   CREATININE 1.14 11/26/2015    PSA History  0.8 ng/mL on 10/30/2014  1.1 ng/mL on 01/18/2016   Pertinent Imaging: Results for ROLIN, KOCHANOWSKI (MRN [id number]) as of 02/08/2016 11:28  Ref. Range 02/08/2016 11:22  Scan Result Unknown 104    Assessment & Plan:    1. BPH with LUTS  - IPSS score is 7/1, it is improving  - Continue conservative management, avoiding bladder irritants and timed voiding's  - continue Myrbetriq 25 mg daily; refills given  - RTC in one for IPSS and PVR   - BLADDER SCAN AMB NON-IMAGING  2. Nocturia  - Patient found the Myrbetriq effective  3. Erectile dysfunction  - SHIM score is 16  - I explained to the patient that in order to achieve an erection it takes good functioning of the nervous system (parasympathetic, sympathetic, sensory and motor), good blood flow into the erectile tissue of the penis and a desire to have sex  - I explained that conditions like diabetes, hypertension, coronary artery disease, peripheral vascular disease, smoking, alcohol consumption, age, sleep apnea and BPH can diminish the ability to have an erection  - We discussed trying a different PDE5 inhibitor, intra-urethral suppositories, intracavernous vasoactive drug injection therapy and/or vacuum constriction device  - He would like to try Stendra 200 mg, # 3 samples given- he has not tried the samples   - Repeat SHIM in one year  Return in about 1 year (around 02/07/2017) for IPSS, SHIM, PVR and exam.  [name], [facility]  [place] [address] [place], [place] [address] [phone]

## 2016-02-24 DIAGNOSIS — E782 Mixed hyperlipidemia: Secondary | ICD-10-CM | POA: Diagnosis not present

## 2016-02-24 DIAGNOSIS — M79601 Pain in right arm: Secondary | ICD-10-CM | POA: Diagnosis not present

## 2016-02-24 DIAGNOSIS — I471 Supraventricular tachycardia: Secondary | ICD-10-CM | POA: Diagnosis not present

## 2016-02-25 DIAGNOSIS — M79601 Pain in right arm: Secondary | ICD-10-CM | POA: Insufficient documentation

## 2016-03-01 DIAGNOSIS — Z08 Encounter for follow-up examination after completed treatment for malignant neoplasm: Secondary | ICD-10-CM | POA: Diagnosis not present

## 2016-03-01 DIAGNOSIS — Z1283 Encounter for screening for malignant neoplasm of skin: Secondary | ICD-10-CM | POA: Diagnosis not present

## 2016-03-01 DIAGNOSIS — S30861A Insect bite (nonvenomous) of abdominal wall, initial encounter: Secondary | ICD-10-CM | POA: Diagnosis not present

## 2016-03-01 DIAGNOSIS — S20369A Insect bite (nonvenomous) of unspecified front wall of thorax, initial encounter: Secondary | ICD-10-CM | POA: Diagnosis not present

## 2016-03-01 DIAGNOSIS — Z85828 Personal history of other malignant neoplasm of skin: Secondary | ICD-10-CM | POA: Diagnosis not present

## 2016-03-01 DIAGNOSIS — Z09 Encounter for follow-up examination after completed treatment for conditions other than malignant neoplasm: Secondary | ICD-10-CM | POA: Diagnosis not present

## 2016-03-01 DIAGNOSIS — Z872 Personal history of diseases of the skin and subcutaneous tissue: Secondary | ICD-10-CM | POA: Diagnosis not present

## 2016-03-01 DIAGNOSIS — D485 Neoplasm of uncertain behavior of skin: Secondary | ICD-10-CM | POA: Diagnosis not present

## 2016-03-01 DIAGNOSIS — L570 Actinic keratosis: Secondary | ICD-10-CM | POA: Diagnosis not present

## 2016-03-24 DIAGNOSIS — I498 Other specified cardiac arrhythmias: Secondary | ICD-10-CM | POA: Diagnosis not present

## 2016-03-24 DIAGNOSIS — Z6827 Body mass index (BMI) 27.0-27.9, adult: Secondary | ICD-10-CM | POA: Diagnosis not present

## 2016-03-24 DIAGNOSIS — E782 Mixed hyperlipidemia: Secondary | ICD-10-CM | POA: Diagnosis not present

## 2016-03-24 DIAGNOSIS — G451 Carotid artery syndrome (hemispheric): Secondary | ICD-10-CM | POA: Insufficient documentation

## 2016-04-01 DIAGNOSIS — N040 Nephrotic syndrome with minor glomerular abnormality: Secondary | ICD-10-CM | POA: Diagnosis not present

## 2016-04-01 DIAGNOSIS — M19049 Primary osteoarthritis, unspecified hand: Secondary | ICD-10-CM | POA: Diagnosis not present

## 2016-04-01 DIAGNOSIS — Z8673 Personal history of transient ischemic attack (TIA), and cerebral infarction without residual deficits: Secondary | ICD-10-CM | POA: Diagnosis not present

## 2016-04-01 DIAGNOSIS — F419 Anxiety disorder, unspecified: Secondary | ICD-10-CM | POA: Diagnosis not present

## 2016-04-01 DIAGNOSIS — L989 Disorder of the skin and subcutaneous tissue, unspecified: Secondary | ICD-10-CM | POA: Diagnosis not present

## 2016-04-01 DIAGNOSIS — R42 Dizziness and giddiness: Secondary | ICD-10-CM | POA: Diagnosis not present

## 2016-04-02 DIAGNOSIS — Z8673 Personal history of transient ischemic attack (TIA), and cerebral infarction without residual deficits: Secondary | ICD-10-CM | POA: Insufficient documentation

## 2016-04-14 DIAGNOSIS — N049 Nephrotic syndrome with unspecified morphologic changes: Secondary | ICD-10-CM | POA: Diagnosis not present

## 2016-06-15 DIAGNOSIS — B354 Tinea corporis: Secondary | ICD-10-CM | POA: Diagnosis not present

## 2016-06-28 DIAGNOSIS — R001 Bradycardia, unspecified: Secondary | ICD-10-CM | POA: Insufficient documentation

## 2016-06-28 DIAGNOSIS — I471 Supraventricular tachycardia: Secondary | ICD-10-CM | POA: Diagnosis not present

## 2016-06-29 DIAGNOSIS — L920 Granuloma annulare: Secondary | ICD-10-CM | POA: Diagnosis not present

## 2016-06-29 DIAGNOSIS — R21 Rash and other nonspecific skin eruption: Secondary | ICD-10-CM | POA: Diagnosis not present

## 2016-06-29 DIAGNOSIS — B354 Tinea corporis: Secondary | ICD-10-CM | POA: Diagnosis not present

## 2016-07-14 DIAGNOSIS — L920 Granuloma annulare: Secondary | ICD-10-CM | POA: Diagnosis not present

## 2016-08-04 DIAGNOSIS — R21 Rash and other nonspecific skin eruption: Secondary | ICD-10-CM | POA: Diagnosis not present

## 2016-08-04 DIAGNOSIS — R5383 Other fatigue: Secondary | ICD-10-CM | POA: Diagnosis not present

## 2016-08-04 DIAGNOSIS — Z79899 Other long term (current) drug therapy: Secondary | ICD-10-CM | POA: Diagnosis not present

## 2016-08-04 DIAGNOSIS — Z7951 Long term (current) use of inhaled steroids: Secondary | ICD-10-CM | POA: Diagnosis not present

## 2016-08-04 DIAGNOSIS — F17290 Nicotine dependence, other tobacco product, uncomplicated: Secondary | ICD-10-CM | POA: Diagnosis not present

## 2016-08-04 DIAGNOSIS — K219 Gastro-esophageal reflux disease without esophagitis: Secondary | ICD-10-CM | POA: Diagnosis not present

## 2016-08-04 DIAGNOSIS — Z7982 Long term (current) use of aspirin: Secondary | ICD-10-CM | POA: Diagnosis not present

## 2016-08-04 DIAGNOSIS — R809 Proteinuria, unspecified: Secondary | ICD-10-CM | POA: Diagnosis not present

## 2016-08-04 DIAGNOSIS — N040 Nephrotic syndrome with minor glomerular abnormality: Secondary | ICD-10-CM | POA: Diagnosis not present

## 2016-08-04 DIAGNOSIS — F419 Anxiety disorder, unspecified: Secondary | ICD-10-CM | POA: Diagnosis not present

## 2016-12-08 DIAGNOSIS — Z23 Encounter for immunization: Secondary | ICD-10-CM | POA: Diagnosis not present

## 2016-12-13 DIAGNOSIS — F419 Anxiety disorder, unspecified: Secondary | ICD-10-CM | POA: Diagnosis not present

## 2016-12-13 DIAGNOSIS — K219 Gastro-esophageal reflux disease without esophagitis: Secondary | ICD-10-CM | POA: Diagnosis not present

## 2016-12-13 DIAGNOSIS — M25552 Pain in left hip: Secondary | ICD-10-CM | POA: Diagnosis not present

## 2017-01-10 DIAGNOSIS — L089 Local infection of the skin and subcutaneous tissue, unspecified: Secondary | ICD-10-CM | POA: Diagnosis not present

## 2017-01-10 DIAGNOSIS — T148XXA Other injury of unspecified body region, initial encounter: Secondary | ICD-10-CM | POA: Diagnosis not present

## 2017-01-10 DIAGNOSIS — L97909 Non-pressure chronic ulcer of unspecified part of unspecified lower leg with unspecified severity: Secondary | ICD-10-CM | POA: Diagnosis not present

## 2017-01-10 DIAGNOSIS — Z23 Encounter for immunization: Secondary | ICD-10-CM | POA: Diagnosis not present

## 2017-01-17 DIAGNOSIS — L920 Granuloma annulare: Secondary | ICD-10-CM | POA: Diagnosis not present

## 2017-01-25 DIAGNOSIS — L920 Granuloma annulare: Secondary | ICD-10-CM | POA: Diagnosis not present

## 2017-01-31 ENCOUNTER — Other Ambulatory Visit: Payer: Medicare Other

## 2017-01-31 ENCOUNTER — Other Ambulatory Visit: Payer: Self-pay

## 2017-01-31 DIAGNOSIS — N401 Enlarged prostate with lower urinary tract symptoms: Secondary | ICD-10-CM

## 2017-02-01 LAB — PSA: PROSTATE SPECIFIC AG, SERUM: 1.1 ng/mL (ref 0.0–4.0)

## 2017-02-06 NOTE — Progress Notes (Deleted)
02/07/2017 8:21 AM   Matthew Maldonado 1950-06-29 [id number]  Referring provider: Gearldine Shown, [address] [place], [place] [address]  No chief complaint on file.   HPI: Patient is a 66 year old Caucasian male with BPH with LU TS, ED and nocturia who presents today for a one year follow up.    BPH WITH LUTS His IPSS score today is ***, which is *** lower urinary tract symptomatology. He is *** with his quality life due to his urinary symptoms.  His PVR is *** mL.  His previous IPSS score was 7/1.  His previous PVR is 31 mL.  His major complaints at this time are *** He feels that his symptoms are controlled with Myrbetriq 25 mg daily.  He was on Cialis 5 mg daily, but his insurance will not cover the medication.  He denies any dysuria, hematuria or suprapubic pain.   He also denies any recent fevers, chills, nausea or vomiting.  He does not have a family history of PCa.    Score:  1-7 Mild 8-19 Moderate 20-35 Severe  Erectile dysfunction His SHIM score is ***, which is *** erectile dysfunction.   His previous SHIM score was 16.  He has been having difficulty with erections for the last several years.  His major complaint is lack of firmness with erections.  His libido is preserved.   His risk factors for ED are age, BPH, HLD, anxiety and antidepressants.  He denies any painful erections or curvatures with his erections.   He has tried Cialis in the past, but he finds it cost prohibitive.     Score: 1-7 Severe ED 8-11 Moderate ED 12-16 Mild-Moderate ED 17-21 Mild ED 22-25 No ED   Nocturia Patient complains of getting up 2 to 3 times nightly.   He is now getting up once a night.       PMH: Past Medical History:  Diagnosis Date  . Anxiety   . BPH (benign prostatic hyperplasia)   . Insomnia   . Minimal change disease   . Nocturia     Surgical History: No past surgical history on file.  Home Medications:  Allergies as of 02/07/2017   No Known  Allergies     Medication List        Accurate as of 02/06/17  8:21 AM. Always use your most recent med list.          atorvastatin 80 MG tablet Commonly known as:  LIPITOR Take 80 mg by mouth.   busPIRone 15 MG tablet Commonly known as:  BUSPAR Take by mouth.   co-enzyme Q-10 30 MG capsule Take 30 mg by mouth 3 (three) times daily.   cycloSPORINE 100 MG capsule Commonly known as:  SANDIMMUNE Take by mouth.   cycloSPORINE modified 100 MG capsule Commonly known as:  NEORAL Take 100 mg by mouth.   escitalopram 20 MG tablet Commonly known as:  LEXAPRO take 1 tablet by mouth once daily MUST HAVE APPT FOR FURTHER REFILLS.   esomeprazole 20 MG capsule Commonly known as:  NEXIUM Take by mouth.   Fish Oil 435 MG Caps Take 1 capsule by mouth daily.   fluticasone 50 MCG/ACT nasal spray Commonly known as:  FLONASE Place 2 sprays into both nostrils daily.   ketoconazole 2 % shampoo Commonly known as:  NIZORAL   loratadine 10 MG tablet Commonly known as:  CLARITIN Take by mouth.   metoprolol succinate 25 MG 24 hr tablet Commonly known as:  TOPROL-XL Take by mouth.   mirabegron ER 25 MG Tb24 tablet Commonly known as:  MYRBETRIQ Take 1 tablet (25 mg total) by mouth daily.   MULTI-VITAMINS Tabs Take by mouth.   multivitamin with minerals tablet Take 1 tablet by mouth daily.   pseudoephedrine-guaifenesin 60-600 MG 12 hr tablet Commonly known as:  MUCINEX D Take by mouth.   tadalafil 5 MG tablet Commonly known as:  CIALIS Take 1 tablet (5 mg total) by mouth daily as needed for erectile dysfunction.   traZODone 50 MG tablet Commonly known as:  DESYREL Take by mouth.       Allergies: No Known Allergies  Family History: Family History  Problem Relation Age of Onset  . Kidney disease Neg Hx   . Prostate cancer Neg Hx     Social History:  reports that  has never smoked. he has never used smokeless tobacco. He reports that he drinks about 1.8 oz of  alcohol per week. He reports that he does not use drugs.  ROS:                                        Physical Exam: There were no vitals taken for this visit.  Constitutional: Well nourished. Alert and oriented, No acute distress. HEENT: [place] AT, moist mucus membranes. Trachea midline, no masses. Cardiovascular: No clubbing, cyanosis, or edema. Respiratory: Normal respiratory effort, no increased work of breathing. GI: Abdomen is soft, non tender, non distended, no abdominal masses. Liver and spleen not palpable.  No hernias appreciated.  Stool sample for occult testing is not indicated.   GU: No CVA tenderness.  No bladder fullness or masses.  Patient with circumcised/uncircumcised phallus. ***Foreskin easily retracted***  Urethral meatus is patent.  No penile discharge. No penile lesions or rashes. Scrotum without lesions, cysts, rashes and/or edema.  Testicles are located scrotally bilaterally. No masses are appreciated in the testicles. Left and right epididymis are normal. Rectal: Patient with  normal sphincter tone. Anus and perineum without scarring or rashes. No rectal masses are appreciated. Prostate is approximately *** grams, *** nodules are appreciated. Seminal vesicles are normal. Skin: No rashes, bruises or suspicious lesions. Lymph: No cervical or inguinal adenopathy. Neurologic: Grossly intact, no focal deficits, moving all 4 extremities. Psychiatric: Normal mood and affect.   Laboratory Data: PSA History  0.8 ng/mL on 10/30/2014  1.1 ng/mL on 01/18/2016  1.1 ng/mL on 01/31/2017  I have reviewed the labs.   Pertinent Imaging: ***   Assessment & Plan:    1. BPH with LUTS  - IPSS score is 7/1, it is improving  - Continue conservative management, avoiding bladder irritants and timed voiding's  - continue Myrbetriq 25 mg daily; refills given  - RTC in one for IPSS and PVR   - BLADDER SCAN AMB NON-IMAGING  2. Nocturia  - Patient found the  Myrbetriq effective  3. Erectile dysfunction  - SHIM score is 16  - I explained to the patient that in order to achieve an erection it takes good functioning of the nervous system (parasympathetic, sympathetic, sensory and motor), good blood flow into the erectile tissue of the penis and a desire to have sex  - I explained that conditions like diabetes, hypertension, coronary artery disease, peripheral vascular disease, smoking, alcohol consumption, age, sleep apnea and BPH can diminish the ability to have an erection  - We discussed trying a  different PDE5 inhibitor, intra-urethral suppositories, intracavernous vasoactive drug injection therapy and/or vacuum constriction device  - He would like to try Stendra 200 mg, # 3 samples given- he has not tried the samples   - Repeat SHIM in one year  No Follow-up on file.  [name], [facility] [address] [place], [place] [address] [phone]

## 2017-02-07 ENCOUNTER — Encounter: Payer: Self-pay | Admitting: Urology

## 2017-02-07 ENCOUNTER — Ambulatory Visit: Payer: Medicare Other | Admitting: Urology

## 2017-03-01 DIAGNOSIS — L920 Granuloma annulare: Secondary | ICD-10-CM | POA: Diagnosis not present

## 2017-03-01 DIAGNOSIS — L578 Other skin changes due to chronic exposure to nonionizing radiation: Secondary | ICD-10-CM | POA: Diagnosis not present

## 2017-03-01 DIAGNOSIS — L218 Other seborrheic dermatitis: Secondary | ICD-10-CM | POA: Diagnosis not present

## 2017-03-01 DIAGNOSIS — L570 Actinic keratosis: Secondary | ICD-10-CM | POA: Diagnosis not present

## 2017-03-01 DIAGNOSIS — L821 Other seborrheic keratosis: Secondary | ICD-10-CM | POA: Diagnosis not present

## 2017-03-21 ENCOUNTER — Encounter: Payer: Self-pay | Admitting: Family Medicine

## 2017-03-21 ENCOUNTER — Ambulatory Visit (INDEPENDENT_AMBULATORY_CARE_PROVIDER_SITE_OTHER): Payer: Medicare Other | Admitting: Family Medicine

## 2017-03-21 VITALS — BP 120/80 | HR 52 | Temp 98.4°F | Ht 68.0 in | Wt 193.5 lb

## 2017-03-21 DIAGNOSIS — E785 Hyperlipidemia, unspecified: Secondary | ICD-10-CM | POA: Insufficient documentation

## 2017-03-21 DIAGNOSIS — Z636 Dependent relative needing care at home: Secondary | ICD-10-CM | POA: Diagnosis not present

## 2017-03-21 DIAGNOSIS — S60352A Superficial foreign body of left thumb, initial encounter: Secondary | ICD-10-CM | POA: Diagnosis not present

## 2017-03-21 DIAGNOSIS — Z7184 Encounter for health counseling related to travel: Secondary | ICD-10-CM

## 2017-03-21 DIAGNOSIS — R41840 Attention and concentration deficit: Secondary | ICD-10-CM | POA: Diagnosis not present

## 2017-03-21 DIAGNOSIS — I471 Supraventricular tachycardia: Secondary | ICD-10-CM

## 2017-03-21 DIAGNOSIS — Z7189 Other specified counseling: Secondary | ICD-10-CM

## 2017-03-21 DIAGNOSIS — N390 Urinary tract infection, site not specified: Secondary | ICD-10-CM | POA: Diagnosis not present

## 2017-03-21 DIAGNOSIS — F331 Major depressive disorder, recurrent, moderate: Secondary | ICD-10-CM | POA: Diagnosis not present

## 2017-03-21 DIAGNOSIS — F338 Other recurrent depressive disorders: Secondary | ICD-10-CM

## 2017-03-21 DIAGNOSIS — Z1159 Encounter for screening for other viral diseases: Secondary | ICD-10-CM | POA: Diagnosis not present

## 2017-03-21 DIAGNOSIS — E782 Mixed hyperlipidemia: Secondary | ICD-10-CM

## 2017-03-21 DIAGNOSIS — F329 Major depressive disorder, single episode, unspecified: Secondary | ICD-10-CM | POA: Diagnosis not present

## 2017-03-21 DIAGNOSIS — R8281 Pyuria: Secondary | ICD-10-CM

## 2017-03-21 DIAGNOSIS — Z113 Encounter for screening for infections with a predominantly sexual mode of transmission: Secondary | ICD-10-CM | POA: Diagnosis not present

## 2017-03-21 LAB — UA/M W/RFLX CULTURE, ROUTINE
BILIRUBIN UA: NEGATIVE
GLUCOSE, UA: NEGATIVE
Ketones, UA: NEGATIVE
LEUKOCYTES UA: NEGATIVE
Nitrite, UA: NEGATIVE
PROTEIN UA: NEGATIVE
Specific Gravity, UA: 1.015 (ref 1.005–1.030)
UUROB: 0.2 mg/dL (ref 0.2–1.0)
pH, UA: 6.5 (ref 5.0–7.5)

## 2017-03-21 LAB — MICROSCOPIC EXAMINATION: Bacteria, UA: NONE SEEN

## 2017-03-21 MED ORDER — VENLAFAXINE HCL ER 75 MG PO CP24: 75.0000 mg | ORAL_CAPSULE | Freq: Every day | ORAL | 3 refills | Status: DC

## 2017-03-21 MED ORDER — BUSPIRONE HCL 15 MG PO TABS: 7.5000 mg | ORAL_TABLET | Freq: Three times a day (TID) | ORAL | 3 refills | Status: DC | PRN

## 2017-03-21 NOTE — Progress Notes (Signed)
BP 120/80 (BP Location: Left Arm, Patient Position: Sitting, Cuff Size: Normal)   Pulse (!) 52   Temp 98.4 F (36.9 C)   Ht 5\' 8"  (1.727 m)   Wt 193 lb 8 oz (87.8 kg)   SpO2 98%   BMI 29.42 kg/m    Subjective:    Patient ID: Matthew Maldonado, male    DOB: [birth year], 67 y.o.   MRN: [id number]  HPI: Matthew Maldonado is a 67 y.o. male who presents today to establish care.   Chief Complaint  Patient presents with  . Foreign Body in Skin    left thumb  . Allergies   Has had a splinter in his L thumb for about a couple of days, can't get it out.   Traveling to [place] in about a month or so. Needs to see travel clinic. Unsure if he's had hepatitis vaccinations.   ANXIETY/STRESS- has issues with depression during the winter months. He notes that he has issues with concentration, has a pretty strong family history of ADD, notes that he has trouble concentrating and keeping on tasks. This has been getting worse recently. Has issues with rage at Matthew Maldonado objects that don't work the way they're supposed to. Took cymbalta and wellbutrin without much benefit. 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 This is very stressful for him. He has been needing to "raise" her instead of being her partner. He is not interested at all in sex. He hasn't had sex with his wife in 5 years. He still finds himself attracted to women, but is not interested in sex. Very concerned about this. Would like to see a psychiatrist. Saw a psychologist in the past, but didn't find it particularly helpful. Duration:exacerbated Anxious mood: yes  Excessive worrying: no Irritability: yes  Sweating: no Nausea: no Palpitations:no Hyperventilation: no Panic attacks: no Agoraphobia: no  Obscessions/compulsions: no Depressed mood: yes Depression screen PHQ 2/9 03/21/2017  Decreased Interest 3  Down, Depressed, Hopeless 3  PHQ - 2 Score 6  Altered sleeping 3  Tired, decreased energy 3    Change in appetite 0  Feeling bad or failure about yourself  2  Trouble concentrating 3  Moving slowly or fidgety/restless 1  Suicidal thoughts 0  PHQ-9 Score 18   Anhedonia: yes Weight changes: no Insomnia: yes hard to stay asleep  Hypersomnia: no Fatigue/loss of energy: yes Feelings of worthlessness: yes Feelings of guilt: yes Impaired concentration/indecisiveness: yes Suicidal ideations: no  Crying spells: no Recent Stressors/Life Changes: yes   Relationship problems: yes   Family stress: yes     Financial stress: no    Job stress: no    Recent death/loss: no  HYPERLIPIDEMIA Hyperlipidemia status: excellent compliance Satisfied with current treatment?  yes Side effects:  no Medication compliance: excellent compliance Past cholesterol meds: atorvastatin Supplements: none Aspirin:  yes The 10-year ASCVD risk score Matthew Maldonado., et al., 2013) is: 12%   Values used to calculate the score:     Age: 37 years     Sex: Male     Is Non-Hispanic African American: No     Diabetic: No     Tobacco smoker: Yes     Systolic Blood Pressure: 720 mmHg     Is BP treated: No     HDL Cholesterol: 56 mg/dL     Total Cholesterol: 133 mg/dL Chest pain:  no Coronary artery disease:  no   Active Ambulatory  Problems    Diagnosis Date Noted  . Insomnia, persistent 09/03/2014  . Acid reflux 09/22/2014  . Bursitis, trochanteric 01/28/2014  . Enthesopathy of hip 08/05/2014  . H/O malignant neoplasm of skin 08/20/2012  . 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 BPH (benign prostatic hypertrophy) with urinary obstruction 09/22/2014  . BPH with obstruction/lower urinary tract symptoms 11/08/2014  . Nocturia 11/08/2014  . Paroxysmal supraventricular tachycardia ([place]) 03/21/2017  . Hyperlipidemia 03/21/2017  . Seasonal affective disorder ([place]) 03/21/2017  . Depression 03/22/2017  . Caregiver stress 03/22/2017   Resolved Ambulatory Problems    Diagnosis Date Noted  . No Resolved  Ambulatory Problems   Past Medical History:  Diagnosis Date  . Anxiety   . Blood vessel disorder   . BPH (benign prostatic hyperplasia)   . Depression   . Insomnia   . Minimal change disease   . Nocturia    History reviewed. No pertinent surgical history. Outpatient Encounter Medications as of 03/21/2017  Medication Sig Note  . aspirin EC 81 MG tablet Take 81 mg by mouth daily.   Marland Kitchen atorvastatin (LIPITOR) 80 MG tablet Take 80 mg by mouth. 01/18/2016: Received from: [place]: Take 1 tablet (80 mg total) by mouth daily.  . busPIRone (BUSPAR) 15 MG tablet Take 0.5-1 tablets (7.5-15 mg total) by mouth 3 (three) times daily as needed.   Marland Kitchen co-enzyme Q-10 30 MG capsule Take 30 mg by mouth daily.    Marland Kitchen esomeprazole (NEXIUM) 20 MG capsule Take by mouth. 01/01/2015: Received from: [place]  . fluticasone (FLONASE) 50 MCG/ACT nasal spray Place 2 sprays into both nostrils daily. 02/08/2016: PRN  . ketoconazole (NIZORAL) 2 % shampoo  02/08/2016: PRN  . loratadine (CLARITIN) 10 MG tablet Take by mouth. 02/08/2016: PRN  . metoprolol succinate (TOPROL-XL) 25 MG 24 hr tablet Take by mouth. 02/08/2016: Received from: [place]: Take 1 tablet (25 mg total) by mouth once daily.  . Multiple Vitamin (MULTI-VITAMINS) TABS Take by mouth. 01/18/2016: Received from: [facility]: Take 1 tablet by mouth daily. Frequency:QD   Dosage:0.0     Instructions:  Note:Dose: 1 TAB  . Multiple Vitamins-Minerals (MULTIVITAMIN WITH MINERALS) tablet Take 1 tablet by mouth daily.   . Omega-3 Fatty Acids (FISH OIL) 435 MG CAPS Take 1 capsule by mouth daily.   . pseudoephedrine-guaifenesin (MUCINEX D) 60-600 MG 12 hr tablet Take by mouth. 02/08/2016: PRN  . venlafaxine XR (EFFEXOR XR) 75 MG 24 hr capsule Take 1 capsule (75 mg total) by mouth daily with breakfast.   . [DISCONTINUED] busPIRone (BUSPAR) 15 MG tablet Take by mouth. 01/18/2016: Received  from: [place]: Take 1 tablet (15 mg total) by mouth 2 (two) times daily.  . [DISCONTINUED] cycloSPORINE (SANDIMMUNE) 100 MG capsule Take by mouth. 01/18/2016: Received from: [place]: Take 100 mg by mouth 2 (two) times daily.  . [DISCONTINUED] cycloSPORINE modified (NEORAL) 100 MG capsule Take 100 mg by mouth. 09/22/2014: Received from: [facility]  . [DISCONTINUED] escitalopram (LEXAPRO) 20 MG tablet take 1 tablet by mouth once daily MUST HAVE APPT FOR FURTHER REFILLS.   . [DISCONTINUED] mirabegron ER (MYRBETRIQ) 25 MG TB24 tablet Take 1 tablet (25 mg total) by mouth daily. (Patient not taking: Reported on 03/21/2017)   . [DISCONTINUED] tadalafil (CIALIS) 5 MG tablet Take 1 tablet (5 mg total) by mouth daily as needed for erectile dysfunction. (Patient not taking: Reported  on 02/08/2016)   . [DISCONTINUED] traZODone (DESYREL) 50 MG tablet Take by mouth. 09/22/2014: Received from: [place]   No facility-administered encounter medications on file as of 03/21/2017.    No Known Allergies Social History   Socioeconomic History  . Marital status: Married    Spouse name: None  . Number of children: None  . Years of education: None  . Highest education level: None  Social Needs  . Financial resource strain: None  . Food insecurity - worry: None  . Food insecurity - inability: None  . Transportation needs - medical: None  . Transportation needs - non-medical: None  Occupational History  . None  Tobacco Use  . Smoking status: Current Some Day Smoker    Types: Cigars  . Smokeless tobacco: Never Used  Substance and Sexual Activity  . Alcohol use: Yes    Alcohol/week: 1.8 oz    Types: 3 Standard drinks or equivalent per week  . Drug use: No  . Sexual activity: Not Currently  Other Topics Concern  . None  Social History Narrative  . None   Family History  Problem Relation Age of Onset  . Osteoporosis  Mother   . Heart disease Father   . Heart disease Brother   . Kidney disease Neg Hx   . Prostate cancer Neg Hx     Review of Systems  Constitutional: Negative.   HENT: Negative.   Respiratory: Negative.   Cardiovascular: Negative.   Genitourinary:       Decreased libido, disinterest in sex  Skin: Positive for wound (Got a splinter stuck in his finger while working in the yard over the weekend).  Neurological: Negative.   Psychiatric/Behavioral: Positive for agitation, decreased concentration, dysphoric mood and sleep disturbance. Negative for behavioral problems, confusion, hallucinations, self-injury and suicidal ideas. The patient is nervous/anxious. The patient is not hyperactive.     Per HPI unless specifically indicated above     Objective:    BP 120/80 (BP Location: Left Arm, Patient Position: Sitting, Cuff Size: Normal)   Pulse (!) 52   Temp 98.4 F (36.9 C)   Ht 5\' 8"  (1.727 m)   Wt 193 lb 8 oz (87.8 kg)   SpO2 98%   BMI 29.42 kg/m   Wt Readings from Last 3 Encounters:  03/21/17 193 lb 8 oz (87.8 kg)  02/08/16 182 lb 11.2 oz (82.9 kg)  01/18/16 178 lb 3.2 oz (80.8 kg)    Physical Exam  Constitutional: He is oriented to person, place, and time. He appears well-developed and well-nourished. No distress.  HENT:  Head: Normocephalic and atraumatic.  Right Ear: Hearing normal.  Left Ear: Hearing normal.  Nose: Nose normal.  Eyes: Conjunctivae and lids are normal. Right eye exhibits no discharge. Left eye exhibits no discharge. No scleral icterus.  Cardiovascular: Normal rate, regular rhythm, normal heart sounds and intact distal pulses. Exam reveals no gallop and no friction rub.  No murmur heard. Pulmonary/Chest: Effort normal and breath sounds normal. No respiratory distress. He has no wheezes. He has no rales. He exhibits no tenderness.  Musculoskeletal: Normal range of motion.  Neurological: He is alert and oriented to person, place, and time.  Skin: Skin is  warm, dry and intact. No rash noted. He is not diaphoretic. No erythema. No pallor.  Small splinter in L thumb, no sign of infection  Psychiatric: He has a normal mood and affect. His speech is normal and behavior is normal. Judgment and thought content  normal. Cognition and memory are normal.  Nursing note and vitals reviewed.   Results for orders placed or performed in visit on 03/21/17  Microscopic Examination  Result Value Ref Range   WBC, UA 0-5 0 - 5 /hpf   RBC, UA 0-2 0 - 2 /hpf   Epithelial Cells (non renal) CANCELED    Bacteria, UA None seen None seen/Few  Comprehensive metabolic panel  Result Value Ref Range   Glucose 72 65 - 99 mg/dL   BUN 20 8 - 27 mg/dL   Creatinine, Ser 0.84 0.76 - 1.27 mg/dL   GFR calc non Af Amer 91 >59 mL/min/1.73   GFR calc Af Amer 105 >59 mL/min/1.73   BUN/Creatinine Ratio 24 10 - 24   Sodium 141 134 - 144 mmol/L   Potassium 4.2 3.5 - 5.2 mmol/L   Chloride 103 96 - 106 mmol/L   CO2 20 20 - 29 mmol/L   Calcium 9.4 8.6 - 10.2 mg/dL   Total Protein 6.9 6.0 - 8.5 g/dL   Albumin 4.6 3.6 - 4.8 g/dL   Globulin, Total 2.3 1.5 - 4.5 g/dL   Albumin/Globulin Ratio 2.0 1.2 - 2.2   Bilirubin Total 0.4 0.0 - 1.2 mg/dL   Alkaline Phosphatase 61 39 - 117 IU/L   AST 36 0 - 40 IU/L   ALT 29 0 - 44 IU/L  CBC with Differential/Platelet  Result Value Ref Range   WBC 5.6 3.4 - 10.8 x10E3/uL   RBC 4.71 4.14 - 5.80 x10E6/uL   Hemoglobin 14.8 13.0 - 17.7 g/dL   Hematocrit 43.9 37.5 - 51.0 %   MCV 93 79 - 97 fL   MCH 31.4 26.6 - 33.0 pg   MCHC 33.7 31.5 - 35.7 g/dL   RDW 13.6 12.3 - 15.4 %   Platelets 207 150 - 379 x10E3/uL   Neutrophils 38 Not Estab. %   Lymphs 46 Not Estab. %   Monocytes 10 Not Estab. %   Eos 6 Not Estab. %   Basos 0 Not Estab. %   Neutrophils Absolute 2.1 1.4 - 7.0 x10E3/uL   Lymphocytes Absolute 2.5 0.7 - 3.1 x10E3/uL   Monocytes Absolute 0.6 0.1 - 0.9 x10E3/uL   EOS (ABSOLUTE) 0.3 0.0 - 0.4 x10E3/uL   Basophils Absolute 0.0 0.0 -  0.2 x10E3/uL   Immature Granulocytes 0 Not Estab. %   Immature Grans (Abs) 0.0 0.0 - 0.1 x10E3/uL  Lipid Panel w/o Chol/HDL Ratio  Result Value Ref Range   Cholesterol, Total 133 100 - 199 mg/dL   Triglycerides 60 0 - 149 mg/dL   HDL 56 >39 mg/dL   VLDL Cholesterol Cal 12 5 - 40 mg/dL   LDL Calculated 65 0 - 99 mg/dL  TSH  Result Value Ref Range   TSH 1.770 0.450 - 4.500 uIU/mL  UA/M w/rflx Culture, Routine  Result Value Ref Range   Specific Gravity, UA 1.015 1.005 - 1.030   pH, UA 6.5 5.0 - 7.5   Color, UA Yellow Yellow   Appearance Ur Clear Clear   Leukocytes, UA Negative Negative   Protein, UA Negative Negative/Trace   Glucose, UA Negative Negative   Ketones, UA Negative Negative   RBC, UA Trace (A) Negative   Bilirubin, UA Negative Negative   Urobilinogen, Ur 0.2 0.2 - 1.0 mg/dL   Nitrite, UA Negative Negative   Microscopic Examination See below:   Hepatitis B surface antibody  Result Value Ref Range   Hepatitis B Surf Ab Quant <3.1 (L)  Immunity>9.9 mIU/mL  Hepatitis, Acute  Result Value Ref Range   Hep A IgM Negative Negative   Hepatitis B Surface Ag Negative Negative   Hep B C IgM Negative Negative   Hep C Virus Ab <0.1 0.0 - 0.9 s/co ratio  HM COLONOSCOPY  Result Value Ref Range   HM Colonoscopy Patient Reported See Report (in chart), Patient Reported      Assessment & Plan:   Problem List Items Addressed This Visit      Cardiovascular and Mediastinum   Paroxysmal supraventricular tachycardia (HCC)    Stable on metoprolol. Continue current regimen. Continue to monitor.       Relevant Medications   aspirin EC 81 MG tablet   Other Relevant Orders   Comprehensive metabolic panel (Completed)   CBC with Differential/Platelet (Completed)   TSH (Completed)   UA/M w/rflx Culture, Routine (Completed)     Other   Hyperlipidemia    Stable on current regimen. Continue current regimen. Continue to monitor. Refills given today.      Relevant Medications    aspirin EC 81 MG tablet   Other Relevant Orders   Comprehensive metabolic panel (Completed)   CBC with Differential/Platelet (Completed)   Lipid Panel w/o Chol/HDL Ratio (Completed)   TSH (Completed)   UA/M w/rflx Culture, Routine (Completed)   Seasonal affective disorder ([place])    Not under great control. Will change to effexor from lexapro. Would like to see counselor and psychiatrist. Referrals generated today. Recheck 1 month.       Relevant Medications   venlafaxine XR (EFFEXOR XR) 75 MG 24 hr capsule   busPIRone (BUSPAR) 15 MG tablet   Other Relevant Orders   Comprehensive metabolic panel (Completed)   CBC with Differential/Platelet (Completed)   TSH (Completed)   UA/M w/rflx Culture, Routine (Completed)   Ambulatory referral to Psychiatry   Depression - Primary    Not under great control. Will change to effexor from lexapro. Would like to see counselor and psychiatrist. Referrals generated today. Recheck 1 month.       Relevant Medications   venlafaxine XR (EFFEXOR XR) 75 MG 24 hr capsule   busPIRone (BUSPAR) 15 MG tablet   Other Relevant Orders   Comprehensive metabolic panel (Completed)   CBC with Differential/Platelet (Completed)   TSH (Completed)   UA/M w/rflx Culture, Routine (Completed)   Ambulatory referral to Psychiatry   Caregiver stress    Not under great control. Will change to effexor from lexapro. Would like to see counselor and psychiatrist. Referrals generated today. Recheck 1 month.       Relevant Orders   Ambulatory referral to Psychiatry    Other Visit Diagnoses    Concentration deficit       Discussed that I do not diagnose adult ADD- will get in for neuropsych testing. Referral generated today.   Relevant Orders   Comprehensive metabolic panel (Completed)   CBC with Differential/Platelet (Completed)   TSH (Completed)   UA/M w/rflx Culture, Routine (Completed)   Ambulatory referral to Neuropsychology   Foreign body of left thumb, initial  encounter       Splinter removed with toothed forecepts. Triple antibiotic ointment put on it with a bandaid   Relevant Orders   Comprehensive metabolic panel (Completed)   CBC with Differential/Platelet (Completed)   TSH (Completed)   UA/M w/rflx Culture, Routine (Completed)   Travel advice encounter       WIll check labs to see if he needs to have hepatitis vaccines- referral  to travel clinic made today.   Relevant Orders   Comprehensive metabolic panel (Completed)   CBC with Differential/Platelet (Completed)   TSH (Completed)   UA/M w/rflx Culture, Routine (Completed)   Hepatitis B surface antibody (Completed)   Hepatitis A antibody, IgM   Ambulatory referral to Travel Clinic   Encounter for hepatitis C screening test for low risk patient       Labs ordered today. Await results.    Relevant Orders   Hepatitis B surface antibody (Completed)   Hepatitis, Acute (Completed)   Hepatitis A antibody, IgM   Routine screening for STI (sexually transmitted infection)       Labs ordered today. Await results.    Relevant Orders   Hepatitis, Acute (Completed)       Follow up plan: Return in about 4 weeks (around 04/18/2017) for Follow up mood.

## 2017-03-22 ENCOUNTER — Encounter: Payer: Self-pay | Admitting: Family Medicine

## 2017-03-22 ENCOUNTER — Telehealth: Payer: Self-pay | Admitting: Family Medicine

## 2017-03-22 DIAGNOSIS — Z636 Dependent relative needing care at home: Secondary | ICD-10-CM | POA: Insufficient documentation

## 2017-03-22 DIAGNOSIS — Z23 Encounter for immunization: Secondary | ICD-10-CM

## 2017-03-22 LAB — LIPID PANEL W/O CHOL/HDL RATIO
Cholesterol, Total: 133 mg/dL (ref 100–199)
HDL: 56 mg/dL (ref >39)
LDL Calculated: 65 mg/dL (ref 0–99)
TRIGLYCERIDES: 60 mg/dL (ref 0–149)
VLDL Cholesterol Cal: 12 mg/dL (ref 5–40)

## 2017-03-22 LAB — COMPREHENSIVE METABOLIC PANEL
ALBUMIN: 4.6 g/dL (ref 3.6–4.8)
ALT: 29 IU/L (ref 0–44)
AST: 36 IU/L (ref 0–40)
Albumin/Globulin Ratio: 2 (ref 1.2–2.2)
Alkaline Phosphatase: 61 IU/L (ref 39–117)
BUN / CREAT RATIO: 24 (ref 10–24)
BUN: 20 mg/dL (ref 8–27)
Bilirubin Total: 0.4 mg/dL (ref 0.0–1.2)
CHLORIDE: 103 mmol/L (ref 96–106)
CO2: 20 mmol/L (ref 20–29)
Calcium: 9.4 mg/dL (ref 8.6–10.2)
Creatinine, Ser: 0.84 mg/dL (ref 0.76–1.27)
GFR calc non Af Amer: 91 mL/min/{1.73_m2} (ref >59)
GFR, EST AFRICAN AMERICAN: 105 mL/min/{1.73_m2} (ref >59)
GLOBULIN, TOTAL: 2.3 g/dL (ref 1.5–4.5)
Glucose: 72 mg/dL (ref 65–99)
Potassium: 4.2 mmol/L (ref 3.5–5.2)
SODIUM: 141 mmol/L (ref 134–144)
TOTAL PROTEIN: 6.9 g/dL (ref 6.0–8.5)

## 2017-03-22 LAB — HEPATITIS PANEL, ACUTE
Hep A IgM: NEGATIVE
Hep B C IgM: NEGATIVE
Hep C Virus Ab: <0.1 s/co ratio (ref 0.0–0.9)
Hepatitis B Surface Ag: NEGATIVE

## 2017-03-22 LAB — CBC WITH DIFFERENTIAL/PLATELET
BASOS ABS: 0 10*3/uL (ref 0.0–0.2)
Basos: 0 %
EOS (ABSOLUTE): 0.3 10*3/uL (ref 0.0–0.4)
EOS: 6 %
HEMATOCRIT: 43.9 % (ref 37.5–51.0)
Hemoglobin: 14.8 g/dL (ref 13.0–17.7)
Immature Grans (Abs): 0 10*3/uL (ref 0.0–0.1)
Immature Granulocytes: 0 %
LYMPHS ABS: 2.5 10*3/uL (ref 0.7–3.1)
Lymphs: 46 %
MCH: 31.4 pg (ref 26.6–33.0)
MCHC: 33.7 g/dL (ref 31.5–35.7)
MCV: 93 fL (ref 79–97)
MONOCYTES: 10 %
MONOS ABS: 0.6 10*3/uL (ref 0.1–0.9)
NEUTROS ABS: 2.1 10*3/uL (ref 1.4–7.0)
Neutrophils: 38 %
Platelets: 207 10*3/uL (ref 150–379)
RBC: 4.71 x10E6/uL (ref 4.14–5.80)
RDW: 13.6 % (ref 12.3–15.4)
WBC: 5.6 10*3/uL (ref 3.4–10.8)

## 2017-03-22 LAB — HEPATITIS B SURFACE ANTIBODY, QUANTITATIVE

## 2017-03-22 LAB — TSH: TSH: 1.77 u[IU]/mL (ref 0.450–4.500)

## 2017-03-22 NOTE — Assessment & Plan Note (Signed)
Not under great control. Will change to effexor from lexapro. Would like to see counselor and psychiatrist. Referrals generated today. Recheck 1 month.

## 2017-03-22 NOTE — Assessment & Plan Note (Signed)
Stable on metoprolol. Continue current regimen. Continue to monitor.

## 2017-03-22 NOTE — Telephone Encounter (Signed)
Please let him know that his labs came back nice and normal. He does need to be vaccinated for both hep A and Hep B as he is not immune to either one.  Insurance will pay for his hep B, but may not pay for hep A- so it's up to him if he wants to get it with [place] or at the travel clinic. Thanks!

## 2017-03-22 NOTE — Telephone Encounter (Signed)
Patient notified

## 2017-03-22 NOTE — Assessment & Plan Note (Signed)
Stable on current regimen. Continue current regimen. Continue to monitor. Refills given today. 

## 2017-03-31 ENCOUNTER — Ambulatory Visit (INDEPENDENT_AMBULATORY_CARE_PROVIDER_SITE_OTHER): Payer: Medicare Other | Admitting: Internal Medicine

## 2017-03-31 DIAGNOSIS — Z9189 Other specified personal risk factors, not elsewhere classified: Secondary | ICD-10-CM | POA: Diagnosis not present

## 2017-03-31 DIAGNOSIS — Z23 Encounter for immunization: Secondary | ICD-10-CM | POA: Diagnosis not present

## 2017-03-31 DIAGNOSIS — Z789 Other specified health status: Secondary | ICD-10-CM

## 2017-03-31 DIAGNOSIS — Z7184 Encounter for health counseling related to travel: Secondary | ICD-10-CM

## 2017-03-31 DIAGNOSIS — Z7189 Other specified counseling: Secondary | ICD-10-CM

## 2017-03-31 DIAGNOSIS — Z7185 Encounter for immunization safety counseling: Secondary | ICD-10-CM

## 2017-03-31 MED ORDER — AZITHROMYCIN 500 MG PO TABS: 500.0000 mg | ORAL_TABLET | Freq: Every day | ORAL | 0 refills | Status: DC

## 2017-03-31 MED ORDER — OSELTAMIVIR PHOSPHATE 75 MG PO CAPS: 75.0000 mg | ORAL_CAPSULE | Freq: Two times a day (BID) | ORAL | 0 refills | Status: DC

## 2017-03-31 NOTE — Patient Instructions (Signed)
[facility]                [address], [place]                   [place], [place] [address]                      Phone: [phone]                        Fax: [phone]   Planned departure date: Feb 25      Planned return date:  Countries of travel: [place], [place], [place], [place], [place]  Guidelines for the Prevention & Treatment of Traveler's Diarrhea  Prevention: "Boil it, Peel it, Lacinda Axon it, or Forget it"   the fewer chances -> lower risk: try to stick to food & water precautions as much as possible"   If it's "piping hot"; it is probably okay, if not, it may not be   Treatment   1) You should always take care to drink lots of fluids in order to avoid dehydration   2) You should bring medications with you in case you come down with a case of diarrhea   3) OTC = bring pepto-bismol - can take with initial abdominal symptoms;                    Imodium - can help slow down your intestinal tract, can help relief cramps                    and diarrhea, can take if no bloody diarrhea  Use azithromycin if needed for traveler's diarrhea  Guidelines for the Prevention of Malaria  Avoidance:  -fewer mosquito bites = lower risk. Mosquitos can bite at night as well as daytime  -cover up (long sleeve clothing), mosquito nets, screens  -Insect repellent for your skin ( DEET containing lotion > 20%): for clothes ( permethrin spray)   Immunizations received today: hep A and typhoid injectable Future immunizations, if indicated 2nd hepatitis A in 6 months  Prior to travel:  1) Be sure to pick up appropriate prescriptions, including medicine you take daily. Do not expect to be able to fill your prescriptions abroad.  2) Strongly consider obtaining traveler's insurance, including emergency evacuation insurance. Most plans in the [place] do not cover participants abroad. (see below for resources)  3) Register at the appropriate [place]  embassy or consulate with travel dates so they are aware of your presence in-country and for helpful advice during travel using the [employer] (STEP, GreenNylon.com.cy).  4) Leave contact information with a relative or friend.  5) Keep a Research officer, political party, credit cards in case they become lost or stolen  6) Inform your credit card company that you will be travelling abroad   During travel:  1) If you become ill and need medical advice, the [place] [employer]	[place] of the country you are traveling in general provides a list of [place] speaking doctors.  We are also available on MyChart for remote consultation if you register prior to travel. 2) Avoid motorcycles or scooters when at all possible. Traffic laws in many countries are lax and accidents occur frequently.  3) Do not take any unnecessary risks that you wouldn't do at home.   Resources:  -Country specific information: BlindResource.ca or GreenNylon.com.cy  -Press photographer (DEET, mosquito  nets): [employer], [employer] store, [employer], [place] insurance options: [place].com; http://clayton-rivera.info/; travelguard.com or  Post Travel:  If you return from your trip ill, call your primary care doctor or our travel clinic @ [phone].   Enjoy your trip and know that with proper pre-travel preparation, most people have an enjoyable and uninterrupted trip!

## 2017-03-31 NOTE — Progress Notes (Signed)
Subjective:   Matthew Maldonado is a 67 y.o. male who presents to the Infectious Disease clinic for travel consultation. Planned departure date: feb 25    Planned return date:  Countries of travel: [place], [place], [place], [place], [place] Areas in country: urban and rural Staying - hotel Purpose of travel: vacation Prior travel out of [place]: yes - [place], [place], [place], [place], [place]  Previous hx of flu vaccine last year     Objective:   Medications: lexapro, asprin, metoprolol, statin    Assessment:   No contraindications to travel. none  Plan:   Pre travel vaccine = will give hep a, and typhoid injection. He is uptodate on tdap  Traveler's diarrhea = gave precautions and rx for azithromycin  Other meds= gave rx to use tamiflu if flu like illness  Mosquito bite = gave precautionary measures to do but surprisingly will not need anti-malarials

## 2017-04-06 ENCOUNTER — Telehealth: Payer: Self-pay

## 2017-04-06 ENCOUNTER — Telehealth: Payer: Self-pay | Admitting: *Deleted

## 2017-04-06 ENCOUNTER — Ambulatory Visit (INDEPENDENT_AMBULATORY_CARE_PROVIDER_SITE_OTHER): Payer: Medicare Other

## 2017-04-06 DIAGNOSIS — Z23 Encounter for immunization: Secondary | ICD-10-CM | POA: Diagnosis present

## 2017-04-06 NOTE — Telephone Encounter (Signed)
Patient came in today to get his first Hep B vaccine before his international trip later this year. Patient mentioned that his wife is concerned that they did not get Malaria prescription sent in to their pharmacy and was wondering if this could be done before their trip 05/08/17. He would like a prescription for his wife and himself.  [place]

## 2017-04-06 NOTE — Telephone Encounter (Signed)
Patient came in for travel on Friday 03/31/17 and wanted Hepatitis B immunization. We did not have Hep B at the time of his visit and he came today for the injection. Due to the fact that is was not mentioned in the note from Dr Baxter Flattery at his visit I had to page her to ask if it is ok for him to have the injection. He was not happy he had to come back to the clinic because we were out of the medication. He was given the injection and not charged the administration fee.   Per Dr Baxter Flattery patient given Hepatitis B immunization.

## 2017-04-07 ENCOUNTER — Other Ambulatory Visit: Payer: Self-pay | Admitting: Internal Medicine

## 2017-04-07 MED ORDER — ATOVAQUONE-PROGUANIL HCL 250-100 MG PO TABS: 1.0000 | ORAL_TABLET | Freq: Every day | ORAL | 0 refills | Status: DC

## 2017-04-07 NOTE — Telephone Encounter (Signed)
I spoke to patient and will give them 16 d of malarone. I have sent in rx for him and his wife

## 2017-04-07 NOTE — Progress Notes (Signed)
Patient wanted malaria proph for the portion of the trip on the riverboat in [place].

## 2017-04-13 DIAGNOSIS — Z6828 Body mass index (BMI) 28.0-28.9, adult: Secondary | ICD-10-CM | POA: Diagnosis not present

## 2017-04-13 DIAGNOSIS — K219 Gastro-esophageal reflux disease without esophagitis: Secondary | ICD-10-CM | POA: Diagnosis not present

## 2017-04-13 DIAGNOSIS — N049 Nephrotic syndrome with unspecified morphologic changes: Secondary | ICD-10-CM | POA: Diagnosis not present

## 2017-04-13 DIAGNOSIS — Z7982 Long term (current) use of aspirin: Secondary | ICD-10-CM | POA: Diagnosis not present

## 2017-04-13 DIAGNOSIS — Z8619 Personal history of other infectious and parasitic diseases: Secondary | ICD-10-CM | POA: Diagnosis not present

## 2017-04-13 DIAGNOSIS — F17290 Nicotine dependence, other tobacco product, uncomplicated: Secondary | ICD-10-CM | POA: Diagnosis not present

## 2017-04-13 DIAGNOSIS — Z87448 Personal history of other diseases of urinary system: Secondary | ICD-10-CM | POA: Diagnosis not present

## 2017-04-13 DIAGNOSIS — Z87441 Personal history of nephrotic syndrome: Secondary | ICD-10-CM | POA: Diagnosis not present

## 2017-04-13 DIAGNOSIS — N040 Nephrotic syndrome with minor glomerular abnormality: Secondary | ICD-10-CM | POA: Diagnosis not present

## 2017-04-18 DIAGNOSIS — M2042 Other hammer toe(s) (acquired), left foot: Secondary | ICD-10-CM | POA: Diagnosis not present

## 2017-04-24 ENCOUNTER — Ambulatory Visit (INDEPENDENT_AMBULATORY_CARE_PROVIDER_SITE_OTHER): Payer: Medicare Other | Admitting: Family Medicine

## 2017-04-24 ENCOUNTER — Encounter: Payer: Self-pay | Admitting: Family Medicine

## 2017-04-24 ENCOUNTER — Other Ambulatory Visit: Payer: Self-pay | Admitting: Family Medicine

## 2017-04-24 ENCOUNTER — Ambulatory Visit (INDEPENDENT_AMBULATORY_CARE_PROVIDER_SITE_OTHER): Payer: Medicare Other | Admitting: Psychiatry

## 2017-04-24 ENCOUNTER — Other Ambulatory Visit: Payer: Self-pay

## 2017-04-24 ENCOUNTER — Encounter: Payer: Self-pay | Admitting: Psychiatry

## 2017-04-24 VITALS — BP 115/75 | HR 60 | Temp 98.0°F | Wt 191.8 lb

## 2017-04-24 VITALS — BP 106/75 | HR 67 | Temp 97.9°F | Wt 193.2 lb

## 2017-04-24 DIAGNOSIS — F338 Other recurrent depressive disorders: Secondary | ICD-10-CM | POA: Diagnosis not present

## 2017-04-24 DIAGNOSIS — J209 Acute bronchitis, unspecified: Secondary | ICD-10-CM

## 2017-04-24 DIAGNOSIS — F411 Generalized anxiety disorder: Secondary | ICD-10-CM

## 2017-04-24 MED ORDER — ESOMEPRAZOLE MAGNESIUM 20 MG PO CPDR: 20.0000 mg | DELAYED_RELEASE_CAPSULE | Freq: Every day | ORAL | 1 refills | Status: DC

## 2017-04-24 MED ORDER — TRAZODONE HCL 50 MG PO TABS: 50.0000 mg | ORAL_TABLET | Freq: Every evening | ORAL | 0 refills | Status: DC | PRN

## 2017-04-24 MED ORDER — AZITHROMYCIN 250 MG PO TABS: ORAL_TABLET | ORAL | 0 refills | Status: DC

## 2017-04-24 MED ORDER — BENZONATATE 200 MG PO CAPS: 200.0000 mg | ORAL_CAPSULE | Freq: Two times a day (BID) | ORAL | 0 refills | Status: DC | PRN

## 2017-04-24 MED ORDER — ATORVASTATIN CALCIUM 80 MG PO TABS: 80.0000 mg | ORAL_TABLET | Freq: Every day | ORAL | 1 refills | Status: DC

## 2017-04-24 MED ORDER — METOPROLOL SUCCINATE ER 25 MG PO TB24: 25.0000 mg | ORAL_TABLET | Freq: Every day | ORAL | 1 refills | Status: DC

## 2017-04-24 MED ORDER — FLUTICASONE PROPIONATE 50 MCG/ACT NA SUSP: 2.0000 | Freq: Every day | NASAL | 3 refills | Status: AC

## 2017-04-24 MED ORDER — HYDROCOD POLST-CPM POLST ER 10-8 MG/5ML PO SUER: 5.0000 mL | Freq: Every evening | ORAL | 0 refills | Status: DC | PRN

## 2017-04-24 MED ORDER — BUSPIRONE HCL 15 MG PO TABS: 15.0000 mg | ORAL_TABLET | Freq: Two times a day (BID) | ORAL | 0 refills | Status: DC

## 2017-04-24 MED ORDER — VENLAFAXINE HCL ER 75 MG PO CP24: 75.0000 mg | ORAL_CAPSULE | Freq: Every day | ORAL | 0 refills | Status: DC

## 2017-04-24 MED ORDER — PREDNISONE 10 MG PO TABS: ORAL_TABLET | ORAL | 0 refills | Status: DC

## 2017-04-24 NOTE — Progress Notes (Signed)
BP 115/75 (BP Location: Left Arm, Patient Position: Sitting, Cuff Size: Normal)   Pulse 60   Temp 98 F (36.7 C) (Oral)   Wt 191 lb 12.8 oz (87 kg)   SpO2 97%   BMI 29.16 kg/m    Subjective:    Patient ID: Matthew Maldonado, male    DOB: [birth year], 67 y.o.   MRN: [id number]  HPI: Matthew Maldonado is a 67 y.o. male  Chief Complaint  Patient presents with  . Nasal Congestion   UPPER RESPIRATORY TRACT INFECTION Duration: about a week Worst symptom: congestion, sore throat Fever: no Cough: yes Shortness of breath: no Wheezing: yes Chest pain: no Chest tightness: no Chest congestion: yes Nasal congestion: yes Runny nose: yes Post nasal drip: yes Sneezing: no Sore throat: yes Swollen glands: no Sinus pressure: yes Headache: yes- only this AM Face pain: no Toothache: no Ear pain: no  Ear pressure: yes bilateral Eyes red/itching:no Eye drainage/crusting: yes  Vomiting: no Rash: no Fatigue: yes Sick contacts: yes Strep contacts: no  Context: worse Recurrent sinusitis: no Relief with OTC cold/cough medications: no  Treatments attempted: mucinex  Relevant past medical, surgical, family and social history reviewed and updated as indicated. Interim medical history since our last visit reviewed. Allergies and medications reviewed and updated.  Review of Systems  Constitutional: Positive for fatigue and fever. Negative for activity change, appetite change, chills, diaphoresis and unexpected weight change.  HENT: Positive for congestion, postnasal drip, rhinorrhea and sinus pressure. Negative for dental problem, drooling, ear discharge, ear pain, facial swelling, hearing loss, mouth sores, nosebleeds, sinus pain, sneezing, sore throat, tinnitus, trouble swallowing and voice change.   Respiratory: Positive for cough, chest tightness, shortness of breath and wheezing. Negative for apnea, choking and stridor.   Cardiovascular: Negative.   Psychiatric/Behavioral: Negative.      Per HPI unless specifically indicated above     Objective:    BP 115/75 (BP Location: Left Arm, Patient Position: Sitting, Cuff Size: Normal)   Pulse 60   Temp 98 F (36.7 C) (Oral)   Wt 191 lb 12.8 oz (87 kg)   SpO2 97%   BMI 29.16 kg/m   Wt Readings from Last 3 Encounters:  04/24/17 191 lb 12.8 oz (87 kg)  03/21/17 193 lb 8 oz (87.8 kg)  02/08/16 182 lb 11.2 oz (82.9 kg)    Physical Exam  Constitutional: He is oriented to person, place, and time. He appears well-developed and well-nourished. No distress.  HENT:  Head: Normocephalic and atraumatic.  Right Ear: Hearing and external ear normal.  Left Ear: Hearing and external ear normal.  Nose: Nose normal.  Mouth/Throat: Oropharynx is clear and moist.  Eyes: Conjunctivae, EOM and lids are normal. Pupils are equal, round, and reactive to light. Right eye exhibits no discharge. Left eye exhibits no discharge. No scleral icterus.  Neck: Normal range of motion. Neck supple. No JVD present. No tracheal deviation present. No thyromegaly present.  Cardiovascular: Normal rate, regular rhythm, normal heart sounds and intact distal pulses. Exam reveals no gallop and no friction rub.  No murmur heard. Pulmonary/Chest: Effort normal. No stridor. No respiratory distress. He has wheezes. He has no rales. He exhibits no tenderness.  Musculoskeletal: Normal range of motion.  Lymphadenopathy:    He has no cervical adenopathy.  Neurological: He is alert and oriented to person, place, and time.  Skin: Skin is warm, dry and intact. No rash noted. He is not diaphoretic. No erythema. No pallor.  Psychiatric:  He has a normal mood and affect. His speech is normal and behavior is normal. Judgment and thought content normal. Cognition and memory are normal.  Nursing note and vitals reviewed.   Results for orders placed or performed in visit on 03/21/17  Microscopic Examination  Result Value Ref Range   WBC, UA 0-5 0 - 5 /hpf   RBC, UA 0-2 0 -  2 /hpf   Epithelial Cells (non renal) CANCELED    Bacteria, UA None seen None seen/Few  Comprehensive metabolic panel  Result Value Ref Range   Glucose 72 65 - 99 mg/dL   BUN 20 8 - 27 mg/dL   Creatinine, Ser 0.84 0.76 - 1.27 mg/dL   GFR calc non Af Amer 91 >59 mL/min/1.73   GFR calc Af Amer 105 >59 mL/min/1.73   BUN/Creatinine Ratio 24 10 - 24   Sodium 141 134 - 144 mmol/L   Potassium 4.2 3.5 - 5.2 mmol/L   Chloride 103 96 - 106 mmol/L   CO2 20 20 - 29 mmol/L   Calcium 9.4 8.6 - 10.2 mg/dL   Total Protein 6.9 6.0 - 8.5 g/dL   Albumin 4.6 3.6 - 4.8 g/dL   Globulin, Total 2.3 1.5 - 4.5 g/dL   Albumin/Globulin Ratio 2.0 1.2 - 2.2   Bilirubin Total 0.4 0.0 - 1.2 mg/dL   Alkaline Phosphatase 61 39 - 117 IU/L   AST 36 0 - 40 IU/L   ALT 29 0 - 44 IU/L  CBC with Differential/Platelet  Result Value Ref Range   WBC 5.6 3.4 - 10.8 x10E3/uL   RBC 4.71 4.14 - 5.80 x10E6/uL   Hemoglobin 14.8 13.0 - 17.7 g/dL   Hematocrit 43.9 37.5 - 51.0 %   MCV 93 79 - 97 fL   MCH 31.4 26.6 - 33.0 pg   MCHC 33.7 31.5 - 35.7 g/dL   RDW 13.6 12.3 - 15.4 %   Platelets 207 150 - 379 x10E3/uL   Neutrophils 38 Not Estab. %   Lymphs 46 Not Estab. %   Monocytes 10 Not Estab. %   Eos 6 Not Estab. %   Basos 0 Not Estab. %   Neutrophils Absolute 2.1 1.4 - 7.0 x10E3/uL   Lymphocytes Absolute 2.5 0.7 - 3.1 x10E3/uL   Monocytes Absolute 0.6 0.1 - 0.9 x10E3/uL   EOS (ABSOLUTE) 0.3 0.0 - 0.4 x10E3/uL   Basophils Absolute 0.0 0.0 - 0.2 x10E3/uL   Immature Granulocytes 0 Not Estab. %   Immature Grans (Abs) 0.0 0.0 - 0.1 x10E3/uL  Lipid Panel w/o Chol/HDL Ratio  Result Value Ref Range   Cholesterol, Total 133 100 - 199 mg/dL   Triglycerides 60 0 - 149 mg/dL   HDL 56 >39 mg/dL   VLDL Cholesterol Cal 12 5 - 40 mg/dL   LDL Calculated 65 0 - 99 mg/dL  TSH  Result Value Ref Range   TSH 1.770 0.450 - 4.500 uIU/mL  UA/M w/rflx Culture, Routine  Result Value Ref Range   Specific Gravity, UA 1.015 1.005 - 1.030    pH, UA 6.5 5.0 - 7.5   Color, UA Yellow Yellow   Appearance Ur Clear Clear   Leukocytes, UA Negative Negative   Protein, UA Negative Negative/Trace   Glucose, UA Negative Negative   Ketones, UA Negative Negative   RBC, UA Trace (A) Negative   Bilirubin, UA Negative Negative   Urobilinogen, Ur 0.2 0.2 - 1.0 mg/dL   Nitrite, UA Negative Negative   Microscopic Examination See below:  Hepatitis B surface antibody  Result Value Ref Range   Hepatitis B Surf Ab Quant <3.1 (L) Immunity>9.9 mIU/mL  Hepatitis, Acute  Result Value Ref Range   Hep A IgM Negative Negative   Hepatitis B Surface Ag Negative Negative   Hep B C IgM Negative Negative   Hep C Virus Ab <0.1 0.0 - 0.9 s/co ratio  HM COLONOSCOPY  Result Value Ref Range   [facility] Colonoscopy Patient Reported See Report (in chart), Patient Reported      Assessment & Plan:   Problem List Items Addressed This Visit    None    Visit Diagnoses    Acute bronchitis, unspecified organism    -  Primary   Will treat with prednisone, azithromycin, tessalon perles and tussionex. Recheck lungs in about 10 days. Call with any concern.        Follow up plan: Return in about 2 weeks (around 05/08/2017) for lung recheck.
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
## 2017-04-24 NOTE — Patient Instructions (Signed)
Trazodone tablets What is this medicine? TRAZODONE (TRAZ oh done) is used to treat depression. This medicine may be used for other purposes; ask your health care provider or pharmacist if you have questions. COMMON BRAND NAME(S): Desyrel What should I tell my health care provider before I take this medicine? They need to know if you have any of these conditions: -attempted suicide or thinking about it -bipolar disorder -bleeding problems -glaucoma -heart disease, or previous heart attack -irregular heart beat -kidney or liver disease -low levels of sodium in the blood -an unusual or allergic reaction to trazodone, other medicines, foods, dyes or preservatives -pregnant or trying to get pregnant -breast-feeding How should I use this medicine? Take this medicine by mouth with a glass of water. Follow the directions on the prescription label. Take this medicine shortly after a meal or a light snack. Take your medicine at regular intervals. Do not take your medicine more often than directed. Do not stop taking this medicine suddenly except upon the advice of your doctor. Stopping this medicine too quickly may cause serious side effects or your condition may worsen. A special MedGuide will be given to you by the pharmacist with each prescription and refill. Be sure to read this information carefully each time. Talk to your pediatrician regarding the use of this medicine in children. Special care may be needed. Overdosage: If you think you have taken too much of this medicine contact a poison control center or emergency room at once. NOTE: This medicine is only for you. Do not share this medicine with others. What if I miss a dose? If you miss a dose, take it as soon as you can. If it is almost time for your next dose, take only that dose. Do not take double or extra doses. What may interact with this medicine? Do not take this medicine with any of the following medications: -certain medicines  for fungal infections like fluconazole, itraconazole, ketoconazole, posaconazole, voriconazole -cisapride -dofetilide -dronedarone -linezolid -MAOIs like Carbex, Eldepryl, Marplan, Nardil, and Parnate -mesoridazine -methylene blue (injected into a vein) -pimozide -saquinavir -thioridazine -ziprasidone This medicine may also interact with the following medications: -alcohol -antiviral medicines for HIV or AIDS -aspirin and aspirin-like medicines -barbiturates like phenobarbital -certain medicines for blood pressure, heart disease, irregular heart beat -certain medicines for depression, anxiety, or psychotic disturbances -certain medicines for migraine headache like almotriptan, eletriptan, frovatriptan, naratriptan, rizatriptan, sumatriptan, zolmitriptan -certain medicines for seizures like carbamazepine and phenytoin -certain medicines for sleep -certain medicines that treat or prevent blood clots like dalteparin, enoxaparin, warfarin -digoxin -fentanyl -lithium -NSAIDS, medicines for pain and inflammation, like ibuprofen or naproxen -other medicines that prolong the QT interval (cause an abnormal heart rhythm) -rasagiline -supplements like St. John's wort, kava kava, valerian -tramadol -tryptophan This list may not describe all possible interactions. Give your health care provider a list of all the medicines, herbs, non-prescription drugs, or dietary supplements you use. Also tell them if you smoke, drink alcohol, or use illegal drugs. Some items may interact with your medicine. What should I watch for while using this medicine? Tell your doctor if your symptoms do not get better or if they get worse. Visit your doctor or health care professional for regular checks on your progress. Because it may take several weeks to see the full effects of this medicine, it is important to continue your treatment as prescribed by your doctor. Patients and their families should watch out for new  or worsening thoughts of suicide or depression. Also   watch out for sudden changes in feelings such as feeling anxious, agitated, panicky, irritable, hostile, aggressive, impulsive, severely restless, overly excited and hyperactive, or not being able to sleep. If this happens, especially at the beginning of treatment or after a change in dose, call your health care professional. You may get drowsy or dizzy. Do not drive, use machinery, or do anything that needs mental alertness until you know how this medicine affects you. Do not stand or sit up quickly, especially if you are an older patient. This reduces the risk of dizzy or fainting spells. Alcohol may interfere with the effect of this medicine. Avoid alcoholic drinks. This medicine may cause dry eyes and blurred vision. If you wear contact lenses you may feel some discomfort. Lubricating drops may help. See your eye doctor if the problem does not go away or is severe. Your mouth may get dry. Chewing sugarless gum, sucking hard candy and drinking plenty of water may help. Contact your doctor if the problem does not go away or is severe. What side effects may I notice from receiving this medicine? Side effects that you should report to your doctor or health care professional as soon as possible: -allergic reactions like skin rash, itching or hives, swelling of the face, lips, or tongue -elevated mood, decreased need for sleep, racing thoughts, impulsive behavior -confusion -fast, irregular heartbeat -feeling faint or lightheaded, falls -feeling agitated, angry, or irritable -loss of balance or coordination -painful or prolonged erections -restlessness, pacing, inability to keep still -suicidal thoughts or other mood changes -tremors -trouble sleeping -seizures -unusual bleeding or bruising Side effects that usually do not require medical attention (report to your doctor or health care professional if they continue or are bothersome): -change in  sex drive or performance -change in appetite or weight -constipation -headache -muscle aches or pains -nausea This list may not describe all possible side effects. Call your doctor for medical advice about side effects. You may report side effects to FDA at 1-800-FDA-1088. Where should I keep my medicine? Keep out of the reach of children. Store at room temperature between 15 and 30 degrees C (59 to 86 degrees F). Protect from light. Keep container tightly closed. Throw away any unused medicine after the expiration date. NOTE: This sheet is a summary. It may not cover all possible information. If you have questions about this medicine, talk to your doctor, pharmacist, or health care provider.  2018 Elsevier/Gold Standard (2015-07-30 16:57:05)  

## 2017-04-25 ENCOUNTER — Encounter: Payer: Self-pay | Admitting: Psychiatry

## 2017-05-01 ENCOUNTER — Ambulatory Visit (INDEPENDENT_AMBULATORY_CARE_PROVIDER_SITE_OTHER): Payer: Medicare Other | Admitting: Urology

## 2017-05-01 ENCOUNTER — Encounter: Payer: Self-pay | Admitting: Urology

## 2017-05-01 VITALS — BP 151/92 | HR 90 | Ht 68.0 in | Wt 192.2 lb

## 2017-05-01 DIAGNOSIS — R351 Nocturia: Secondary | ICD-10-CM

## 2017-05-01 DIAGNOSIS — N401 Enlarged prostate with lower urinary tract symptoms: Secondary | ICD-10-CM | POA: Diagnosis not present

## 2017-05-01 DIAGNOSIS — N138 Other obstructive and reflux uropathy: Secondary | ICD-10-CM | POA: Diagnosis not present

## 2017-05-01 DIAGNOSIS — N529 Male erectile dysfunction, unspecified: Secondary | ICD-10-CM

## 2017-05-01 LAB — BLADDER SCAN AMB NON-IMAGING

## 2017-05-01 MED ORDER — MIRABEGRON ER 25 MG PO TB24: 25.0000 mg | ORAL_TABLET | Freq: Every day | ORAL | 11 refills | Status: DC

## 2017-05-01 NOTE — Progress Notes (Signed)
05/01/2017 10:48 AM   Matthew Maldonado 28-Jan-1951 [id number]  Referring provider: Valerie Roys, DO [place], [place] [address]  Chief Complaint  Patient presents with  . Follow-up    38yr f/u has not been taking med that was prescribed due to cost   6 HPI: Patient is a 67 year old Caucasian male with BPH with LU TS, nocturia and ED who presents today for a one year follow up.    BPH WITH LUTS His IPSS score today is 17, which is moderate lower urinary tract symptomatology. He is mostly satisfied with his quality life due to his urinary symptoms.  His PVR is 30 mL.  His previous IPSS score was 7/1.  His previous PVR is 31 mL.  His complaints at his this visit are urgency, nocturia and intermittency.  He feels that his symptoms are controlled with Myrbetriq 25 mg daily, but he found the Myrbetriq cost prohibitive.  He denies any dysuria, hematuria or suprapubic pain.   He also denies any recent fevers, chills, nausea or vomiting.  He does not have a family history of PCa.  IPSS    Row Name 05/01/17 1000         International Prostate Symptom Score   How often have you had the sensation of not emptying your bladder?  About half the time     How often have you had to urinate less than every two hours?  About half the time     How often have you found you stopped and started again several times when you urinated?  About half the time     How often have you found it difficult to postpone urination?  Less than 1 in 5 times     How often have you had a weak urinary stream?  About half the time     How often have you had to strain to start urination?  Less than half the time     How many times did you typically get up at night to urinate?  2 Times     Total IPSS Score  17        Score:  1-7 Mild 8-19 Moderate 20-35 Severe  Erectile dysfunction His SHIM score is 11, which is moderate erectile dysfunction.   He has been having difficulty with erections for the last several years.   His previous SHIM score was 16.  His major complaint is lack of firmness with erections.  His libido is preserved.   His risk factors for ED are age, BPH, HLD, anxiety and antidepressants.  He denies any painful erections or curvatures with his erections.   He has tried Cialis in the past, but he finds it cost prohibitive.  He and his wife are in counseling at this time.   SHIM    Row Name 05/01/17 1018         SHIM: Over the last 6 months:   How do you rate your confidence that you could get and keep an erection?  Very Low     When you had erections with sexual stimulation, how often were your erections hard enough for penetration (entering your partner)?  Sometimes (about half the time)     During sexual intercourse, how often were you able to maintain your erection after you had penetrated (entered) your partner?  A Few Times (much less than half the time)     During sexual intercourse, how difficult was it to maintain your  erection to completion of intercourse?  Difficult     When you attempted sexual intercourse, how often was it satisfactory for you?  A Few Times (much less than half the time)       SHIM Total Score   SHIM  11        Score: 1-7 Severe ED 8-11 Moderate ED 12-16 Mild-Moderate ED 17-21 Mild ED 22-25 No ED   Nocturia Patient complains of getting up 2 to 3 times nightly.   He found the Myrbetriq very effective.    PMH: Past Medical History:  Diagnosis Date  . Anxiety   . 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   Marland Kitchen BPH (benign prostatic hyperplasia)   . Depression   . Insomnia   . Minimal change disease   . Nocturia     Surgical History: History reviewed. No pertinent surgical history.  Home Medications:  Allergies as of 05/01/2017   No Known Allergies     Medication List        Accurate as of 05/01/17 10:48 AM. Always use your most recent med list.          aspirin EC 81 MG tablet Take 81 mg by  mouth daily.   atorvastatin 80 MG tablet Commonly known as:  LIPITOR Take 1 tablet (80 mg total) by mouth daily at 6 PM.   atovaquone-proguanil 250-100 MG Tabs tablet Commonly known as:  MALARONE Take 1 tablet by mouth daily. Start 2 days prior to [employer] trip. Take daily on full stomach until complete   azithromycin 500 MG tablet Commonly known as:  ZITHROMAX Take 1 tablet (500 mg total) by mouth daily. If you have 3+ loose stools in 24hr. Can stop taking if diarrhea resolves   azithromycin 250 MG tablet Commonly known as:  ZITHROMAX 2 tabs today, 1 tab daily for 4 days   benzonatate 200 MG capsule Commonly known as:  TESSALON Take 1 capsule (200 mg total) by mouth 2 (two) times daily as needed for cough.   busPIRone 15 MG tablet Commonly known as:  BUSPAR Take 1 tablet (15 mg total) by mouth 2 (two) times daily.   chlorpheniramine-HYDROcodone 10-8 MG/5ML Suer Commonly known as:  TUSSIONEX PENNKINETIC ER Take 5 mLs by mouth at bedtime as needed.   co-enzyme Q-10 30 MG capsule Take 30 mg by mouth daily.   esomeprazole 20 MG capsule Commonly known as:  NEXIUM Take 1 capsule (20 mg total) by mouth daily at 12 noon.   fluticasone 50 MCG/ACT nasal spray Commonly known as:  FLONASE Place 2 sprays into both nostrils daily.   ketoconazole 2 % shampoo Commonly known as:  NIZORAL   loratadine 10 MG tablet Commonly known as:  CLARITIN Take by mouth.   metoprolol succinate 25 MG 24 hr tablet Commonly known as:  TOPROL-XL Take 1 tablet (25 mg total) by mouth daily.   mirabegron ER 25 MG Tb24 tablet Commonly known as:  MYRBETRIQ Take 1 tablet (25 mg total) by mouth daily.   multivitamin with minerals tablet Take 1 tablet by mouth daily.   oseltamivir 75 MG capsule Commonly known as:  TAMIFLU Take 1 capsule (75 mg total) by mouth 2 (two) times daily.   predniSONE 10 MG tablet Commonly known as:  DELTASONE 6 tabs today, 5 tabs tomorrow, decrease by 1 every day  until gone.   pseudoephedrine-guaifenesin 60-600 MG 12 hr tablet Commonly known as:  MUCINEX  D Take by mouth.   traZODone 50 MG tablet Commonly known as:  DESYREL Take 1-2 tablets (50-100 mg total) by mouth at bedtime as needed for sleep.   venlafaxine XR 75 MG 24 hr capsule Commonly known as:  EFFEXOR XR Take 1 capsule (75 mg total) by mouth daily with breakfast.       Allergies: No Known Allergies  Family History: Family History  Problem Relation Age of Onset  . Osteoporosis Mother   . Heart disease Father   . Heart disease Brother   . Alcohol abuse Brother   . Kidney disease Neg Hx   . Prostate cancer Neg Hx     Social History:  reports that he has been smoking cigars.  he has never used smokeless tobacco. He reports that he drinks about 10.8 oz of alcohol per week. He reports that he does not use drugs.  ROS: UROLOGY Frequent Urination?: No Hard to postpone urination?: Yes Burning/pain with urination?: No Get up at night to urinate?: Yes Leakage of urine?: No Urine stream starts and stops?: Yes Trouble starting stream?: No Do you have to strain to urinate?: No Blood in urine?: No Urinary tract infection?: No Sexually transmitted disease?: No Injury to kidneys or bladder?: No Painful intercourse?: No Weak stream?: No Erection problems?: No Penile pain?: No  Gastrointestinal Nausea?: No Vomiting?: No Indigestion/heartburn?: No Diarrhea?: No Constipation?: No  Constitutional Fever: No Night sweats?: No Weight loss?: No Fatigue?: No  Skin Skin rash/lesions?: No Itching?: No  Eyes Blurred vision?: No Double vision?: No  Ears/Nose/Throat Sore throat?: No Sinus problems?: No  Hematologic/Lymphatic Swollen glands?: No Easy bruising?: No  Cardiovascular Leg swelling?: No Chest pain?: No  Respiratory Cough?: No Shortness of breath?: No  Endocrine Excessive thirst?: No  Musculoskeletal Back pain?: No Joint pain?:  No  Neurological Headaches?: No Dizziness?: No  Psychologic Depression?: No Anxiety?: No  Physical Exam: BP (!) 151/92 (BP Location: Right Arm, Patient Position: Sitting, Cuff Size: Large)   Pulse 90   Ht 5\' 8"  (1.727 m)   Wt 192 lb 3.2 oz (87.2 kg)   SpO2 99%   BMI 29.22 kg/m   Constitutional: Well nourished. Alert and oriented, No acute distress. HEENT:  AT, moist mucus membranes. Trachea midline, no masses. Cardiovascular: No clubbing, cyanosis, or edema. Respiratory: Normal respiratory effort, no increased work of breathing. GI: Abdomen is soft, non tender, non distended, no abdominal masses. Liver and spleen not palpable.  No hernias appreciated.  Stool sample for occult testing is not indicated.   GU: No CVA tenderness.  No bladder fullness or masses.  Patient with circumcised phallus.  Urethral meatus is patent.  No penile discharge. No penile lesions or rashes. Scrotum without lesions, cysts, rashes and/or edema.  Testicles are located scrotally bilaterally. No masses are appreciated in the testicles. Left and right epididymis are normal. Rectal: Patient with  normal sphincter tone. Anus and perineum without scarring or rashes. No rectal masses are appreciated. Prostate is approximately 55 grams, no nodules are appreciated. Seminal vesicles are normal. Skin: No rashes, bruises or suspicious lesions. Lymph: No cervical or inguinal adenopathy. Neurologic: Grossly intact, no focal deficits, moving all 4 extremities. Psychiatric: Normal mood and affect.   Laboratory Data: Lab Results  Component Value Date   WBC 5.6 03/21/2017   HGB 14.8 03/21/2017   HCT 43.9 03/21/2017   MCV 93 03/21/2017   PLT 207 03/21/2017    Lab Results  Component Value Date   CREATININE 0.84 03/21/2017  PSA History  0.8 ng/mL on 10/30/2014  1.1 ng/mL on 01/18/2016  1.1 ng/mL on 01/31/2017  Pertinent Imaging: Results for VIVAAN, HELSETH (MRN [id number]) as of 05/01/2017 10:38  Ref. Range  05/01/2017 10:21  Scan Result Unknown 30ML     Assessment & Plan:    1. BPH with LUTS  - IPSS score is 17/2, it is worsening  - Continue conservative management, avoiding bladder irritants and timed voiding's  - continue Myrbetriq 25 mg daily; refills given - hopefully not cost prohibitive   - RTC in one for IPSS and PVR   - BLADDER SCAN AMB NON-IMAGING  2. Nocturia  - Patient found the Myrbetriq effective  3. Erectile dysfunction  - SHIM score is 11, it is worsening  - he and his wife are in counseling   - Repeat SHIM in one year  Return in about 1 year (around 05/01/2018) for IPSS, SHIM, PSA and exam.  [name], [facility]  [facility] [address] [place], [place] [address] [phone]

## 2017-05-04 ENCOUNTER — Encounter: Payer: Self-pay | Admitting: Family Medicine

## 2017-05-04 ENCOUNTER — Ambulatory Visit (INDEPENDENT_AMBULATORY_CARE_PROVIDER_SITE_OTHER): Payer: Medicare Other | Admitting: Family Medicine

## 2017-05-04 VITALS — BP 114/78 | HR 70 | Temp 98.8°F | Wt 197.3 lb

## 2017-05-04 DIAGNOSIS — J0100 Acute maxillary sinusitis, unspecified: Secondary | ICD-10-CM

## 2017-05-04 MED ORDER — AMOXICILLIN-POT CLAVULANATE 875-125 MG PO TABS: 1.0000 | ORAL_TABLET | Freq: Two times a day (BID) | ORAL | 0 refills | Status: DC

## 2017-05-04 MED ORDER — PREDNISONE 50 MG PO TABS: 50.0000 mg | ORAL_TABLET | Freq: Every day | ORAL | 0 refills | Status: DC

## 2017-05-04 NOTE — Progress Notes (Signed)
BP 114/78 (BP Location: Left Arm, Patient Position: Sitting, Cuff Size: Normal)   Pulse 70   Temp 98.8 F (37.1 C)   Wt 197 lb 5 oz (89.5 kg)   SpO2 95%   BMI 30.00 kg/m    Subjective:    Patient ID: Matthew Maldonado, male    DOB: 06/19/1950, 67 y.o.   MRN: [id number]  HPI: KEJON FEILD is a 67 y.o. male  Chief Complaint  Patient presents with  . lung recheck   He is feeling better with his breathing, but still having a lot of pressure in his head and having bad headaches. He has not had any fevers, but continues with a lot of congestion and pressure in his face. Breathing is better. No SOB. No wheezing. No other concerns.   Relevant past medical, surgical, family and social history reviewed and updated as indicated. Interim medical history since our last visit reviewed. Allergies and medications reviewed and updated.  Review of Systems  Constitutional: Negative.   HENT: Negative.   Respiratory: Negative.   Cardiovascular: Negative.   Psychiatric/Behavioral: Negative.     Per HPI unless specifically indicated above     Objective:    BP 114/78 (BP Location: Left Arm, Patient Position: Sitting, Cuff Size: Normal)   Pulse 70   Temp 98.8 F (37.1 C)   Wt 197 lb 5 oz (89.5 kg)   SpO2 95%   BMI 30.00 kg/m   Wt Readings from Last 3 Encounters:  05/04/17 197 lb 5 oz (89.5 kg)  05/01/17 192 lb 3.2 oz (87.2 kg)  04/24/17 191 lb 12.8 oz (87 kg)    Physical Exam  Constitutional: He is oriented to person, place, and time. He appears well-developed and well-nourished. No distress.  HENT:  Head: Normocephalic and atraumatic.  Right Ear: Hearing, tympanic membrane, external ear and ear canal normal.  Left Ear: Hearing, tympanic membrane, external ear and ear canal normal.  Nose: Mucosal edema and rhinorrhea present. Right sinus exhibits maxillary sinus tenderness.  Mouth/Throat: Uvula is midline, oropharynx is clear and moist and mucous membranes are normal. No oropharyngeal  exudate.  Eyes: Conjunctivae, EOM and lids are normal. Pupils are equal, round, and reactive to light. Right eye exhibits no discharge. Left eye exhibits no discharge. No scleral icterus.  Neck: Normal range of motion. Neck supple. No JVD present. No tracheal deviation present. No thyromegaly present.  Cardiovascular: Normal rate, regular rhythm, normal heart sounds and intact distal pulses. Exam reveals no gallop and no friction rub.  No murmur heard. Pulmonary/Chest: Effort normal and breath sounds normal. No stridor. No respiratory distress. He has no wheezes. He has no rales. He exhibits no tenderness.  Musculoskeletal: Normal range of motion.  Lymphadenopathy:    He has no cervical adenopathy.  Neurological: He is alert and oriented to person, place, and time.  Skin: Skin is warm, dry and intact. No rash noted. He is not diaphoretic. No erythema. No pallor.  Psychiatric: He has a normal mood and affect. His speech is normal and behavior is normal. Judgment and thought content normal. Cognition and memory are normal.  Nursing note and vitals reviewed.   Results for orders placed or performed in visit on 05/01/17  Bladder Scan (Post Void Residual) in office  Result Value Ref Range   Scan Result 30ML       Assessment & Plan:   Problem List Items Addressed This Visit    None    Visit Diagnoses  Acute non-recurrent maxillary sinusitis    -  Primary   Will treat with 5 day burst of prednisone for congestion. If not better in the next couple of days, fill augmentin. Call with any concerns.    Relevant Medications   predniSONE (DELTASONE) 50 MG tablet   amoxicillin-clavulanate (AUGMENTIN) 875-125 MG tablet       Follow up plan: Return if symptoms worsen or fail to improve.

## 2017-06-20 ENCOUNTER — Encounter: Payer: Self-pay | Admitting: Psychiatry

## 2017-06-20 ENCOUNTER — Ambulatory Visit (INDEPENDENT_AMBULATORY_CARE_PROVIDER_SITE_OTHER): Payer: Medicare Other | Admitting: Psychiatry

## 2017-06-20 VITALS — BP 128/82 | HR 73 | Temp 98.2°F | Wt 194.6 lb

## 2017-06-20 DIAGNOSIS — F338 Other recurrent depressive disorders: Secondary | ICD-10-CM | POA: Diagnosis not present

## 2017-06-20 DIAGNOSIS — F5105 Insomnia due to other mental disorder: Secondary | ICD-10-CM | POA: Diagnosis not present

## 2017-06-20 DIAGNOSIS — F411 Generalized anxiety disorder: Secondary | ICD-10-CM

## 2017-06-20 MED ORDER — VENLAFAXINE HCL ER 75 MG PO CP24: 75.0000 mg | ORAL_CAPSULE | Freq: Every day | ORAL | 0 refills | Status: DC

## 2017-06-20 MED ORDER — HYDROXYZINE PAMOATE 25 MG PO CAPS: 25.0000 mg | ORAL_CAPSULE | Freq: Every evening | ORAL | 1 refills | Status: DC | PRN

## 2017-06-20 MED ORDER — BUSPIRONE HCL 15 MG PO TABS: 15.0000 mg | ORAL_TABLET | Freq: Two times a day (BID) | ORAL | 0 refills | Status: DC

## 2017-06-20 NOTE — Patient Instructions (Signed)
Melatonin oral solid dosage forms What is this medicine? MELATONIN (mel uh TOH nin) is a dietary supplement. It is mostly promoted to help maintain normal sleep patterns. The FDA has not approved this supplement for any medical use. This supplement may be used for other purposes; ask your health care provider or pharmacist if you have questions. This medicine may be used for other purposes; ask your health care provider or pharmacist if you have questions. COMMON BRAND NAME(S): Melatonex What should I tell my health care provider before I take this medicine? They need to know if you have any of these conditions: -cancer -depression or mental illness -diabetes -hormone problems -if you often drink alcohol -immune system problems -liver disease -lung or breathing disease, like asthma -organ transplant -seizure disorder -an unusual or allergic reaction to melatonin, other medicines, foods, dyes, or preservatives -pregnant or trying to get pregnant -breast-feeding How should I use this medicine? Take this supplement by mouth with a glass of water. Do not take with food. This supplement is usually taken 1 or 2 hours before bedtime. After taking this supplement, limit your activities to those needed to prepare for bed. Some products may be chewed or dissolved in the mouth before swallowing. Some tablets or capsules must be swallowed whole; do not cut, crush or chew. Follow the directions on the package labeling, or take as directed by your health care professional. Do not take this supplement more often than directed. Talk to your pediatrician regarding the use of this supplement in children. Special care may be needed. This supplement is not recommended for use in children without a prescription. Overdosage: If you think you have taken too much of this medicine contact a poison control center or emergency room at once. NOTE: This medicine is only for you. Do not share this medicine with  others. What if I miss a dose? If you miss taking your dose at the usual time, skip that dose. If it is almost time for your next dose, take only that dose. Do not take double or extra doses. What may interact with this medicine? Do not take this medicine with any of the following medications: -fluvoxamine -ramelteon -tasimelteon This medicine may also interact with the following medications: -alcohol -caffeine -carbamazepine -certain antibiotics like ciprofloxacin -certain medicines for depression, anxiety, or psychotic disturbances -cimetidine -male hormones, like estrogens and birth control pills, patches, rings, or injections -methoxsalen -nifedipine -other medications for sleep -other herbal or dietary supplements -phenobarbital -rifampin -smoking tobacco -tamoxifen -warfarin This list may not describe all possible interactions. Give your health care provider a list of all the medicines, herbs, non-prescription drugs, or dietary supplements you use. Also tell them if you smoke, drink alcohol, or use illegal drugs. Some items may interact with your medicine. What should I watch for while using this medicine? See your doctor if your symptoms do not get better or if they get worse. Do not take this supplement for more than 2 weeks unless your doctor tells you to. You may get drowsy or dizzy. Do not drive, use machinery, or do anything that needs mental alertness until you know how this medicine affects you. Do not stand or sit up quickly, especially if you are an older patient. This reduces the risk of dizzy or fainting spells. Alcohol may interfere with the effect of this medicine. Avoid alcoholic drinks. Talk to your doctor before you use this supplement if you are currently being treated for an emotional, mental, or sleep problem. This medicine  may interfere with your treatment. Herbal or dietary supplements are not regulated like medicines. Rigid quality control standards are  not required for dietary supplements. The purity and strength of these products can vary. The safety and effect of this dietary supplement for a certain disease or illness is not well known. This product is not intended to diagnose, treat, cure or prevent any disease. The Food and Drug Administration suggests the following to help consumers protect themselves: -Always read product labels and follow directions. -Natural does not mean a product is safe for humans to take. -Look for products that include USP after the ingredient name. This means that the manufacturer followed the standards of the [place] Pharmacopoeia. -Supplements made or sold by a nationally known food or drug company are more likely to be made under tight controls. You can write to the company for more information about how the product was made. What side effects may I notice from receiving this medicine? Side effects that you should report to your doctor or health care professional as soon as possible: -allergic reactions like skin rash, itching or hives, swelling of the face, lips, or tongue -breathing problems -confusion -depressed mood, irritable, or other changes in moods or behaviors -feeling faint or lightheaded, falls -increased blood pressure -irregular or missed menstrual periods -signs and symptoms of liver injury like dark yellow or brown urine; general ill feeling or flu-like symptoms; light-colored stools; loss of appetite; nausea; right upper belly pain; unusually weak or tired; yellowing of the eyes or skin -trouble staying awake or alert during the day -unusual activities while you are still asleep like driving, eating, making phone calls -unusual bleeding or bruising Side effects that usually do not require medical attention (report to your doctor or health care professional if they continue or are bothersome): -dizziness -drowsiness -headache -hot flashes -nausea -tiredness -unusual dreams or  nightmares -upset stomach This list may not describe all possible side effects. Call your doctor for medical advice about side effects. You may report side effects to FDA at 1-800-FDA-1088. Where should I keep my medicine? Keep out of the reach of children. Store at room temperature or as directed on the package label. Protect from moisture. Throw away any unused supplement after the expiration date. NOTE: This sheet is a summary. It may not cover all possible information. If you have questions about this medicine, talk to your doctor, pharmacist, or health care provider.  2018 Elsevier/Gold Standard (2015-11-23 14:38:22) Hydroxyzine capsules or tablets What is this medicine? HYDROXYZINE (hye [place] i zeen) is an antihistamine. This medicine is used to treat allergy symptoms. It is also used to treat anxiety and tension. This medicine can be used with other medicines to induce sleep before surgery. This medicine may be used for other purposes; ask your health care provider or pharmacist if you have questions. COMMON BRAND NAME(S): ANX, Atarax, Rezine, Vistaril What should I tell my health care provider before I take this medicine? They need to know if you have any of these conditions: -any chronic illness -difficulty passing urine -glaucoma -heart disease -kidney disease -liver disease -lung disease -an unusual or allergic reaction to hydroxyzine, cetirizine, other medicines, foods, dyes, or preservatives -pregnant or trying to get pregnant -breast-feeding How should I use this medicine? Take this medicine by mouth with a full glass of water. Follow the directions on the prescription label. You may take this medicine with food or on an empty stomach. Take your medicine at regular intervals. Do not take your medicine  more often than directed. Talk to your pediatrician regarding the use of this medicine in children. Special care may be needed. While this drug may be prescribed for children as  young as 83 years of age for selected conditions, precautions do apply. Patients over 85 years old may have a stronger reaction and need a smaller dose. Overdosage: If you think you have taken too much of this medicine contact a poison control center or emergency room at once. NOTE: This medicine is only for you. Do not share this medicine with others. What if I miss a dose? If you miss a dose, take it as soon as you can. If it is almost time for your next dose, take only that dose. Do not take double or extra doses. What may interact with this medicine? -alcohol -barbiturate medicines for sleep or seizures -medicines for colds, allergies -medicines for depression, anxiety, or emotional disturbances -medicines for pain -medicines for sleep -muscle relaxants This list may not describe all possible interactions. Give your health care provider a list of all the medicines, herbs, non-prescription drugs, or dietary supplements you use. Also tell them if you smoke, drink alcohol, or use illegal drugs. Some items may interact with your medicine. What should I watch for while using this medicine? Tell your doctor or health care professional if your symptoms do not improve. You may get drowsy or dizzy. Do not drive, use machinery, or do anything that needs mental alertness until you know how this medicine affects you. Do not stand or sit up quickly, especially if you are an older patient. This reduces the risk of dizzy or fainting spells. Alcohol may interfere with the effect of this medicine. Avoid alcoholic drinks. Your mouth may get dry. Chewing sugarless gum or sucking hard candy, and drinking plenty of water may help. Contact your doctor if the problem does not go away or is severe. This medicine may cause dry eyes and blurred vision. If you wear contact lenses you may feel some discomfort. Lubricating drops may help. See your eye doctor if the problem does not go away or is severe. If you are  receiving skin tests for allergies, tell your doctor you are using this medicine. What side effects may I notice from receiving this medicine? Side effects that you should report to your doctor or health care professional as soon as possible: -fast or irregular heartbeat -difficulty passing urine -seizures -slurred speech or confusion -tremor Side effects that usually do not require medical attention (report to your doctor or health care professional if they continue or are bothersome): -constipation -drowsiness -fatigue -headache -stomach upset This list may not describe all possible side effects. Call your doctor for medical advice about side effects. You may report side effects to FDA at 1-800-FDA-1088. Where should I keep my medicine? Keep out of the reach of children. Store at room temperature between 15 and 30 degrees C (59 and 86 degrees F). Keep container tightly closed. Throw away any unused medicine after the expiration date. NOTE: This sheet is a summary. It may not cover all possible information. If you have questions about this medicine, talk to your doctor, pharmacist, or health care provider.  2018 Elsevier/Gold Standard (2007-07-13 14:50:59)

## 2017-06-20 NOTE — Progress Notes (Signed)
[facility] Progress Note  06/20/2017 4:35 PM Matthew Maldonado  MRN:  [id number]  Chief Complaint: ' I am still not sleeping." Chief Complaint    Follow-up; Medication Refill     HPI: Matthew Maldonado is a 67 year old Caucasian male, retired, lives in [place], married, has a history of depression, anxiety, anger issues, sleep problems, multiple medical issues including paroxysmal supraventricular tachycardia, carotid artery syndrome, history of TIAs, BPH, history of nephrotic syndrome and past history of being on steroids, presented to the clinic today for a follow-up visit.   Patient just returned from a long vacation to [place] 1 week ago.  Patient reports he enjoyed his trip with his wife.  He reports he went all around [place], [place], [place] and [place].  Patient reports he and his wife enjoyed the time together.  Patient  report that his mood symptoms are better now.  He does not seem to be too anxious or depressed at this time.  He continues to take the Effexor 75 mg along with BuSpar 15 mg.  Patient reports he is tolerating the medications well.  He denies any side effects.  He does report some struggles with completing projects around the house.  He reports ADD runs in his family and wonders whether he has it.  He reports jet lag after coming back.  He reports he went to bed this morning at 4 AM and that has been making him kind of lethargic during the day.  He reports he took the trazodone initially for sleep but that did not help.  He also reports going up on the trazodone dose made him groggy during the day.  He looks forward to his therapy appointment with Ms. Royal Piedra here in clinic.  He has an upcoming appointment on 11 April.  Visit Diagnosis: R/O ADHD   ICD-10-CM   1. GAD (generalized anxiety disorder) F41.1 busPIRone (BUSPAR) 15 MG tablet    venlafaxine XR (EFFEXOR XR) 75 MG 24 hr capsule    hydrOXYzine (VISTARIL) 25 MG capsule  2. Seasonal affective disorder (HCC) F33.8  busPIRone (BUSPAR) 15 MG tablet    venlafaxine XR (EFFEXOR XR) 75 MG 24 hr capsule  3. Insomnia due to mental disorder F51.05     Past Psychiatric History: He used to follow-up with psychologist in the past.  Most recently his PCP started him on an antidepressant-Effexor, BuSpar.  He denies any inpatient mental health admissions in the past.  Denies any suicidality in the past.  Past trials of BuSpar and Effexor.  Past Medical History:  Past Medical History:  Diagnosis Date  . Anxiety   . 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   Marland Kitchen BPH (benign prostatic hyperplasia)   . Depression   . Insomnia   . Minimal change disease   . Nocturia    History reviewed. No pertinent surgical history.  Family Psychiatric History: Brother-ADHD, nephew-ADHD.  Patient denies any history of bipolar disorder in his family.  Patient denies any suicidality in his family.  Patient denies any substance abuse history in his family.  Family History:  Family History  Problem Relation Age of Onset  . Osteoporosis Mother   . Heart disease Father   . Heart disease Brother   . Alcohol abuse Brother   . Kidney disease Neg Hx   . Prostate cancer Neg Hx     Social History: Currently lives in [place].  He lives with  his wife.  He is the primary caretaker of his wife who has health issues.  She was in a significant motor vehicle crash 10 years ago and has visual deficits, cognitive changes.  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  He reports he has 4 years of college but did not graduate.  He denies having children. Social History   Socioeconomic History  . Marital status: Married    Spouse name: quein  . Number of children: 0  . Years of education: Not on file  . Highest education level: Some college, no degree  Occupational History    Comment: retired  Scientific laboratory technician  . Financial resource strain: Not hard at all  . Food  insecurity:    Worry: Never true    Inability: Never true  . Transportation needs:    Medical: No    Non-medical: No  Tobacco Use  . Smoking status: Current Some Day Smoker    Types: Cigars  . Smokeless tobacco: Never Used  Substance and Sexual Activity  . Alcohol use: Yes    Alcohol/week: 10.8 oz    Types: 3 Standard drinks or equivalent, 1 Cans of beer, 14 Shots of liquor per week  . Drug use: No  . Sexual activity: Not Currently  Lifestyle  . Physical activity:    Days per week: 0 days    Minutes per session: 0 min  . Stress: Rather much  Relationships  . Social connections:    Talks on phone: Three times a week    Gets together: Never    Attends religious service: Never    Active member of club or organization: Yes    Attends meetings of clubs or organizations: More than 4 times per year    Relationship status: Married  Other Topics Concern  . Not on file  Social History Narrative  . Not on file    Allergies: No Known Allergies  Metabolic Disorder Labs: No results found for: HGBA1C, MPG No results found for: PROLACTIN Lab Results  Component Value Date   CHOL 133 03/21/2017   TRIG 60 03/21/2017   HDL 56 03/21/2017   LDLCALC 65 03/21/2017   Lab Results  Component Value Date   TSH 1.770 03/21/2017   TSH 1.407 11/26/2015    Therapeutic Level Labs: No results found for: LITHIUM No results found for: VALPROATE No components found for:  CBMZ  Current Medications: Current Outpatient Medications  Medication Sig Dispense Refill  . aspirin EC 81 MG tablet Take 81 mg by mouth daily.    Marland Kitchen atorvastatin (LIPITOR) 80 MG tablet Take 1 tablet (80 mg total) by mouth daily at 6 PM. 90 tablet 1  . atovaquone-proguanil (MALARONE) 250-100 MG TABS tablet Take 1 tablet by mouth daily. Start 2 days prior to [employer] trip. Take daily on full stomach until complete 16 tablet 0  . busPIRone (BUSPAR) 15 MG tablet Take 1 tablet (15 mg total) by mouth 2 (two) times daily. 180  tablet 0  . co-enzyme Q-10 30 MG capsule Take 30 mg by mouth daily.     Marland Kitchen esomeprazole (NEXIUM) 20 MG capsule Take 1 capsule (20 mg total) by mouth daily at 12 noon. 90 capsule 1  . fluticasone (FLONASE) 50 MCG/ACT nasal spray Place 2 sprays into both nostrils daily. 48 g 3  . ketoconazole (NIZORAL) 2 % shampoo     . loratadine (CLARITIN) 10 MG tablet Take by mouth.    Marland Kitchen  metoprolol succinate (TOPROL-XL) 25 MG 24 hr tablet Take 1 tablet (25 mg total) by mouth daily. 90 tablet 1  . mirabegron ER (MYRBETRIQ) 25 MG TB24 tablet Take 1 tablet (25 mg total) by mouth daily. 30 tablet 11  . Multiple Vitamins-Minerals (MULTIVITAMIN WITH MINERALS) tablet Take 1 tablet by mouth daily.    Marland Kitchen venlafaxine XR (EFFEXOR XR) 75 MG 24 hr capsule Take 1 capsule (75 mg total) by mouth daily with breakfast. 90 capsule 0  . hydrOXYzine (VISTARIL) 25 MG capsule Take 1-3 capsules (25-75 mg total) by mouth at bedtime as needed (for svere anxiety sx and sleep). 90 capsule 1   No current facility-administered medications for this visit.      Musculoskeletal: Strength & Muscle Tone: within normal limits Gait & Station: normal Patient leans: N/A  Psychiatric Specialty Exam: Review of Systems  Psychiatric/Behavioral: The patient is nervous/anxious and has insomnia.   All other systems reviewed and are negative.   Blood pressure 128/82, pulse 73, temperature 98.2 F (36.8 C), temperature source Oral, weight 194 lb 9.6 oz (88.3 kg).Body mass index is 29.59 kg/m.  General Appearance: Casual  Eye Contact:  Fair  Speech:  Clear and Coherent  Volume:  Normal  Mood:  Anxious  Affect:  Appropriate  Thought Process:  Goal Directed and Descriptions of Associations: Intact  Orientation:  Full (Time, Place, and Person)  Thought Content: Logical   Suicidal Thoughts:  No  Homicidal Thoughts:  No  Memory:  Immediate;   Fair Recent;   Fair Remote;   Fair  Judgement:  Fair  Insight:  Fair  Psychomotor Activity:  Normal   Concentration:  Concentration: Fair and Attention Span: Fair  Recall:  [employer] of Knowledge: Fair  Language: Fair  Akathisia:  No  Handed:  Right  AIMS (if indicated): na  Assets:  Communication Skills Desire for Improvement Housing Social Support  ADL's:  Intact  Cognition: WNL  Sleep:  Poor   Screenings: PHQ2-9     Office Visit from 03/21/2017 in [facility]  PHQ-2 Total Score  6  PHQ-9 Total Score  18       Assessment and Plan: Kaylin is a 67 year old Caucasian male, lives in [place], married, has a history of depression, insomnia, anger issues, medical problems like history of nephrotic syndrome-resolved, was on steroid for several years in the past, history of TIA, paroxysmal supraventricular tachycardia-stable, presented to the clinic today for a follow-up visit.  Patient just returned from a trip to [place].  Patient reports the trip went well and that has helped his mood symptoms to get better.  He continues to struggle with sleep issues.  Discussed plan as noted below.  Patient also has psychotherapy appointment scheduled with Ms. Royal Piedra in 2 days.  Plan MDD- improving Continue Effexor XR 75 mg p.o. daily Continue BuSpar 15 mg p.o. twice daily Discontinue trazodone for lack of efficacy Start return in 3-5 mg p.o. nightly PHQ 9 equals 4  For anxiety symptoms-continues to have anxiety symptoms at bedtime. Effexor XR 75 mg p.o. daily BuSpar 15 mg p.o. twice daily Start hydroxyzine 25 to 75 mg at bedtime as needed for sleep and anxiety symptoms . GAD 7 equals 8  Insomnia Discussed sleep hygiene He struggles with jet lag since he just returned from [place]. Start melatonin 5 mg p.o. nightly Also provided hydroxyzine 25-75 mg as needed.  Follow-up in clinic with Ms. Peacock for CBT-has an appointment scheduled for  06/22/2017.  Patient is worried about ADD symptoms, reassured patient.  Discussed with him that he can be referred  for an ADHD testing as needed.  Follow-up in clinic in 1 month or sooner if needed.  More than 50 % of the time was spent for psychoeducation and supportive psychotherapy and care coordination.  This note was generated in part or whole with voice recognition software. Voice recognition is usually quite accurate but there are transcription errors that can and very often do occur. I apologize for any typographical errors that were not detected and corrected.       Ursula Alert, MD 06/21/2017, 1:03 PM

## 2017-06-21 ENCOUNTER — Encounter: Payer: Self-pay | Admitting: Psychiatry

## 2017-06-22 ENCOUNTER — Ambulatory Visit: Payer: Medicare Other | Admitting: Licensed Clinical Social Worker

## 2017-06-22 DIAGNOSIS — F411 Generalized anxiety disorder: Secondary | ICD-10-CM

## 2017-06-22 DIAGNOSIS — F5105 Insomnia due to other mental disorder: Secondary | ICD-10-CM
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
## 2017-07-06 DIAGNOSIS — D492 Neoplasm of unspecified behavior of bone, soft tissue, and skin: Secondary | ICD-10-CM | POA: Diagnosis not present

## 2017-07-11 DIAGNOSIS — K1329 Other disturbances of oral epithelium, including tongue: Secondary | ICD-10-CM | POA: Diagnosis not present

## 2017-07-19 ENCOUNTER — Ambulatory Visit: Payer: Medicare Other | Admitting: Psychiatry

## 2017-07-31 ENCOUNTER — Ambulatory Visit: Payer: Medicare Other | Admitting: Licensed Clinical Social Worker

## 2017-08-02 ENCOUNTER — Ambulatory Visit (INDEPENDENT_AMBULATORY_CARE_PROVIDER_SITE_OTHER): Payer: Medicare Other

## 2017-08-02 VITALS — BP 122/70 | HR 60 | Temp 97.9°F | Resp 16 | Ht 68.0 in | Wt 197.6 lb

## 2017-08-02 DIAGNOSIS — Z0000 Encounter for general adult medical examination without abnormal findings: Secondary | ICD-10-CM | POA: Diagnosis not present

## 2017-08-02 NOTE — Progress Notes (Signed)
Subjective:   Matthew Maldonado is a 67 y.o. male who presents for an Initial Medicare Annual Wellness Visit.   Review of Systems   Cardiac Risk Factors include: advanced age (>19men, >53 women);dyslipidemia;obesity (BMI >30kg/m2);smoking/ tobacco exposure;male gender    Objective:    Today's Vitals   08/02/17 1107  BP: 122/70  Pulse: 60  Resp: 16  Temp: 97.9 F (36.6 C)  TempSrc: Temporal  SpO2: 98%  Weight: 197 lb 9.6 oz (89.6 kg)  Height: 5\' 8"  (1.727 m)   Body mass index is 30.04 kg/m.  Advanced Directives 08/02/2017 11/26/2015  Does Patient Have a Medical Advance Directive? Yes No;Yes  Type of Paramedic of [place];Living will Living will  Copy of [place] in Chart? No - copy requested -    Current Medications (verified) Outpatient Encounter Medications as of 08/02/2017  Medication Sig  . aspirin EC 81 MG tablet Take 81 mg by mouth daily.  Marland Kitchen atorvastatin (LIPITOR) 80 MG tablet Take 1 tablet (80 mg total) by mouth daily at 6 PM.  . busPIRone (BUSPAR) 15 MG tablet Take 1 tablet (15 mg total) by mouth 2 (two) times daily.  Marland Kitchen co-enzyme Q-10 30 MG capsule Take 30 mg by mouth daily.   Marland Kitchen esomeprazole (NEXIUM) 20 MG capsule Take 1 capsule (20 mg total) by mouth daily at 12 noon.  . fluticasone (FLONASE) 50 MCG/ACT nasal spray Place 2 sprays into both nostrils daily.  . hydrOXYzine (VISTARIL) 25 MG capsule Take 1-3 capsules (25-75 mg total) by mouth at bedtime as needed (for svere anxiety sx and sleep).  Marland Kitchen ketoconazole (NIZORAL) 2 % shampoo   . loratadine (CLARITIN) 10 MG tablet Take by mouth.  . metoprolol succinate (TOPROL-XL) 25 MG 24 hr tablet Take 1 tablet (25 mg total) by mouth daily.  . Multiple Vitamins-Minerals (MULTIVITAMIN WITH MINERALS) tablet Take 1 tablet by mouth daily.  Marland Kitchen venlafaxine XR (EFFEXOR XR) 75 MG 24 hr capsule Take 1 capsule (75 mg total) by mouth daily with breakfast.  . atovaquone-proguanil (MALARONE)  250-100 MG TABS tablet Take 1 tablet by mouth daily. Start 2 days prior to [place] trip. Take daily on full stomach until complete (Patient not taking: Reported on 08/02/2017)  . mirabegron ER (MYRBETRIQ) 25 MG TB24 tablet Take 1 tablet (25 mg total) by mouth daily. (Patient not taking: Reported on 08/02/2017)   No facility-administered encounter medications on file as of 08/02/2017.     Allergies (verified) Patient has no known allergies.   History: Past Medical History:  Diagnosis Date  . Anxiety   . 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   Marland Kitchen BPH (benign prostatic hyperplasia)   . Depression   . Insomnia   . Minimal change disease   . Nocturia    History reviewed. No pertinent surgical history. Family History  Problem Relation Age of Onset  . Osteoporosis Mother   . Heart disease Father   . Heart disease Brother   . Alcohol abuse Brother   . Kidney disease Neg Hx   . Prostate cancer Neg Hx    Social History   Socioeconomic History  . Marital status: Married    Spouse name: quein  . Number of children: 0  . Years of education: Not on file  . 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 Food insecurity:    Worry: Never true    Inability: Never true  . Transportation needs:    Medical: No    Non-medical: No  Tobacco Use  . Smoking status: Current Every Day Smoker    Types: Cigars  . Smokeless tobacco: Never Used  . Tobacco comment: 1 or 2 a day   Substance and Sexual Activity  . Alcohol use: Yes    Alcohol/week: 10.8 oz    Types: 1 Cans of beer, 14 Shots of liquor, 3 Standard drinks or equivalent per week  . Drug use: No  . Sexual activity: Not Currently  Lifestyle  . Physical activity:    Days per week: 0 days    Minutes per session: 0 min  . Stress: Rather much  Relationships  . Social connections:     Talks on phone: Three times a week    Gets together: Never    Attends religious service: Never    Active member of club or organization: Yes    Attends meetings of clubs or organizations: More than 4 times per year    Relationship status: Married  Other Topics Concern  . Not on file  Social History Narrative  . Not on file   Tobacco Counseling Ready to quit: Yes Counseling given: No Comment: 1 or 2 a day    Clinical Intake:  Pre-visit preparation completed: Yes  Pain : No/denies pain     Nutritional Status: BMI 25 -29 Overweight Nutritional Risks: None Diabetes: No  How often do you need to have someone help you when you read instructions, pamphlets, or other written materials from your doctor or pharmacy?: 1 - Never What is the last grade level you completed in school?: college   Interpreter Needed?: No  Information entered by :: Tiffany Hill,LPN   Activities of Daily Living In your present state of health, do you have any difficulty performing the following activities: 08/02/2017 03/21/2017  Hearing? N N  Vision? N N  Difficulty concentrating or making decisions? N Y  Walking or climbing stairs? N N  Dressing or bathing? N N  Doing errands, shopping? N N  Preparing Food and eating ? N -  Using the Toilet? N -  In the past six months, have you accidently leaked urine? N -  Do you have problems with loss of bowel control? N -  Managing your Medications? N -  Managing your Finances? N -  Housekeeping or managing your Housekeeping? N -  Some recent data might be hidden     Immunizations and Health Maintenance Immunization History  Administered Date(s) Administered  . Hepatitis A, Adult 03/31/2017  . Hepatitis B, adult 04/06/2017  . Influenza-Unspecified 12/18/2014, 12/09/2016  . Pneumococcal Conjugate-13 08/05/2014  . Pneumococcal Polysaccharide-23 10/02/2014  . Td 08/05/2014  . Tdap 08/05/2014  . Typhoid Inactivated 03/31/2017   There are no preventive  care reminders to display for this patient.  Patient Care Team: Valerie Roys, DO as PCP - General (Family Medicine) Laneta Simmers as Physician Assistant (Urology) Jannet Mantis, MD (Dermatology)  Indicate any recent Medical Services you may have received from other than [facility] providers in the past year (date may be approximate).    Assessment:   This is a routine wellness examination for Naji.  Hearing/Vision screen No exam data present  Dietary issues and exercise activities discussed: Current Exercise Habits: The patient does not participate in regular exercise at present,  Exercise limited by: None identified  Goals    . Quit Smoking     Smoking cessation discussed      Depression Screen PHQ 2/9 Scores 08/02/2017 03/21/2017  PHQ - 2 Score 2 6  PHQ- 9 Score 6 18    Fall Risk Fall Risk  08/02/2017 03/21/2017  Falls in the past year? No No    Is the patient's home free of loose throw rugs in walkways, pet beds, electrical cords, etc?   yes      Grab bars in the bathroom? no      Handrails on the stairs?   yes      Adequate lighting?   yes  Timed Get Up and Go performed: Completed in 8 seconds with no use of assistive devices, steady gait. No intervention needed at this time.   Cognitive Function:     6CIT Screen 08/02/2017  What Year? 0 points  What month? 0 points  What time? 0 points  Count back from 20 0 points  Months in reverse 0 points  Repeat phrase 0 points  Total Score 0    Screening Tests Health Maintenance  Topic Date Due  . INFLUENZA VACCINE  10/12/2017  . PNA vac Low Risk Adult (2 of 2 - PPSV23) 10/02/2019  . COLONOSCOPY  03/14/2021  . TETANUS/TDAP  08/04/2024  . Hepatitis C Screening  Completed    Qualifies for Shingles Vaccine? Yes, discussed shingrix vaccine   Cancer Screenings: Lung: Low Dose CT Chest recommended if Age 97-80 years, 30 pack-year currently smoking OR have quit w/in 15years. Patient does not  qualify. Colorectal: completed 03/15/2011  Additional Screenings:  Hepatitis C Screening: completed 03/21/2017      Plan:    I have personally reviewed and addressed the Medicare Annual Wellness questionnaire and have noted the following in the patient's chart:  A. Medical and social history B. Use of alcohol, tobacco or illicit drugs  C. Current medications and supplements D. Functional ability and status E.  Nutritional status F.  Physical activity G. Advance directives H. List of other physicians I.  Hospitalizations, surgeries, and ER visits in previous 12 months J.  [employer] such as hearing and vision if needed, cognitive and depression L. Referrals and appointments   In addition, I have reviewed and discussed with patient certain preventive protocols, quality metrics, and best practice recommendations. A written personalized care plan for preventive services as well as general preventive health recommendations were provided to patient.   Signed,  Tyler Aas, LPN Nurse Health Advisor   Nurse Notes:none

## 2017-08-02 NOTE — Patient Instructions (Addendum)
[name] , Thank you for taking time to come for your Medicare Wellness Visit. I appreciate your ongoing commitment to your health goals. Please review the following plan we discussed and let me know if I can assist you in the future.   Screening recommendations/referrals: Colonoscopy: completed 01/16/2012  Recommended yearly ophthalmology/optometry visit for glaucoma screening and checkup Recommended yearly dental visit for hygiene and checkup  Vaccinations: Influenza vaccine: up to date  Pneumococcal vaccine: up to date Tdap vaccine: up to date  Shingles vaccine: shingrix eligible, check with your insurance company for coverage   Advanced directives: Please bring a copy of your health care power of attorney and living will to the office at your convenience.  Conditions/risks identified: Smoking cessation discussed, Matthew Estelle, RN will call you to discuss lung cancer screening.   Next appointment: Follow up in one year for your annual wellness exam.   Preventive Care 65 Years and Older, Male Preventive care refers to lifestyle choices and visits with your health care provider that can promote health and wellness. What does preventive care include?  A yearly physical exam. This is also called an annual well check.  Dental exams once or twice a year.  Routine eye exams. Ask your health care provider how often you should have your eyes checked.  Personal lifestyle choices, including:  Daily care of your teeth and gums.  Regular physical activity.  Eating a healthy diet.  Avoiding tobacco and drug use.  Limiting alcohol use.  Practicing safe sex.  Taking low doses of aspirin every day.  Taking vitamin and mineral supplements as recommended by your health care provider. What happens during an annual well check? The services and screenings done by your health care provider during your annual well check will depend on your age, overall health, lifestyle risk factors, and  family history of disease. Counseling  Your health care provider may ask you questions about your:  Alcohol use.  Tobacco use.  Drug use.  Emotional well-being.  Home and relationship well-being.  Sexual activity.  Eating habits.  History of falls.  Memory and ability to understand (cognition).  Work and work Statistician. Screening  You may have the following tests or measurements:  Height, weight, and BMI.  Blood pressure.  Lipid and cholesterol levels. These may be checked every 5 years, or more frequently if you are over 91 years old.  Skin check.  Lung cancer screening. You may have this screening every year starting at age 8 if you have a 30-pack-year history of smoking and currently smoke or have quit within the past 15 years.  Fecal occult blood test (FOBT) of the stool. You may have this test every year starting at age 54.  Flexible sigmoidoscopy or colonoscopy. You may have a sigmoidoscopy every 5 years or a colonoscopy every 10 years starting at age 51.  Prostate cancer screening. Recommendations will vary depending on your family history and other risks.  Hepatitis C blood test.  Hepatitis B blood test.  Sexually transmitted disease (STD) testing.  Diabetes screening. This is done by checking your blood sugar (glucose) after you have not eaten for a while (fasting). You may have this done every 1-3 years.  Abdominal aortic aneurysm (AAA) screening. You may need this if you are a current or former smoker.  Osteoporosis. You may be screened starting at age 92 if you are at high risk. Talk with your health care provider about your test results, treatment options, and if necessary,  the need for more tests. Vaccines  Your health care provider may recommend certain vaccines, such as:  Influenza vaccine. This is recommended every year.  Tetanus, diphtheria, and acellular pertussis (Tdap, Td) vaccine. You may need a Td booster every 10 years.  Zoster  vaccine. You may need this after age 85.  Pneumococcal 13-valent conjugate (PCV13) vaccine. One dose is recommended after age 48.  Pneumococcal polysaccharide (PPSV23) vaccine. One dose is recommended after age 83. Talk to your health care provider about which screenings and vaccines you need and how often you need them. This information is not intended to replace advice given to you by your health care provider. Make sure you discuss any questions you have with your health care provider. Document Released: 03/27/2015 Document Revised: 11/18/2015 Document Reviewed: 12/30/2014 Elsevier Interactive Patient Education  2017 [employer] Prevention in the Home Falls can cause injuries. They can happen to people of all ages. There are many things you can do to make your home safe and to help prevent falls. What can I do on the outside of my home?  Regularly fix the edges of walkways and driveways and fix any cracks.  Remove anything that might make you trip as you walk through a door, such as a raised step or threshold.  Trim any bushes or trees on the path to your home.  Use bright outdoor lighting.  Clear any walking paths of anything that might make someone trip, such as rocks or tools.  Regularly check to see if handrails are loose or broken. Make sure that both sides of any steps have handrails.  Any raised decks and porches should have guardrails on the edges.  Have any leaves, snow, or ice cleared regularly.  Use sand or salt on walking paths during winter.  Clean up any spills in your garage right away. This includes oil or grease spills. What can I do in the bathroom?  Use night lights.  Install grab bars by the toilet and in the tub and shower. Do not use towel bars as grab bars.  Use non-skid mats or decals in the tub or shower.  If you need to sit down in the shower, use a plastic, non-slip stool.  Keep the floor dry. Clean up any water that spills on the  floor as soon as it happens.  Remove soap buildup in the tub or shower regularly.  Attach bath mats securely with double-sided non-slip rug tape.  Do not have throw rugs and other things on the floor that can make you trip. What can I do in the bedroom?  Use night lights.  Make sure that you have a light by your bed that is easy to reach.  Do not use any sheets or blankets that are too big for your bed. They should not hang down onto the floor.  Have a firm chair that has side arms. You can use this for support while you get dressed.  Do not have throw rugs and other things on the floor that can make you trip. What can I do in the kitchen?  Clean up any spills right away.  Avoid walking on wet floors.  Keep items that you use a lot in easy-to-reach places.  If you need to reach something above you, use a strong step stool that has a grab bar.  Keep electrical cords out of the way.  Do not use floor polish or wax that makes floors slippery. If you must use  wax, use non-skid floor wax.  Do not have throw rugs and other things on the floor that can make you trip. What can I do with my stairs?  Do not leave any items on the stairs.  Make sure that there are handrails on both sides of the stairs and use them. Fix handrails that are broken or loose. Make sure that handrails are as long as the stairways.  Check any carpeting to make sure that it is firmly attached to the stairs. Fix any carpet that is loose or worn.  Avoid having throw rugs at the top or bottom of the stairs. If you do have throw rugs, attach them to the floor with carpet tape.  Make sure that you have a light switch at the top of the stairs and the bottom of the stairs. If you do not have them, ask someone to add them for you. What else can I do to help prevent falls?  Wear shoes that:  Do not have high heels.  Have rubber bottoms.  Are comfortable and fit you well.  Are closed at the toe. Do not wear  sandals.  If you use a stepladder:  Make sure that it is fully opened. Do not climb a closed stepladder.  Make sure that both sides of the stepladder are locked into place.  Ask someone to hold it for you, if possible.  Clearly mark and make sure that you can see:  Any grab bars or handrails.  First and last steps.  Where the edge of each step is.  Use tools that help you move around (mobility aids) if they are needed. These include:  Canes.  Walkers.  Scooters.  Crutches.  Turn on the lights when you go into a dark area. Replace any light bulbs as soon as they burn out.  Set up your furniture so you have a clear path. Avoid moving your furniture around.  If any of your floors are uneven, fix them.  If there are any pets around you, be aware of where they are.  Review your medicines with your doctor. Some medicines can make you feel dizzy. This can increase your chance of falling. Ask your doctor what other things that you can do to help prevent falls. This information is not intended to replace advice given to you by your health care provider. Make sure you discuss any questions you have with your health care provider. Document Released: 12/25/2008 Document Revised: 08/06/2015 Document Reviewed: 04/04/2014 Elsevier Interactive Patient Education  2017 [employer].

## 2017-08-03 ENCOUNTER — Telehealth: Payer: Self-pay | Admitting: *Deleted

## 2017-08-03 NOTE — Telephone Encounter (Signed)
Received referral for initial lung cancer screening scan. Contacted patient and obtained smoking history, which includes only smoking cigars. Patient understands that he is not eligible for lung screening scan.

## 2017-09-06 ENCOUNTER — Encounter: Payer: Self-pay | Admitting: Family Medicine

## 2017-09-06 ENCOUNTER — Ambulatory Visit (INDEPENDENT_AMBULATORY_CARE_PROVIDER_SITE_OTHER): Payer: Medicare Other | Admitting: Family Medicine

## 2017-09-06 VITALS — BP 112/78 | HR 59 | Temp 97.6°F | Wt 197.6 lb

## 2017-09-06 DIAGNOSIS — F419 Anxiety disorder, unspecified: Secondary | ICD-10-CM

## 2017-09-06 DIAGNOSIS — F331 Major depressive disorder, recurrent, moderate: Secondary | ICD-10-CM | POA: Diagnosis not present

## 2017-09-06 DIAGNOSIS — W57XXXA Bitten or stung by nonvenomous insect and other nonvenomous arthropods, initial encounter: Secondary | ICD-10-CM | POA: Diagnosis not present

## 2017-09-06 DIAGNOSIS — F411 Generalized anxiety disorder: Secondary | ICD-10-CM

## 2017-09-06 DIAGNOSIS — I471 Supraventricular tachycardia: Secondary | ICD-10-CM

## 2017-09-06 DIAGNOSIS — E782 Mixed hyperlipidemia: Secondary | ICD-10-CM

## 2017-09-06 DIAGNOSIS — F338 Other recurrent depressive disorders: Secondary | ICD-10-CM | POA: Diagnosis not present

## 2017-09-06 MED ORDER — TRIAMCINOLONE ACETONIDE 0.5 % EX OINT: 1.0000 "application " | TOPICAL_OINTMENT | Freq: Two times a day (BID) | CUTANEOUS | 2 refills | Status: DC

## 2017-09-06 MED ORDER — VENLAFAXINE HCL ER 75 MG PO CP24: 75.0000 mg | ORAL_CAPSULE | Freq: Every day | ORAL | 1 refills | Status: DC

## 2017-09-06 MED ORDER — ATORVASTATIN CALCIUM 80 MG PO TABS: 80.0000 mg | ORAL_TABLET | Freq: Every day | ORAL | 1 refills | Status: DC

## 2017-09-06 MED ORDER — METOPROLOL SUCCINATE ER 25 MG PO TB24: 25.0000 mg | ORAL_TABLET | Freq: Every day | ORAL | 1 refills | Status: DC

## 2017-09-06 MED ORDER — BUSPIRONE HCL 15 MG PO TABS
15.0000 mg | ORAL_TABLET | Freq: Two times a day (BID) | ORAL | 1 refills | Status: DC
Start: 2017-09-06 — End: 2018-04-16

## 2017-09-06 MED ORDER — ESOMEPRAZOLE MAGNESIUM 20 MG PO CPDR: 20.0000 mg | DELAYED_RELEASE_CAPSULE | Freq: Every day | ORAL | 1 refills | Status: DC

## 2017-09-06 MED ORDER — HYDROXYZINE PAMOATE 25 MG PO CAPS: 25.0000 mg | ORAL_CAPSULE | Freq: Every evening | ORAL | 1 refills | Status: DC | PRN

## 2017-09-06 NOTE — Progress Notes (Signed)
BP 112/78 (BP Location: Left Arm, Patient Position: Sitting, Cuff Size: Large)   Pulse (!) 59   Temp 97.6 F (36.4 C)   Wt 197 lb 9 oz (89.6 kg)   SpO2 95%   BMI 30.04 kg/m    Subjective:    Patient ID: Matthew Maldonado, male    DOB: [birth year], 67 y.o.   MRN: [id number]  HPI: Matthew Maldonado is a 67 y.o. male  Chief Complaint  Patient presents with  . BUG BITES   HYPERLIPIDEMIA Hyperlipidemia status: excellent compliance Satisfied with current treatment?  yes Side effects:  no Medication compliance: excellent compliance Past cholesterol meds: atorvastatin Supplements: none Aspirin:  yes The 10-year ASCVD risk score Mikey Bussing DC Jr., et al., 2013) is: 10.7%   Values used to calculate the score:     Age: 76 years     Sex: Male     Is Non-Hispanic African American: No     Diabetic: No     Tobacco smoker: Yes     Systolic Blood Pressure: 283 mmHg     Is BP treated: No     HDL Cholesterol: 56 mg/dL     Total Cholesterol: 133 mg/dL Chest pain:  no  DEPRESSION- doing much better on the effexor, feeling much better Mood status: better Satisfied with current treatment?: yes Symptom severity: moderate  Duration of current treatment : chronic Side effects: no Medication compliance: excellent compliance Psychotherapy/counseling: no  Depressed mood: yes Anxious mood: yes Anhedonia: no Significant weight loss or gain: no Insomnia: no  Fatigue: yes Feelings of worthlessness or guilt: no Impaired concentration/indecisiveness: no Suicidal ideations: no Hopelessness: no Crying spells: no Depression screen [facility] 2/9 09/06/2017 08/02/2017 03/21/2017  Decreased Interest 0 1 3  Down, Depressed, Hopeless 0 1 3  PHQ - 2 Score 0 2 6  Altered sleeping 1 0 3  Tired, decreased energy 1 1 3   Change in appetite 1 1 0  Feeling bad or failure about yourself  0 0 2  Trouble concentrating 1 2 3   Moving slowly or fidgety/restless 0 0 1  Suicidal thoughts 0 0 0  PHQ-9 Score 4 6 18   Difficult  doing work/chores Not difficult at all Not difficult at all -   GAD 7 : Generalized Anxiety Score 09/06/2017  Nervous, Anxious, on Edge 1  Control/stop worrying 1  Worry too much - different things 1  Trouble relaxing 1  Restless 0  Easily annoyed or irritable 1  Afraid - awful might happen 0  Total GAD 7 Score 5  Anxiety Difficulty Not difficult at all     Relevant past medical, surgical, family and social history reviewed and updated as indicated. Interim medical history since our last visit reviewed. Allergies and medications reviewed and updated.  Review of Systems  Constitutional: Negative.   Respiratory: Negative.   Cardiovascular: Negative.   Gastrointestinal: Negative.   Musculoskeletal: Negative.   Neurological: Negative.   Psychiatric/Behavioral: Negative.     Per HPI unless specifically indicated above     Objective:    BP 112/78 (BP Location: Left Arm, Patient Position: Sitting, Cuff Size: Large)   Pulse (!) 59   Temp 97.6 F (36.4 C)   Wt 197 lb 9 oz (89.6 kg)   SpO2 95%   BMI 30.04 kg/m   Wt Readings from Last 3 Encounters:  09/06/17 197 lb 9 oz (89.6 kg)  08/02/17 197 lb 9.6 oz (89.6 kg)  05/04/17 197 lb 5 oz (89.5 kg)  Physical Exam  Constitutional: He is oriented to person, place, and time. He appears well-developed and well-nourished. No distress.  HENT:  Head: Normocephalic and atraumatic.  Right Ear: Hearing normal.  Left Ear: Hearing normal.  Nose: Nose normal.  Eyes: Conjunctivae and lids are normal. Right eye exhibits no discharge. Left eye exhibits no discharge. No scleral icterus.  Cardiovascular: Normal rate, regular rhythm, normal heart sounds and intact distal pulses. Exam reveals no gallop and no friction rub.  No murmur heard. Pulmonary/Chest: Effort normal and breath sounds normal. No stridor. No respiratory distress. He has no wheezes. He has no rales. He exhibits no tenderness.  Musculoskeletal: Normal range of motion.    Neurological: He is alert and oriented to person, place, and time.  Skin: Skin is warm, dry and intact. Capillary refill takes less than 2 seconds. No rash noted. He is not diaphoretic. No erythema. No pallor.  Bug bites on legs  Psychiatric: He has a normal mood and affect. His speech is normal and behavior is normal. Judgment and thought content normal. Cognition and memory are normal.  Nursing note and vitals reviewed.   Results for orders placed or performed in visit on 05/01/17  Bladder Scan (Post Void Residual) in office  Result Value Ref Range   Scan Result 30ML       Assessment & Plan:   Problem List Items Addressed This Visit      Cardiovascular and Mediastinum   Paroxysmal supraventricular tachycardia ([place])    Under good control today. Continue current regimen. Refills given. Continue to monitor.       Relevant Medications   metoprolol succinate (TOPROL-XL) 25 MG 24 hr tablet   atorvastatin (LIPITOR) 80 MG tablet     Other   Hyperlipidemia - Primary    Under good control today. Continue current regimen. Refills given. Continue to monitor.       Relevant Medications   metoprolol succinate (TOPROL-XL) 25 MG 24 hr tablet   atorvastatin (LIPITOR) 80 MG tablet   Other Relevant Orders   Comprehensive metabolic panel   Lipid Panel w/o Chol/HDL Ratio   Seasonal affective disorder (HCC)    Under good control today. Continue current regimen. Refills given. Continue to monitor.       Relevant Medications   venlafaxine XR (EFFEXOR XR) 75 MG 24 hr capsule   hydrOXYzine (VISTARIL) 25 MG capsule   busPIRone (BUSPAR) 15 MG tablet   Depression    Under good control today. Continue current regimen. Refills given. Continue to monitor.       Relevant Medications   venlafaxine XR (EFFEXOR XR) 75 MG 24 hr capsule   hydrOXYzine (VISTARIL) 25 MG capsule   busPIRone (BUSPAR) 15 MG tablet   Anxiety    Under good control today. Continue current regimen. Refills given. Continue  to monitor.       Relevant Medications   venlafaxine XR (EFFEXOR XR) 75 MG 24 hr capsule   hydrOXYzine (VISTARIL) 25 MG capsule   busPIRone (BUSPAR) 15 MG tablet    Other Visit Diagnoses    Bug bite, initial encounter       Will treat with triamcinalone. Rx given today.   GAD (generalized anxiety disorder)       Relevant Medications   venlafaxine XR (EFFEXOR XR) 75 MG 24 hr capsule   hydrOXYzine (VISTARIL) 25 MG capsule   busPIRone (BUSPAR) 15 MG tablet       Follow up plan: Return in about 6 months (around 03/08/2018) for  Physical.

## 2017-09-06 NOTE — Assessment & Plan Note (Signed)
Under good control today. Continue current regimen. Refills given. Continue to monitor.

## 2017-09-07 LAB — COMPREHENSIVE METABOLIC PANEL
A/G RATIO: 2.5 — AB (ref 1.2–2.2)
ALT: 40 IU/L (ref 0–44)
AST: 41 IU/L — ABNORMAL HIGH (ref 0–40)
Albumin: 4.9 g/dL — ABNORMAL HIGH (ref 3.6–4.8)
Alkaline Phosphatase: 74 IU/L (ref 39–117)
BUN / CREAT RATIO: 26 — AB (ref 10–24)
BUN: 23 mg/dL (ref 8–27)
Bilirubin Total: 0.7 mg/dL (ref 0.0–1.2)
CALCIUM: 9.5 mg/dL (ref 8.6–10.2)
CO2: 20 mmol/L (ref 20–29)
Chloride: 105 mmol/L (ref 96–106)
Creatinine, Ser: 0.87 mg/dL (ref 0.76–1.27)
GFR, EST AFRICAN AMERICAN: 104 mL/min/{1.73_m2} (ref >59)
GFR, EST NON AFRICAN AMERICAN: 90 mL/min/{1.73_m2} (ref >59)
GLOBULIN, TOTAL: 2 g/dL (ref 1.5–4.5)
Glucose: 91 mg/dL (ref 65–99)
POTASSIUM: 4.4 mmol/L (ref 3.5–5.2)
SODIUM: 142 mmol/L (ref 134–144)
TOTAL PROTEIN: 6.9 g/dL (ref 6.0–8.5)

## 2017-09-07 LAB — LIPID PANEL W/O CHOL/HDL RATIO
Cholesterol, Total: 167 mg/dL (ref 100–199)
HDL: 52 mg/dL (ref >39)
LDL Calculated: 97 mg/dL (ref 0–99)
Triglycerides: 91 mg/dL (ref 0–149)
VLDL CHOLESTEROL CAL: 18 mg/dL (ref 5–40)

## 2017-10-16 ENCOUNTER — Ambulatory Visit
Admission: RE | Admit: 2017-10-16 | Discharge: 2017-10-16 | Disposition: A | Discharge status: Home / Self Care | Payer: Medicare Other | Source: Ambulatory Visit | Attending: Family Medicine | Admitting: Family Medicine

## 2017-10-16 ENCOUNTER — Ambulatory Visit (INDEPENDENT_AMBULATORY_CARE_PROVIDER_SITE_OTHER): Payer: Medicare Other | Admitting: Family Medicine

## 2017-10-16 ENCOUNTER — Encounter: Payer: Self-pay | Admitting: Family Medicine

## 2017-10-16 ENCOUNTER — Telehealth: Payer: Self-pay | Admitting: Family Medicine

## 2017-10-16 VITALS — BP 125/89 | HR 68 | Temp 98.0°F | Ht 68.0 in | Wt 194.6 lb

## 2017-10-16 DIAGNOSIS — M5442 Lumbago with sciatica, left side: Principal | ICD-10-CM

## 2017-10-16 DIAGNOSIS — M5441 Lumbago with sciatica, right side: Secondary | ICD-10-CM

## 2017-10-16 DIAGNOSIS — M47896 Other spondylosis, lumbar region: Secondary | ICD-10-CM | POA: Insufficient documentation

## 2017-10-16 DIAGNOSIS — M545 Low back pain: Secondary | ICD-10-CM | POA: Diagnosis not present

## 2017-10-16 MED ORDER — CYCLOBENZAPRINE HCL 5 MG PO TABS: 5.0000 mg | ORAL_TABLET | Freq: Three times a day (TID) | ORAL | 1 refills | Status: DC | PRN

## 2017-10-16 MED ORDER — TRAMADOL HCL 50 MG PO TABS: 50.0000 mg | ORAL_TABLET | Freq: Three times a day (TID) | ORAL | 0 refills | Status: DC | PRN

## 2017-10-16 MED ORDER — PREDNISONE 10 MG PO TABS: ORAL_TABLET | ORAL | 0 refills | Status: DC

## 2017-10-16 MED ORDER — GABAPENTIN 100 MG PO CAPS
100.0000 mg | ORAL_CAPSULE | Freq: Three times a day (TID) | ORAL | 1 refills | Status: DC
Start: 2017-10-16 — End: 2017-10-24

## 2017-10-16 NOTE — Patient Instructions (Addendum)
Follow up as needed  Piriformis Syndrome Rehab Ask your health care provider which exercises are safe for you. Do exercises exactly as told by your health care provider and adjust them as directed. It is normal to feel mild stretching, pulling, tightness, or discomfort as you do these exercises, but you should stop right away if you feel sudden pain or your pain gets worse.Do not begin these exercises until told by your health care provider. Stretching and range of motion exercises These exercises warm up your muscles and joints and improve the movement and flexibility of your hip and pelvis. These exercises also help to relieve pain, numbness, and tingling. Exercise A: Hip rotators  1. Lie on your back on a firm surface. 2. Pull your left / right knee toward your same shoulder with your left / right hand until your knee is pointing toward the ceiling. Hold your left / right ankle with your other hand. 3. Keeping your knee steady, gently pull your left / right ankle toward your other shoulder until you feel a stretch in your buttocks. 4. Hold this position for __________ seconds. Repeat __________ times. Complete this stretch __________ times a day. Exercise B: Hip extensors 1. Lie on your back on a firm surface. Both of your legs should be straight. 2. Pull your left / right knee to your chest. Hold your leg in this position by holding onto the back of your thigh or the front of your knee. 3. Hold this position for __________ seconds. 4. Slowly return to the starting position. Repeat __________ times. Complete this stretch __________ times a day. Strengthening exercises These exercises build strength and endurance in your hip and thigh muscles. Endurance is the ability to use your muscles for a long time, even after they get tired. Exercise C: Straight leg raises ( hip abductors) 1. Lie on your side with your left / right leg in the top position. Lie so your head, shoulder, knee, and hip  line up. Bend your bottom knee to help you balance. 2. Lift your top leg up 4-6 inches (10-15 cm), keeping your toes pointed straight ahead. 3. Hold this position for __________ seconds. 4. Slowly lower your leg to the starting position. Let your muscles relax completely. Repeat __________ times. Complete this exercise__________ times a day. Exercise D: Hip abductors and rotators, quadruped  1. Get on your hands and knees on a firm, lightly padded surface. Your hands should be directly below your shoulders, and your knees should be directly below your hips. 2. Lift your left / right knee out to the side. Keep your knee bent. Do not twist your body. 3. Hold this position for __________ seconds. 4. Slowly lower your leg. Repeat __________ times. Complete this exercise__________ times a day. Exercise E: Straight leg raises ( hip extensors) 1. Lie on your abdomen on a bed or a firm surface with a pillow under your hips. 2. Squeeze your buttock muscles and lift your left / right thigh off the bed. Do not let your back arch. 3. Hold this position for __________ seconds. 4. Slowly return to the starting position. Let your muscles relax completely before doing another repetition. Repeat __________ times. Complete this exercise__________ times a day. This information is not intended to replace advice given to you by your health care provider. Make sure you discuss any questions you have with your health care provider. Document Released: 02/28/2005 Document Revised: 11/03/2015 Document Reviewed: 02/10/2015 Elsevier Interactive Patient Education  [employer].

## 2017-10-16 NOTE — Telephone Encounter (Signed)
Spoke with patient, he is upset that he now has to go back to get another set of xrays done,  when the hip pain was discussed with the back pain at his visit.He states that there is no reason that these orders were not placed when the back xray orders were placed.  He states that this is going to but him further behind, and that he will go tomorrow to have them done.  He would like the results of the back xrays as soon as possible.

## 2017-10-16 NOTE — Progress Notes (Signed)
BP 125/89   Pulse 68   Temp 98 F (36.7 C) (Oral)   Ht 5\' 8"  (1.727 m)   Wt 194 lb 9.6 oz (88.3 kg)   SpO2 97%   BMI 29.59 kg/m    Subjective:    Patient ID: Matthew Maldonado, male    DOB: 11-25-1950, 67 y.o.   MRN: [id number]  HPI: Matthew Maldonado is a 67 y.o. male  Chief Complaint  Patient presents with  . Hip Pain    pt states he has been having bilateral hip and lower back pain for a month, states the pain runs down both legs to his knee caps    Low back pain radiating down backs of both legs. Hx of sciatica of the left side intermittently for years. Most recently was changing a tire on his truck about a month ago and felt a twinge in left low back muscles. Now having persistent low back soreness and hip soreness down legs and groin. No numbness or tingling, leg weakness, incontinence issues. Currently using OTC aleve sparingly with minimal relief, also trying voltaren gel with good relief.   Relevant past medical, surgical, family and social history reviewed and updated as indicated. Interim medical history since our last visit reviewed. Allergies and medications reviewed and updated.  Review of Systems  Per HPI unless specifically indicated above     Objective:    BP 125/89   Pulse 68   Temp 98 F (36.7 C) (Oral)   Ht 5\' 8"  (1.727 m)   Wt 194 lb 9.6 oz (88.3 kg)   SpO2 97%   BMI 29.59 kg/m   Wt Readings from Last 3 Encounters:  10/16/17 194 lb 9.6 oz (88.3 kg)  09/06/17 197 lb 9 oz (89.6 kg)  08/02/17 197 lb 9.6 oz (89.6 kg)    Physical Exam  Constitutional: He is oriented to person, place, and time. He appears well-developed and well-nourished. No distress.  HENT:  Head: Atraumatic.  Eyes: Conjunctivae and EOM are normal.  Neck: Normal range of motion. Neck supple.  Cardiovascular: Normal rate, regular rhythm and intact distal pulses.  Pulmonary/Chest: Effort normal and breath sounds normal.  Musculoskeletal: Normal range of motion. He exhibits tenderness  (TTP b/l low back into lateral hips). He exhibits no deformity.  - SLR  Neurological: He is alert and oriented to person, place, and time. No cranial nerve deficit or sensory deficit. He exhibits normal muscle tone.  Skin: Skin is warm and dry. No erythema.  Psychiatric: He has a normal mood and affect. His behavior is normal.  Nursing note and vitals reviewed.   Results for orders placed or performed in visit on 09/06/17  Comprehensive metabolic panel  Result Value Ref Range   Glucose 91 65 - 99 mg/dL   BUN 23 8 - 27 mg/dL   Creatinine, Ser 0.87 0.76 - 1.27 mg/dL   GFR calc non Af Amer 90 >59 mL/min/1.73   GFR calc Af Amer 104 >59 mL/min/1.73   BUN/Creatinine Ratio 26 (H) 10 - 24   Sodium 142 134 - 144 mmol/L   Potassium 4.4 3.5 - 5.2 mmol/L   Chloride 105 96 - 106 mmol/L   CO2 20 20 - 29 mmol/L   Calcium 9.5 8.6 - 10.2 mg/dL   Total Protein 6.9 6.0 - 8.5 g/dL   Albumin 4.9 (H) 3.6 - 4.8 g/dL   Globulin, Total 2.0 1.5 - 4.5 g/dL   Albumin/Globulin Ratio 2.5 (H) 1.2 - 2.2  Bilirubin Total 0.7 0.0 - 1.2 mg/dL   Alkaline Phosphatase 74 39 - 117 IU/L   AST 41 (H) 0 - 40 IU/L   ALT 40 0 - 44 IU/L  Lipid Panel w/o Chol/HDL Ratio  Result Value Ref Range   Cholesterol, Total 167 100 - 199 mg/dL   Triglycerides 91 0 - 149 mg/dL   HDL 52 >39 mg/dL   VLDL Cholesterol Cal 18 5 - 40 mg/dL   LDL Calculated 97 0 - 99 mg/dL      Assessment & Plan:   Problem List Items Addressed This Visit    None    Visit Diagnoses    Acute bilateral low back pain with bilateral sciatica    -  Primary   Lumbar x-ray, start prednisone taper, flexeril prn, and short course of tramadol for severe pain. Sedation precautions, stretches, supportive care reviewed   Relevant Medications   predniSONE (DELTASONE) 10 MG tablet   cyclobenzaprine (FLEXERIL) 5 MG tablet   traMADol (ULTRAM) 50 MG tablet   Other Relevant Orders   DG Lumbar Spine Complete (Completed)       Follow up plan: Return if symptoms  worsen or fail to improve.

## 2017-10-16 NOTE — Telephone Encounter (Signed)
Orders placed.

## 2017-10-16 NOTE — Telephone Encounter (Signed)
Copied from [place] [id number]. Topic: Quick Communication - See Telephone Encounter >> Oct 16, 2017 11:50 AM Nils Flack wrote: CRM for notification. See Telephone encounter for: 10/16/17. Pt called - he understood that he would get xrays for his hip as well as his back. When he got to the imaging center, there are only orders for his back  and he would like orders place for his hip.  Cb is [phone]

## 2017-10-17 ENCOUNTER — Ambulatory Visit
Admission: RE | Admit: 2017-10-17 | Discharge: 2017-10-17 | Disposition: A | Discharge status: Home / Self Care | Payer: Medicare Other | Source: Ambulatory Visit | Attending: Family Medicine | Admitting: Family Medicine

## 2017-10-17 DIAGNOSIS — M1612 Unilateral primary osteoarthritis, left hip: Secondary | ICD-10-CM | POA: Diagnosis not present

## 2017-10-17 DIAGNOSIS — M5441 Lumbago with sciatica, right side: Secondary | ICD-10-CM | POA: Insufficient documentation

## 2017-10-17 DIAGNOSIS — M5442 Lumbago with sciatica, left side: Secondary | ICD-10-CM | POA: Insufficient documentation

## 2017-10-17 DIAGNOSIS — M160 Bilateral primary osteoarthritis of hip: Secondary | ICD-10-CM | POA: Diagnosis not present

## 2017-10-18 ENCOUNTER — Telehealth: Payer: Self-pay | Admitting: Family Medicine

## 2017-10-18 ENCOUNTER — Ambulatory Visit
Admission: RE | Admit: 2017-10-18 | Discharge: 2017-10-18 | Disposition: A | Discharge status: Home / Self Care | Payer: Medicare Other | Source: Ambulatory Visit | Attending: Family Medicine | Admitting: Family Medicine

## 2017-10-18 DIAGNOSIS — R937 Abnormal findings on diagnostic imaging of other parts of musculoskeletal system: Secondary | ICD-10-CM

## 2017-10-18 DIAGNOSIS — M160 Bilateral primary osteoarthritis of hip: Secondary | ICD-10-CM | POA: Diagnosis not present

## 2017-10-18 MED ORDER — HYDROCODONE-ACETAMINOPHEN 5-325 MG PO TABS: 1.0000 | ORAL_TABLET | Freq: Four times a day (QID) | ORAL | 0 refills | Status: DC | PRN

## 2017-10-18 NOTE — Telephone Encounter (Signed)
Please let him know his hip and low back x-rays showed mild arthritis, and the right hip x-ray showed a small place on his right femur that the Radiologist is requesting further imaging for. I have ordered this femur x-ray for him to get whenever he is able.

## 2017-10-18 NOTE — Telephone Encounter (Signed)
Hydrocodone sent, pt to discard the tramadol - do not take both together

## 2017-10-18 NOTE — Telephone Encounter (Signed)
Copied from [place] [phone]. Topic: Quick Communication - Rx Refill/Question >> Oct 18, 2017  2:15 PM Margot Ables wrote: Medication: tramadol is not helping with pts hip pain - he is asking for something stronger to be called into the pharmacy - he has taken 1 pill 3x day & 2 pills 3x day and it isn't cutting it Preferred Pharmacy (with phone number or street name): [facility], [place] - [place]. [phone] (Phone) [phone] (Fax)

## 2017-10-18 NOTE — Telephone Encounter (Signed)
Message relayed to patient. Verbalized understanding and denied questions.   

## 2017-10-18 NOTE — Telephone Encounter (Signed)
Called and notified patient of x-ray results per Apolonio Schneiders. Patient verbalized understanding.

## 2017-10-19 ENCOUNTER — Telehealth: Payer: Self-pay | Admitting: Family Medicine

## 2017-10-19 ENCOUNTER — Other Ambulatory Visit: Payer: Self-pay | Admitting: Family Medicine

## 2017-10-19 DIAGNOSIS — M899 Disorder of bone, unspecified: Secondary | ICD-10-CM

## 2017-10-19 NOTE — Telephone Encounter (Signed)
Left message on machine for pt to return call to the office.  

## 2017-10-19 NOTE — Telephone Encounter (Signed)
Left message on machine for pt to return call to the office. CRM created.  

## 2017-10-19 NOTE — Telephone Encounter (Signed)
Patient notified

## 2017-10-19 NOTE — Telephone Encounter (Signed)
Please call and let him know that I'm referring him to orthopedics for an indeterminate lesion on his femur for further evaluation. Reassure him that it very well could be nothing significant, but just need to do some more imaging/testing

## 2017-10-19 NOTE — Telephone Encounter (Signed)
Pt calling back for results

## 2017-10-20 DIAGNOSIS — M79651 Pain in right thigh: Secondary | ICD-10-CM | POA: Diagnosis not present

## 2017-10-20 DIAGNOSIS — M160 Bilateral primary osteoarthritis of hip: Secondary | ICD-10-CM | POA: Diagnosis not present

## 2017-10-20 DIAGNOSIS — M545 Low back pain: Secondary | ICD-10-CM | POA: Diagnosis not present

## 2017-10-23 ENCOUNTER — Telehealth: Payer: Self-pay | Admitting: Family Medicine

## 2017-10-23 ENCOUNTER — Other Ambulatory Visit: Payer: Self-pay | Admitting: Orthopedic Surgery

## 2017-10-23 DIAGNOSIS — M79651 Pain in right thigh: Secondary | ICD-10-CM

## 2017-10-23 NOTE — Telephone Encounter (Signed)
Please get patient scheduled.  °

## 2017-10-23 NOTE — Telephone Encounter (Signed)
That is a controlled substance. Will need an appointment to discuss dose changes.

## 2017-10-23 NOTE — Telephone Encounter (Signed)
LVM for pt to call back and schedule an appt.

## 2017-10-23 NOTE — Telephone Encounter (Signed)
Copied from [place] [phone]. Topic: General - Other >> Oct 23, 2017  1:29 PM Yvette Rack wrote: Reason for CRM: pt calling stating that the HYDROcodone-acetaminophen (NORCO/VICODIN) 5-325 MG tablet doesn't work for his pain and would like another Rx or dosage change it still hurts to walk up and down steps he cant bend over to put socks on

## 2017-10-24 ENCOUNTER — Encounter: Payer: Self-pay | Admitting: Physician Assistant

## 2017-10-24 ENCOUNTER — Ambulatory Visit (INDEPENDENT_AMBULATORY_CARE_PROVIDER_SITE_OTHER): Payer: Medicare Other | Admitting: Physician Assistant

## 2017-10-24 ENCOUNTER — Other Ambulatory Visit: Payer: Self-pay

## 2017-10-24 VITALS — BP 138/89 | HR 63 | Temp 97.2°F | Ht 68.5 in | Wt 197.3 lb

## 2017-10-24 DIAGNOSIS — M25551 Pain in right hip: Secondary | ICD-10-CM | POA: Diagnosis not present

## 2017-10-24 DIAGNOSIS — M25552 Pain in left hip: Secondary | ICD-10-CM | POA: Diagnosis not present

## 2017-10-24 DIAGNOSIS — M25651 Stiffness of right hip, not elsewhere classified: Secondary | ICD-10-CM | POA: Diagnosis not present

## 2017-10-24 MED ORDER — GABAPENTIN 300 MG PO CAPS: ORAL_CAPSULE | ORAL | 0 refills | Status: DC

## 2017-10-24 NOTE — Patient Instructions (Signed)

## 2017-10-24 NOTE — Progress Notes (Signed)
Subjective:    Patient ID: Matthew Maldonado, male    DOB: 1950/12/05, 67 y.o.   MRN: [id number]  Matthew Maldonado is a 67 y.o. male presenting on 10/24/2017 for Hip Pain; Back Pain; and Medication Refill   HPI   Trying to lift a spare tire and felt something in low back which did not affect him at the time. Then one week later he says he felt pain in his low back and all the way down to his knee, originally in his left leg but is now affecting right hip, buttock area, and down into right leg. Denies weakness in lower extremity, incontinence.   He was seen for this last week in clinic and obtained plain films for this of his lumbar spine as well as bilateral hips. Films showed some degenerative disc changes and incidental finding of sclerotic lesion on right femur.  He did see [facility] on 10/20/2017 and was seen by the physician assistant who is ordering a CT scan to follow up on a sclerotic lesion of his femur. Scheduled for physical therapy at [facility], which he is to stat today.   He has completed course of prednisone, which was helpful. He is taking flexeril PRN. He is also taking gabapentin 100 mg TID. Hydrocodone was not very helpful in terms of pain.   Social History   Tobacco Use  . Smoking status: Current Every Day Smoker    Types: Cigars  . Smokeless tobacco: Never Used  . Tobacco comment: 1 or 2 a day   Substance Use Topics  . Alcohol use: Yes    Alcohol/week: 18.0 standard drinks    Types: 1 Cans of beer, 14 Shots of liquor, 3 Standard drinks or equivalent per week  . Drug use: No    Review of Systems Per HPI unless specifically indicated above     Objective:    BP 138/89   Pulse 63   Temp (!) 97.2 F (36.2 C) (Tympanic)   Ht 5' 8.5" (1.74 m)   Wt 197 lb 4.8 oz (89.5 kg)   SpO2 98%   BMI 29.56 kg/m   Wt Readings from Last 3 Encounters:  10/24/17 197 lb 4.8 oz (89.5 kg)  10/16/17 194 lb 9.6 oz (88.3 kg)  09/06/17 197 lb 9 oz (89.6 kg)    Physical Exam    Constitutional: He is oriented to person, place, and time.  Musculoskeletal: Normal range of motion. He exhibits no edema or deformity.  Strength bilateral lower extremities 5/5. Pain upon palpation of piriformis in right buttock.   Neurological: He is alert and oriented to person, place, and time.  Reflex Scores:      Patellar reflexes are 2+ on the right side and 2+ on the left side.      Achilles reflexes are 2+ on the right side and 2+ on the left side. Skin: Skin is warm and dry.  Psychiatric: He has a normal mood and affect. His behavior is normal.   Results for orders placed or performed in visit on 09/06/17  Comprehensive metabolic panel  Result Value Ref Range   Glucose 91 65 - 99 mg/dL   BUN 23 8 - 27 mg/dL   Creatinine, Ser 0.87 0.76 - 1.27 mg/dL   GFR calc non Af Amer 90 >59 mL/min/1.73   GFR calc Af Amer 104 >59 mL/min/1.73   BUN/Creatinine Ratio 26 (H) 10 - 24   Sodium 142 134 - 144 mmol/L   Potassium  4.4 3.5 - 5.2 mmol/L   Chloride 105 96 - 106 mmol/L   CO2 20 20 - 29 mmol/L   Calcium 9.5 8.6 - 10.2 mg/dL   Total Protein 6.9 6.0 - 8.5 g/dL   Albumin 4.9 (H) 3.6 - 4.8 g/dL   Globulin, Total 2.0 1.5 - 4.5 g/dL   Albumin/Globulin Ratio 2.5 (H) 1.2 - 2.2   Bilirubin Total 0.7 0.0 - 1.2 mg/dL   Alkaline Phosphatase 74 39 - 117 IU/L   AST 41 (H) 0 - 40 IU/L   ALT 40 0 - 44 IU/L  Lipid Panel w/o Chol/HDL Ratio  Result Value Ref Range   Cholesterol, Total 167 100 - 199 mg/dL   Triglycerides 91 0 - 149 mg/dL   HDL 52 >39 mg/dL   VLDL Cholesterol Cal 18 5 - 40 mg/dL   LDL Calculated 97 0 - 99 mg/dL      Assessment & Plan:  1. Pain of both hip joints  Sounds like muscle spasm of piriformis, aggravated by sitting. Do think physical therapy will be helpful to him and he plans on completing this. Can continue muscle relaxer. Will increase gabapentin as below, patient declines repeat steroid taper.   - gabapentin (NEURONTIN) 300 MG capsule; 300 mg in morning, 100 mg in  afternoon and evening. Day 3, 300 mg in morning & afternoon &100 mg in the evening. Day 6 take 300 mg 3x daily.  Dispense: 90 capsule; Refill: 0    Follow up plan: Return if symptoms worsen or fail to improve.  Carles Collet, PA-C [facility] 10/24/2017, 4:53 PM

## 2017-10-26 DIAGNOSIS — M25651 Stiffness of right hip, not elsewhere classified: Secondary | ICD-10-CM | POA: Diagnosis not present

## 2017-10-26 DIAGNOSIS — M25551 Pain in right hip: Secondary | ICD-10-CM | POA: Diagnosis not present

## 2017-10-30 DIAGNOSIS — M25551 Pain in right hip: Secondary | ICD-10-CM | POA: Diagnosis not present

## 2017-10-30 DIAGNOSIS — M25651 Stiffness of right hip, not elsewhere classified: Secondary | ICD-10-CM | POA: Diagnosis not present

## 2017-11-01 ENCOUNTER — Ambulatory Visit
Admission: RE | Admit: 2017-11-01 | Discharge: 2017-11-01 | Disposition: A | Discharge status: Home / Self Care | Payer: Medicare Other | Source: Ambulatory Visit | Attending: Orthopedic Surgery | Admitting: Orthopedic Surgery

## 2017-11-01 DIAGNOSIS — M1611 Unilateral primary osteoarthritis, right hip: Secondary | ICD-10-CM | POA: Insufficient documentation

## 2017-11-01 DIAGNOSIS — M79651 Pain in right thigh: Secondary | ICD-10-CM | POA: Diagnosis not present

## 2017-11-02 ENCOUNTER — Telehealth: Payer: Self-pay | Admitting: Family Medicine

## 2017-11-02 NOTE — Telephone Encounter (Signed)
Called pt in regards to [id number] left message to call us back need to find out what medicine was supposed to be changed and to advise that we don't put x-rays on disk he would have to go to where the x-ray was taken to get it done.

## 2017-11-07 DIAGNOSIS — M25651 Stiffness of right hip, not elsewhere classified: Secondary | ICD-10-CM | POA: Diagnosis not present

## 2017-11-07 DIAGNOSIS — M25551 Pain in right hip: Secondary | ICD-10-CM | POA: Diagnosis not present

## 2017-11-09 ENCOUNTER — Telehealth: Payer: Self-pay | Admitting: Physician Assistant

## 2017-11-09 DIAGNOSIS — M25552 Pain in left hip: Principal | ICD-10-CM

## 2017-11-09 DIAGNOSIS — M25551 Pain in right hip: Secondary | ICD-10-CM

## 2017-11-09 DIAGNOSIS — M25651 Stiffness of right hip, not elsewhere classified: Secondary | ICD-10-CM | POA: Diagnosis not present

## 2017-11-09 MED ORDER — GABAPENTIN 300 MG PO CAPS: 300.0000 mg | ORAL_CAPSULE | Freq: Three times a day (TID) | ORAL | 0 refills | Status: DC

## 2017-11-09 NOTE — Telephone Encounter (Signed)
Sent gabapentin Rx into pharmacy at new dose. He will need to contact medical records/imaging to get a copy of his xray as we do not give these out.     Copied from [place] [phone]. Topic: General - Other >> Nov 02, 2017 10:24 AM Yvette Rack wrote: Reason for CRM: Pt states he was told that his medication would be changed however when he contacted his pharmacy no Rx had been sent. Pt also request a copy of his most recent x-ray copied on to a disk so he can provide it to his Chiropractor. Pt requests call back. Cb# [phone] >> Nov 02, 2017  4:23 PM Stark Klein wrote: Pt stated that he does not have enough gabapentin to do the medication change.

## 2017-11-09 NOTE — Telephone Encounter (Signed)
Patient notified of Adriana's message and verbalized understanding. While on the line, patient states that he has been going to PT and the chiropractor. He states that they suggested that he get x-rays of his knees because he states this is the source of his pain right now.

## 2017-11-15 ENCOUNTER — Other Ambulatory Visit: Payer: Self-pay | Admitting: Family Medicine

## 2017-11-15 NOTE — Telephone Encounter (Signed)
Cyclobenzaprine 5 mg refill Last Refill:10/16/17 # 30 Last OV: 10/24/17 PCP: [place]: [facility]

## 2017-11-17 NOTE — Telephone Encounter (Signed)
Please have him schedule a follow up OV to discuss this. We discussed hip pain traveling down into leg, not knee pain in particular.

## 2017-11-20 NOTE — Telephone Encounter (Signed)
Please call and schedule the patient an appointment per [facility]. Thanks.

## 2017-11-20 NOTE — Telephone Encounter (Signed)
Called LVM for patient to call back to schedule an appointment to discuss the issue regarding his knees

## 2017-12-04 NOTE — Telephone Encounter (Signed)
Tried to reach patient to follow up to see if he wanted to schedule appointment to discuss the issues with his knees.  LVM that he could call the office back to schedule  Just FYI

## 2017-12-11 DIAGNOSIS — Z23 Encounter for immunization: Secondary | ICD-10-CM | POA: Diagnosis not present

## 2017-12-25 ENCOUNTER — Ambulatory Visit (INDEPENDENT_AMBULATORY_CARE_PROVIDER_SITE_OTHER): Payer: Medicare Other | Admitting: Family Medicine

## 2017-12-25 ENCOUNTER — Encounter: Payer: Self-pay | Admitting: Family Medicine

## 2017-12-25 VITALS — BP 127/85 | HR 62 | Temp 97.3°F | Wt 202.0 lb

## 2017-12-25 DIAGNOSIS — H60331 Swimmer's ear, right ear: Secondary | ICD-10-CM

## 2017-12-25 MED ORDER — NEOMYCIN-POLYMYXIN-HC 3.5-10000-1 OT SUSP: 4.0000 [drp] | Freq: Four times a day (QID) | OTIC | 1 refills | Status: DC

## 2017-12-25 NOTE — Progress Notes (Signed)
BP 127/85   Pulse 62   Temp (!) 97.3 F (36.3 C) (Axillary)   Wt 202 lb (91.6 kg)   SpO2 97%   BMI 30.27 kg/m    Subjective:    Patient ID: Matthew Maldonado, male    DOB: 1950-07-07, 67 y.o.   MRN: [id number]  HPI: Matthew Maldonado is a 67 y.o. male  Chief Complaint  Patient presents with  . Ear Pain    Pt recently returned from a sub[place] diving trip  Patient's been swimming a lot with travel back home did not have any trouble with the equalizing his ears and had normal hearing but once he got home his ear seem to stop up is drained it is been tender to the touch.  Relevant past medical, surgical, family and social history reviewed and updated as indicated. Interim medical history since our last visit reviewed. Allergies and medications reviewed and updated.  Review of Systems  Constitutional: Negative.   Respiratory: Negative.   Cardiovascular: Negative.     Per HPI unless specifically indicated above     Objective:    BP 127/85   Pulse 62   Temp (!) 97.3 F (36.3 C) (Axillary)   Wt 202 lb (91.6 kg)   SpO2 97%   BMI 30.27 kg/m   Wt Readings from Last 3 Encounters:  12/25/17 202 lb (91.6 kg)  10/24/17 197 lb 4.8 oz (89.5 kg)  10/16/17 194 lb 9.6 oz (88.3 kg)    Physical Exam  Constitutional: He is oriented to person, place, and time. He appears well-developed and well-nourished.  HENT:  Head: Normocephalic and atraumatic.  Left Ear: External ear normal.  Nose: Nose normal.  Mouth/Throat: Oropharynx is clear and moist.  Right ear canal inflamed tender with TM slightly injected not retracted no perforations.  Eyes: Conjunctivae and EOM are normal.  Neck: Normal range of motion.  Cardiovascular: Normal rate, regular rhythm and normal heart sounds.  Pulmonary/Chest: Effort normal and breath sounds normal.  Musculoskeletal: Normal range of motion.  Neurological: He is alert and oriented to person, place, and time.  Skin: No erythema.  Psychiatric: He has a normal  mood and affect. His behavior is normal. Judgment and thought content normal.    Results for orders placed or performed in visit on 09/06/17  Comprehensive metabolic panel  Result Value Ref Range   Glucose 91 65 - 99 mg/dL   BUN 23 8 - 27 mg/dL   Creatinine, Ser 0.87 0.76 - 1.27 mg/dL   GFR calc non Af Amer 90 >59 mL/min/1.73   GFR calc Af Amer 104 >59 mL/min/1.73   BUN/Creatinine Ratio 26 (H) 10 - 24   Sodium 142 134 - 144 mmol/L   Potassium 4.4 3.5 - 5.2 mmol/L   Chloride 105 96 - 106 mmol/L   CO2 20 20 - 29 mmol/L   Calcium 9.5 8.6 - 10.2 mg/dL   Total Protein 6.9 6.0 - 8.5 g/dL   Albumin 4.9 (H) 3.6 - 4.8 g/dL   Globulin, Total 2.0 1.5 - 4.5 g/dL   Albumin/Globulin Ratio 2.5 (H) 1.2 - 2.2   Bilirubin Total 0.7 0.0 - 1.2 mg/dL   Alkaline Phosphatase 74 39 - 117 IU/L   AST 41 (H) 0 - 40 IU/L   ALT 40 0 - 44 IU/L  Lipid Panel w/o Chol/HDL Ratio  Result Value Ref Range   Cholesterol, Total 167 100 - 199 mg/dL   Triglycerides 91 0 - 149 mg/dL  HDL 52 >39 mg/dL   VLDL Cholesterol Cal 18 5 - 40 mg/dL   LDL Calculated 97 0 - 99 mg/dL      Assessment & Plan:   Problem List Items Addressed This Visit    None    Visit Diagnoses    Acute swimmer's ear of right side    -  Primary      Discussed otitis externa care and treatment prevention use of eardrops and if not getting better will need to recheck.  Follow up plan: Return for As scheduled.

## 2017-12-29 ENCOUNTER — Ambulatory Visit: Payer: Self-pay

## 2017-12-29 DIAGNOSIS — H60331 Swimmer's ear, right ear: Secondary | ICD-10-CM

## 2017-12-29 DIAGNOSIS — H9201 Otalgia, right ear: Secondary | ICD-10-CM

## 2017-12-29 NOTE — Telephone Encounter (Signed)
We can see him to see if the swimmer's ear is getting better or not. I'm also happy to refer him to ENT if he'd prefer to go there.

## 2017-12-29 NOTE — Addendum Note (Signed)
Addended by: Valerie Roys on: 12/29/2017 02:17 PM   Modules accepted: Orders

## 2017-12-29 NOTE — Telephone Encounter (Signed)
Phone call returned to pt.  Reported that his right ear continues to feel "blocked".  Has been using the antibiotic ear gtts. as prescribed.  Denied fever/ chills.  Denied redness or warmth of the right ear.  Denied any drainage from the right ear, however, voiced concern that, due to the blocked right ear, the ear gtts. run out, and he does not feel any medication going into the ear.  C/o pain under the right jaw; described as "mild and dull."  Reported he hasn't felt dizzy, but did feel "a little off balance", while walking the dog yesterday.  Advised will contact Dr. Durenda Age nurse for further recommendations, since he was just in office on 10/14.  The pt. stated "I don't know what they're going to do...frankly, I think I need to see an ENT physician."     Called [facility]; spoke with Santiago Glad.  Informed of above complaints and request for referral to ENT.  Will send note to the office for Dr. Durenda Age review/ recommendation.  Pt. advised of plan; agreed.      Reason for Disposition . [1] Taking antibiotic (ear drops or pills) > 72 hours (3 days) AND [2] ear pain not improved or recurs    Continue c/o blocked right ear with dull pain in jaw;  On antibiotic ear gtts. Since 10/14 without improvement.  Answer Assessment - Initial Assessment Questions 1. ANTIBIOTIC: "What antibiotic are you receiving?" "How many times per day?" "Ear drops or pills"    Neomycin ear gtts.  2. ONSET: "When was the antibiotic started?"    10/14/193. PAIN: "How bad is the pain?"  (Scale 1-10; mild, moderate or severe)   - MILD (1-3): doesn't interfere with normal activities    - MODERATE (4-7): interferes with normal activities or awakens from sleep    - SEVERE (8-10): excruciating pain, unable to do any normal activities      Mild, constant 4. DISCHARGE: "Is there any discharge? What color is it?"      Denied 5. FEVER: "Do you have a fever?" If so, ask: "What is your temperature, how was it measured, and when did it start?"    Denied 6. OTHER SYMPTOMS: "Do you have any other symptoms?" (e.g., redness of ear, headache, stiff neck, sore throat)    Denied redness or warmth of the right ear; denied headache; c/o feeling "a little bit off balance"  Protocols used: EAR - OTITIS EXTERNA FOLLOW-UP CALL-A-AH  Message from Adelene Idler sent at 12/29/2017 10:33 AM EDT   Patient called in and stated that he is having loss of hearing in right ear was seen 12/25/2017 and was prescribed meds but no improvement

## 2017-12-29 NOTE — Telephone Encounter (Signed)
Patient would like to go see ENT

## 2018-01-04 DIAGNOSIS — J342 Deviated nasal septum: Secondary | ICD-10-CM | POA: Diagnosis not present

## 2018-01-04 DIAGNOSIS — R0981 Nasal congestion: Secondary | ICD-10-CM | POA: Diagnosis not present

## 2018-01-04 DIAGNOSIS — H6121 Impacted cerumen, right ear: Secondary | ICD-10-CM | POA: Diagnosis not present

## 2018-01-04 DIAGNOSIS — J3489 Other specified disorders of nose and nasal sinuses: Secondary | ICD-10-CM | POA: Diagnosis not present

## 2018-01-04 DIAGNOSIS — H6500 Acute serous otitis media, unspecified ear: Secondary | ICD-10-CM | POA: Diagnosis not present

## 2018-01-04 DIAGNOSIS — H6980 Other specified disorders of Eustachian tube, unspecified ear: Secondary | ICD-10-CM | POA: Diagnosis not present

## 2018-01-04 DIAGNOSIS — L728 Other follicular cysts of the skin and subcutaneous tissue: Secondary | ICD-10-CM | POA: Diagnosis not present

## 2018-02-02 DIAGNOSIS — J342 Deviated nasal septum: Secondary | ICD-10-CM | POA: Diagnosis not present

## 2018-02-02 DIAGNOSIS — J3489 Other specified disorders of nose and nasal sinuses: Secondary | ICD-10-CM | POA: Diagnosis not present

## 2018-02-02 DIAGNOSIS — H6980 Other specified disorders of Eustachian tube, unspecified ear: Secondary | ICD-10-CM | POA: Diagnosis not present

## 2018-02-02 DIAGNOSIS — J392 Other diseases of pharynx: Secondary | ICD-10-CM | POA: Diagnosis not present

## 2018-02-07 DIAGNOSIS — J342 Deviated nasal septum: Secondary | ICD-10-CM | POA: Diagnosis not present

## 2018-02-19 ENCOUNTER — Ambulatory Visit (INDEPENDENT_AMBULATORY_CARE_PROVIDER_SITE_OTHER): Payer: Medicare Other | Admitting: Family Medicine

## 2018-02-19 ENCOUNTER — Encounter: Payer: Self-pay | Admitting: Family Medicine

## 2018-02-19 VITALS — BP 128/82 | HR 66 | Temp 98.6°F | Resp 16 | Ht 68.5 in | Wt 201.6 lb

## 2018-02-19 DIAGNOSIS — M5442 Lumbago with sciatica, left side: Secondary | ICD-10-CM | POA: Diagnosis not present

## 2018-02-19 DIAGNOSIS — F338 Other recurrent depressive disorders: Secondary | ICD-10-CM

## 2018-02-19 DIAGNOSIS — L219 Seborrheic dermatitis, unspecified: Secondary | ICD-10-CM | POA: Diagnosis not present

## 2018-02-19 DIAGNOSIS — M5441 Lumbago with sciatica, right side: Secondary | ICD-10-CM

## 2018-02-19 DIAGNOSIS — M150 Primary generalized (osteo)arthritis: Secondary | ICD-10-CM | POA: Diagnosis not present

## 2018-02-19 DIAGNOSIS — G8929 Other chronic pain: Secondary | ICD-10-CM

## 2018-02-19 DIAGNOSIS — F411 Generalized anxiety disorder: Secondary | ICD-10-CM | POA: Diagnosis not present

## 2018-02-19 DIAGNOSIS — I471 Supraventricular tachycardia: Secondary | ICD-10-CM | POA: Diagnosis not present

## 2018-02-19 DIAGNOSIS — G4700 Insomnia, unspecified: Secondary | ICD-10-CM | POA: Diagnosis not present

## 2018-02-19 DIAGNOSIS — Z7689 Persons encountering health services in other specified circumstances: Secondary | ICD-10-CM

## 2018-02-19 DIAGNOSIS — E782 Mixed hyperlipidemia: Secondary | ICD-10-CM

## 2018-02-19 DIAGNOSIS — I6602 Occlusion and stenosis of left middle cerebral artery: Secondary | ICD-10-CM | POA: Diagnosis not present

## 2018-02-19 DIAGNOSIS — M159 Polyosteoarthritis, unspecified: Secondary | ICD-10-CM | POA: Insufficient documentation

## 2018-02-19 MED ORDER — BACLOFEN 10 MG PO TABS: 5.0000 mg | ORAL_TABLET | Freq: Three times a day (TID) | ORAL | 2 refills | Status: DC | PRN

## 2018-02-19 MED ORDER — KETOCONAZOLE 2 % EX SHAM: MEDICATED_SHAMPOO | Freq: Every day | CUTANEOUS | 1 refills | Status: DC | PRN

## 2018-02-19 MED ORDER — MELOXICAM 15 MG PO TABS: 15.0000 mg | ORAL_TABLET | Freq: Every day | ORAL | 2 refills | Status: DC | PRN

## 2018-02-19 MED ORDER — HYDROXYZINE PAMOATE 25 MG PO CAPS: 25.0000 mg | ORAL_CAPSULE | Freq: Every evening | ORAL | 1 refills | Status: DC | PRN

## 2018-02-19 NOTE — Assessment & Plan Note (Signed)
Previous labs 08/2017 Continues on Atorvastatin high dose

## 2018-02-19 NOTE — Assessment & Plan Note (Signed)
Identified on MRI 12/2015 thru [facility] Now on statin, ASA, BB as preventative Follow-up in future as needed

## 2018-02-19 NOTE — Assessment & Plan Note (Signed)
Chronic b/l LBP with associated b/l vs alternating sciatica. Suspect likely due to multiple factors chronic OA/DJD, some activity related muscle strain, lifting or movement based, also active golfer - no prior surgery - No red flag symptoms. Negative SLR for radiculopathy - Improved on topical NSAID, has not maxed out conservative therapy - Already seen Orthopedic. Last X-rays L spine, hips 10/2017 reviewed DJD Failed Tramadol, Gabapentin, Flexeril 5mg  - ineffective  Plan: 1. Counseling on dx and scope of treatment for back pain / hip pain / OA-DJD - Start high dose Tylenol regimen up to 1000mg  TID regularly then PRN - Add rx Meloxicam 15mg  daily wc for 1 week then PRN can repeat up to 1 week max twice a month, avoid prolong use due to history of Minimal Change Disease kidney - currently function is normal - May use Voltaren topical gel when not using Meloxicam - Start muscle relaxant with Baclofen 10mg  tabs - take 5-10mg  up to TID PRN, titrate up as tolerated - caution sedation - Encouraged use of heating pad 1-2x daily for now then PRN - Continue activity modification avoid injury Follow-up 4-6 weeks if not improved for re-evaluation, consider rx adjustment, refer to PT, or possibly PM&R for instance Dr Letta Pate for further interventional therapy

## 2018-02-19 NOTE — Assessment & Plan Note (Addendum)
Clinically with persistent mild to moderate anxiety, seems more generalized and daily Improved on current medicine regimen Affects his resting and sleep at night some insomnia Failed Cymbalta in past  Plan Continue Buspar, Venlafaxine - Refilled Hydroxyzine for PRN use - primarily for insomnia - Follow-up future may adjust dose inc Venlafaxine may help central / nerve pain 

## 2018-02-19 NOTE — Assessment & Plan Note (Addendum)
Secondary to anxiety and pain most likely Treating underlying OA/DJD and Anxiety See A&P - refilled Hydroxyzine for PRN use nightly

## 2018-02-19 NOTE — Assessment & Plan Note (Signed)
See A&P for back pain Affected joints lumbar spine back, bilateral hips - Treat OA/DJD

## 2018-02-19 NOTE — Assessment & Plan Note (Signed)
Clinically with SAD secondary to variety of factors, pain, anxiety See A&P for anxiety  Continue current SNRI venlafaxine and Buspar for anxiety Follow-up 

## 2018-02-19 NOTE — Assessment & Plan Note (Signed)
Stable, chronic problem History of episodes of Paroxysmal SVT On BB metoprolol, low dose Follow-up 

## 2018-02-19 NOTE — Progress Notes (Signed)
Subjective:    Patient ID: Matthew Maldonado, male    DOB: [birth year], 67 y.o.   MRN: [id number]  Matthew Maldonado is a 67 y.o. male presenting on 02/19/2018 for Establish Care (lower back, hip, leg knee pain bilateral ) and Sciatica  He is here to transfer care to [facility] as new PCP. Previously was cared for at [facility], he now has preference to change PCP.  HPI   Low Back Pain / Hip Pain / Knee Pain / Sciatica / Insomnia Reports symptoms started last June 2019, also history of Iliotibial Band issues. Describes these muscle problems bother him more at night, and it will keep him awake at night. He is able to ambulate and movement helps his pain at times during the day - He uses Voltaren gel topical PRN with some good relief, but does not resolve the issue - For his sciatica, he goes to acupuncture therapy, with some relief - tried Prednisone taper 10/2017 - Tried Gabapentin 300mg  TID, previously self discontinued back in 11/2017 - ineffective - Trial in past with Cyclobenzaprine 5mg  TID PRN - mixed results, never been on other muscle relaxants - In past he was treated with Tramadol and also Hydrocodone PRN, he states Tramadol was ineffective. He tried Hydrocodone rarely PRN at bedtime, did not during the day - Admits hard time relaxing, and affects some sleeping difficulty, insomnia - he tries to read when first goes to bed. He has seen [employer], he thinks pain in knee is referred pain. He has had cortisone injection, temporarily relief only. - he has done PT as well, uses some home stretching activities High arch and instep - he has custom shoes / insert. Also admits knee stiffness if prolonged seated or wrong chair, with lip that compresses on nerve behind knees Not played golf since 08/2017, now less active, sedentary He may take Aleve or NSAID PRN preventative. History of broken R Toe - affected his gait - Denies any fevers/chills, numbness, tingling, weakness, loss of control bladder/bowel  incontinence or retention, unintentional wt loss, night sweats  Minimal Change Disorder History of [facility] following, he had issues with protein in urine. He would take high dose prednisone 60mg  daily for months, and seems to be in remission. Had issues for previous 10 years. Now he only follow up if flare up with [facility]  Anxiety / Mikki Santee Reports chronic history of some anxiety. No depression. See scores below. Improved on Buspar and Venlafaxine, hydroxyzine  Allergic Rhinitis He has seen [facility] (Dr Margaretha Sheffield) in past. He uses Flonase History of scuba diving, had issue in past with R ear pain after scuba dive, he had issue on that R side. He was dx with fluid behind R ear. And it has improved. He will return this week. - History of prior nasal fractures, he has 90% blockage on R side of nose, in future 2020 he will anticipate deviated septum repair.  CHRONIC HTN: Reports usually checks BP avg 110/70s on avg. Current Meds - Metoprolol XL 12.5mg  daily (HALF Of 25mg  tablet) Reports good compliance, took meds today. Tolerating well, w/o complaints. Denies CP, dyspnea, HA, edema, dizziness / lightheadedness  HYPERLIPIDEMIA / Middle Cerebral Artery Stenosis - Reports no concerns. Last lipid panel 08/2017 - Currently taking Atorvastatin 80mg , tolerating well without side effects or myalgias - Taking Aspirin 81mg  daily Reports a few episodes in past with difficulty controlling R arm in past, had some neurological symptoms, he has seen Neurology had imaging  MRA brain back in 2017, showed some stenosis, now he is on preventative regimen with statin and BB, aspirin. Has not had any issues.   Depression screen [facility] 2/9 02/19/2018 02/19/2018 10/24/2017  Decreased Interest 0 0 0  Down, Depressed, Hopeless 0 0 0  PHQ - 2 Score 0 0 0  Altered sleeping 1 - 3  Tired, decreased energy 1 - 2  Change in appetite 0 - 0  Feeling bad or failure about yourself  0 - 0  Trouble concentrating  1 - 0  Moving slowly or fidgety/restless 0 - 0  Suicidal thoughts 0 - 0  PHQ-9 Score 3 - 5  Difficult doing work/chores Not difficult at all - -   GAD 7 : Generalized Anxiety Score 02/19/2018 10/24/2017 09/06/2017  Nervous, Anxious, on Edge 1 0 1  Control/stop worrying 0 0 1  Worry too much - different things 1 0 1  Trouble relaxing 1 0 1  Restless 0 0 0  Easily annoyed or irritable 1 0 1  Afraid - awful might happen 0 0 0  Total GAD 7 Score 4 0 5  Anxiety Difficulty Somewhat difficult - Not difficult at all     Past Medical History:  Diagnosis Date  . Anxiety   . 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   Marland Kitchen BPH (benign prostatic hyperplasia)   . Insomnia   . Minimal change disease   . Nocturia    History reviewed. No pertinent surgical history. Social History   Socioeconomic History  . Marital status: Married    Spouse name: quein  . Number of children: 0  . Years of education: Not on file  . Highest education level: Some college, no degree  Occupational History    Comment: retired  Scientific laboratory technician  . Financial resource strain: Not hard at all  . Food insecurity:    Worry: Never true    Inability: Never true  . Transportation needs:    Medical: No    Non-medical: No  Tobacco Use  . Smoking status: Current Every Day Smoker    Types: Cigars  . Smokeless tobacco: Current User  . Tobacco comment: 1 or 2 a day   Substance and Sexual Activity  . Alcohol use: Yes    Alcohol/week: 18.0 standard drinks    Types: 1 Cans of beer, 14 Shots of liquor, 3 Standard drinks or equivalent per week  . Drug use: No  . Sexual activity: Not Currently  Lifestyle  . Physical activity:    Days per week: 0 days    Minutes per session: 0 min  . Stress: Rather much  Relationships  . Social connections:    Talks on phone: Three times a week    Gets together: Never    Attends religious service: Never    Active member of club or organization:  Yes    Attends meetings of clubs or organizations: More than 4 times per year    Relationship status: Married  . Intimate partner violence:    Fear of current or ex partner: No    Emotionally abused: No    Physically abused: No    Forced sexual activity: No  Other Topics Concern  . Not on file  Social History Narrative  . Not on file   Family History  Problem Relation Age of Onset  . Osteoporosis Mother   . Heart disease Father   . Heart disease  Brother   . Alcohol abuse Brother   . Kidney disease Neg Hx   . Prostate cancer Neg Hx    Current Outpatient Medications on File Prior to Visit  Medication Sig  . aspirin EC 81 MG tablet Take 81 mg by mouth daily.  Marland Kitchen atorvastatin (LIPITOR) 80 MG tablet Take 1 tablet (80 mg total) by mouth daily at 6 PM.  . busPIRone (BUSPAR) 15 MG tablet Take 1 tablet (15 mg total) by mouth 2 (two) times daily.  Marland Kitchen co-enzyme Q-10 30 MG capsule Take 30 mg by mouth daily.   Marland Kitchen esomeprazole (NEXIUM) 20 MG capsule Take 1 capsule (20 mg total) by mouth daily at 12 noon.  . fluticasone (FLONASE) 50 MCG/ACT nasal spray Place 2 sprays into both nostrils daily.  . fluticasone (FLONASE) 50 MCG/ACT nasal spray Place 2 sprays into both nostrils daily.  Marland Kitchen HYDROcodone-acetaminophen (NORCO/VICODIN) 5-325 MG tablet Take 1 tablet by mouth every 6 (six) hours as needed for moderate pain.  Marland Kitchen loratadine (CLARITIN) 10 MG tablet Take by mouth.  . metoprolol succinate (TOPROL-XL) 25 MG 24 hr tablet Take 1 tablet (25 mg total) by mouth daily.  . Multiple Vitamins-Minerals (MULTIVITAMIN WITH MINERALS) tablet Take 1 tablet by mouth daily.  Marland Kitchen neomycin-polymyxin-hydrocortisone (CORTISPORIN) 3.5-10000-1 OTIC suspension Place 4 drops into both ears 4 (four) times daily.  Marland Kitchen triamcinolone ointment (KENALOG) 0.5 % Apply 1 application topically 2 (two) times daily.  . TURMERIC CURCUMIN PO Take by mouth.  . venlafaxine XR (EFFEXOR XR) 75 MG 24 hr capsule Take 1 capsule (75 mg total) by  mouth daily with breakfast.   No current facility-administered medications on file prior to visit.     Review of Systems Per HPI unless specifically indicated above     Objective:    BP 128/82   Pulse 66   Temp 98.6 F (37 C) (Oral)   Resp 16   Ht 5' 8.5" (1.74 m)   Wt 201 lb 9.6 oz (91.4 kg)   BMI 30.21 kg/m   Wt Readings from Last 3 Encounters:  02/19/18 201 lb 9.6 oz (91.4 kg)  12/25/17 202 lb (91.6 kg)  10/24/17 197 lb 4.8 oz (89.5 kg)    Physical Exam  Constitutional: He is oriented to person, place, and time. He appears well-developed and well-nourished. No distress.  Well-appearing, comfortable, cooperative  HENT:  Head: Normocephalic and atraumatic.  Mouth/Throat: Oropharynx is clear and moist.  Eyes: Conjunctivae are normal. Right eye exhibits no discharge. Left eye exhibits no discharge.  Cardiovascular: Normal rate.  Pulmonary/Chest: Effort normal.  Musculoskeletal: He exhibits no edema.  Low Back / Hips Inspection: BACK - Normal appearance, no spinal deformity, symmetrical. HIP - Normal appearance, symmetrical, no obvious leg length or pelvis deformity  Palpation: BACK - No tenderness over spinous processes. Bilateral lumbar paraspinal muscles non-tender and without hypertonicity/spasm - localized area of discomfort lower lumbar paraspinal SI region but non reproducible tender  HIP -Non tender over greater trochanter today. Lower extremity thigh calf soft non tender no spasm.  ROM: BACK - Full active ROM forward flex / back extension, rotation L/R without discomfort HIP - Bilateral hip flex/ext supine normal, normal without problem or limitation.  Special Testing: BACK - Seated SLR negative for radicular pain bilaterally - has some muscle tightness only. HIP - FABER normal and non tender no limited movement. FADIR has mildly reduced movement and some pain internal rotation symmetrical bilateral. Acetabular compression of bilateral hips is slightly  uncomfortable but no actual  reproduced pain  Strength: Bilateral hip flex/ext 5/5, knee flex/ext 5/5, ankle dorsiflex/plantarflex 5/5 Neurovascular: intact distal sensation to light touch   Neurological: He is alert and oriented to person, place, and time.  Skin: Skin is warm and dry. No rash noted. He is not diaphoretic. No erythema.  Psychiatric: He has a normal mood and affect. His behavior is normal.  Well groomed, good eye contact, normal speech and thoughts  Nursing note and vitals reviewed.  Results for orders placed or performed in visit on 09/06/17  Comprehensive metabolic panel  Result Value Ref Range   Glucose 91 65 - 99 mg/dL   BUN 23 8 - 27 mg/dL   Creatinine, Ser 0.87 0.76 - 1.27 mg/dL   GFR calc non Af Amer 90 >59 mL/min/1.73   GFR calc Af Amer 104 >59 mL/min/1.73   BUN/Creatinine Ratio 26 (H) 10 - 24   Sodium 142 134 - 144 mmol/L   Potassium 4.4 3.5 - 5.2 mmol/L   Chloride 105 96 - 106 mmol/L   CO2 20 20 - 29 mmol/L   Calcium 9.5 8.6 - 10.2 mg/dL   Total Protein 6.9 6.0 - 8.5 g/dL   Albumin 4.9 (H) 3.6 - 4.8 g/dL   Globulin, Total 2.0 1.5 - 4.5 g/dL   Albumin/Globulin Ratio 2.5 (H) 1.2 - 2.2   Bilirubin Total 0.7 0.0 - 1.2 mg/dL   Alkaline Phosphatase 74 39 - 117 IU/L   AST 41 (H) 0 - 40 IU/L   ALT 40 0 - 44 IU/L  Lipid Panel w/o Chol/HDL Ratio  Result Value Ref Range   Cholesterol, Total 167 100 - 199 mg/dL   Triglycerides 91 0 - 149 mg/dL   HDL 52 >39 mg/dL   VLDL Cholesterol Cal 18 5 - 40 mg/dL   LDL Calculated 97 0 - 99 mg/dL      Assessment & Plan:   Problem List Items Addressed This Visit    Chronic low back pain with bilateral sciatica    Chronic b/l LBP with associated b/l vs alternating sciatica. Suspect likely due to multiple factors chronic OA/DJD, some activity related muscle strain, lifting or movement based, also active golfer - no prior surgery - No red flag symptoms. Negative SLR for radiculopathy - Improved on topical NSAID, has not  maxed out conservative therapy - Already seen Orthopedic. Last X-rays L spine, hips 10/2017 reviewed DJD Failed Tramadol, Gabapentin, Flexeril 5mg  - ineffective  Plan: 1. Counseling on dx and scope of treatment for back pain / hip pain / OA-DJD - Start high dose Tylenol regimen up to 1000mg  TID regularly then PRN - Add rx Meloxicam 15mg  daily wc for 1 week then PRN can repeat up to 1 week max twice a month, avoid prolong use due to history of Minimal Change Disease kidney - currently function is normal - May use Voltaren topical gel when not using Meloxicam - Start muscle relaxant with Baclofen 10mg  tabs - take 5-10mg  up to TID PRN, titrate up as tolerated - caution sedation - Encouraged use of heating pad 1-2x daily for now then PRN - Continue activity modification avoid injury Follow-up 4-6 weeks if not improved for re-evaluation, consider rx adjustment, refer to PT, or possibly PM&R for instance Dr Letta Pate for further interventional therapy      Relevant Medications   hydrOXYzine (VISTARIL) 25 MG capsule   meloxicam (MOBIC) 15 MG tablet   baclofen (LIORESAL) 10 MG tablet   GAD (generalized anxiety disorder) - Primary  Clinically with persistent mild to moderate anxiety, seems more generalized and daily Improved on current medicine regimen Affects his resting and sleep at night some insomnia Failed Cymbalta in past  Plan Continue Buspar, Venlafaxine - Refilled Hydroxyzine for PRN use - primarily for insomnia - Follow-up future may adjust dose inc Venlafaxine may help central / nerve pain      Relevant Medications   hydrOXYzine (VISTARIL) 25 MG capsule   Hyperlipidemia    Previous labs 08/2017 Continues on Atorvastatin high dose      Insomnia, persistent    Secondary to anxiety and pain most likely Treating underlying OA/DJD and Anxiety See A&P - refilled Hydroxyzine for PRN use nightly      Middle cerebral artery stenosis, left    Identified on MRI 12/2015 thru [facility]  Neurology Now on statin, ASA, BB as preventative Follow-up in future as needed      Paroxysmal supraventricular tachycardia (HCC)    Stable, chronic problem History of episodes of Paroxysmal SVT On BB metoprolol, low dose Follow-up      Primary osteoarthritis involving multiple joints    See A&P for back pain Affected joints lumbar spine back, bilateral hips - Treat OA/DJD      Relevant Medications   meloxicam (MOBIC) 15 MG tablet   baclofen (LIORESAL) 10 MG tablet   Seasonal affective disorder (HCC)    Clinically with SAD secondary to variety of factors, pain, anxiety See A&P for anxiety  Continue current SNRI venlafaxine and Buspar for anxiety Follow-up      Relevant Medications   hydrOXYzine (VISTARIL) 25 MG capsule   Seborrheic dermatitis of scalp   Relevant Medications   ketoconazole (NIZORAL) 2 % shampoo    Other Visit Diagnoses    Encounter to establish care with new doctor        Review outside records from prior PCP and other specialists, [facility]   Meds ordered this encounter  Medications  . hydrOXYzine (VISTARIL) 25 MG capsule    Sig: Take 1-3 capsules (25-75 mg total) by mouth at bedtime as needed (for svere anxiety sx and sleep).    Dispense:  270 capsule    Refill:  1  . ketoconazole (NIZORAL) 2 % shampoo    Sig: Apply topically daily as needed for irritation (scalp). May use 1-2 times a week when needed    Dispense:  120 mL    Refill:  1  . meloxicam (MOBIC) 15 MG tablet    Sig: Take 1 tablet (15 mg total) by mouth daily as needed for pain. For up to 1 week at a time    Dispense:  30 tablet    Refill:  2  . baclofen (LIORESAL) 10 MG tablet    Sig: Take 0.5-1 tablets (5-10 mg total) by mouth 3 (three) times daily as needed for muscle spasms.    Dispense:  60 each    Refill:  2    Follow up plan: Return in about 6 weeks (around 04/02/2018) for 6 wk follow-up back pain / arthritis.  Nobie Putnam, [facility] 02/19/2018, 10:41 AM

## 2018-02-19 NOTE — Patient Instructions (Addendum)
Thank you for coming to the office today.  1. For your Back Pain ./ Hip - I think that this is due to Muscle Spasms or strain. Your Sciatic Nerve can be affected causing some of your radiation and numbness down your legs.  Recommend to start taking Tylenol Extra Strength 500mg  tabs - take 1 to 2 tabs per dose (max 1000mg ) every 6-8 hours for pain (take regularly, don't skip a dose for next 7 days), max 24 hour daily dose is 6 tablets or 3000mg . In the future you can repeat the same everyday Tylenol course for 1-2 weeks at a time.   - This is safe to take with anti-inflammatory medicines (Ibuprofen, Advil, Naproxen, Aleve, Meloxicam, Mobic)  - We can try Meloxicam 15mg  daily anti inflammatory, take with food daily for 1 week at a time, then stop and may repeat course when needed 1-2 times a month for now May keep using Voltaren topical - prefer to not use while taking Meloxicam  Consider Physical Medicine & Rehab - PM&R doctor in future Dr. Charlett Blake  Address: [address], [place], [place] [address]  Phone: [phone]   - Baclofen (Lioresal) 10mg  tablets - cut in half for 5mg  at night for muscle relaxant - may make you sedated or sleepy (be careful driving or working on this) if tolerated you can take every 8 hours, half or whole tab  Recommend to start using heating pad on your lower back 1-2x daily for few weeks  This pain may take weeks to months to fully resolve, but hopefully it will respond to the medicine initially. All back injuries (small or serious) are slow to heal since we use our back muscles every day. Be careful with turning, twisting, lifting, sitting / standing for prolonged periods, and avoid re-injury.  If your symptoms significantly worsen with more pain, or new symptoms with weakness in one or both legs, new or different shooting leg pains, numbness in legs or groin, loss of control or retention of urine or bowel movements, please call back for  advice and you may need to go directly to the Emergency Department.   Please schedule a Follow-up Appointment to: Return in about 6 weeks (around 04/02/2018) for 6 wk follow-up back pain / arthritis.  If you have any other questions or concerns, please feel free to call the office or send a message through [place]. You may also schedule an earlier appointment if necessary.  Additionally, you may be receiving a survey about your experience at our office within a few days to 1 week by e-mail or mail. We value your feedback.  Nobie Putnam, DO [place]

## 2018-02-23 DIAGNOSIS — J3489 Other specified disorders of nose and nasal sinuses: Secondary | ICD-10-CM | POA: Diagnosis not present

## 2018-02-23 DIAGNOSIS — H6980 Other specified disorders of Eustachian tube, unspecified ear: Secondary | ICD-10-CM | POA: Diagnosis not present

## 2018-02-23 DIAGNOSIS — J392 Other diseases of pharynx: Secondary | ICD-10-CM | POA: Diagnosis not present

## 2018-02-23 DIAGNOSIS — H903 Sensorineural hearing loss, bilateral: Secondary | ICD-10-CM | POA: Diagnosis not present

## 2018-02-23 DIAGNOSIS — J342 Deviated nasal septum: Secondary | ICD-10-CM | POA: Diagnosis not present

## 2018-03-16 ENCOUNTER — Other Ambulatory Visit: Payer: Self-pay | Admitting: Family Medicine

## 2018-03-16 DIAGNOSIS — L219 Seborrheic dermatitis, unspecified: Secondary | ICD-10-CM

## 2018-03-20 ENCOUNTER — Encounter: Payer: Self-pay | Admitting: *Deleted

## 2018-03-20 ENCOUNTER — Other Ambulatory Visit: Payer: Self-pay

## 2018-03-21 ENCOUNTER — Encounter: Payer: Medicare Other | Admitting: Family Medicine

## 2018-03-21 ENCOUNTER — Other Ambulatory Visit: Payer: Self-pay | Admitting: Family Medicine

## 2018-03-21 ENCOUNTER — Encounter: Payer: Self-pay | Admitting: Family Medicine

## 2018-03-21 ENCOUNTER — Ambulatory Visit (INDEPENDENT_AMBULATORY_CARE_PROVIDER_SITE_OTHER): Payer: Medicare Other | Admitting: Family Medicine

## 2018-03-21 VITALS — BP 134/88 | HR 60 | Temp 98.2°F | Resp 16 | Ht 68.5 in | Wt 203.6 lb

## 2018-03-21 DIAGNOSIS — Q667 Congenital pes cavus, unspecified foot: Secondary | ICD-10-CM | POA: Diagnosis not present

## 2018-03-21 DIAGNOSIS — M25562 Pain in left knee: Secondary | ICD-10-CM | POA: Diagnosis not present

## 2018-03-21 DIAGNOSIS — M5442 Lumbago with sciatica, left side: Secondary | ICD-10-CM

## 2018-03-21 DIAGNOSIS — M25561 Pain in right knee: Secondary | ICD-10-CM | POA: Diagnosis not present

## 2018-03-21 DIAGNOSIS — M159 Polyosteoarthritis, unspecified: Secondary | ICD-10-CM

## 2018-03-21 DIAGNOSIS — N138 Other obstructive and reflux uropathy: Secondary | ICD-10-CM

## 2018-03-21 DIAGNOSIS — G8929 Other chronic pain: Secondary | ICD-10-CM | POA: Diagnosis not present

## 2018-03-21 DIAGNOSIS — M150 Primary generalized (osteo)arthritis: Secondary | ICD-10-CM

## 2018-03-21 DIAGNOSIS — F338 Other recurrent depressive disorders: Secondary | ICD-10-CM

## 2018-03-21 DIAGNOSIS — M25552 Pain in left hip: Secondary | ICD-10-CM

## 2018-03-21 DIAGNOSIS — N401 Enlarged prostate with lower urinary tract symptoms: Principal | ICD-10-CM

## 2018-03-21 DIAGNOSIS — M5441 Lumbago with sciatica, right side: Secondary | ICD-10-CM

## 2018-03-21 DIAGNOSIS — M25551 Pain in right hip: Secondary | ICD-10-CM | POA: Diagnosis not present

## 2018-03-21 DIAGNOSIS — F411 Generalized anxiety disorder: Secondary | ICD-10-CM

## 2018-03-21 DIAGNOSIS — E782 Mixed hyperlipidemia: Secondary | ICD-10-CM

## 2018-03-21 DIAGNOSIS — I471 Supraventricular tachycardia: Secondary | ICD-10-CM

## 2018-03-21 DIAGNOSIS — N050 Unspecified nephritic syndrome with minor glomerular abnormality: Secondary | ICD-10-CM

## 2018-03-21 DIAGNOSIS — R7309 Other abnormal glucose: Secondary | ICD-10-CM

## 2018-03-21 MED ORDER — DICLOFENAC SODIUM 1 % TD GEL: 2.0000 g | Freq: Every day | TRANSDERMAL | 3 refills | Status: DC | PRN

## 2018-03-21 NOTE — Progress Notes (Signed)
Subjective:    Patient ID: Matthew Maldonado, male    DOB: 1950/07/10, 68 y.o.   MRN: [id number]  Matthew Maldonado is a 68 y.o. male presenting on 03/21/2018 for Hip Pain; Knee Pain; and Arthritis (needs refill)   HPI   Osteoarthritis multiple joints / Lumbar Spine Low Back w/ Sciatica / Hips Bilateral / Knees R>L / Pes Cavus - Last visit with me 02/19/18, for initial visit for same problem arthritis joint pain, hip and knee, treated with started regular vs intermittent NSAID Meloxicam 15mg  - he was to take daily for 1 week then off for 1 week, also given rx muscle relaxant Baclofen nightly as needed with some relief, also asked to start a higher dose regular Tylenol Ext Str without significant relief, see prior notes for background information.  Today he still reports having persistent joint pain >6 months now, mostly knees R>L and hips/back, he is ready for further diagnostic testing and 2nd opinion, he has not had prior X-rays of Knee before and he is interested in MRI in future - He describes worsening problem knee pain with increased activity, he attributes this to other factors as well including high arches in feet affecting his gait and prior history of great toe injury limiting his walking pattern, he feels like his lower extremity symptoms are related as well to his hip and low back symptoms with known sciatica radiating pain - Request refill of Voltaren gel topical - uses on R knee, wrist, hand and toe often he purchased brand out of country in [place] and [place] in past, he is due now for refill - He wants to seek new 2nd opinion from local Orthopedics or other specialist on these issues - Last X-rays done in 10/2017 on file show some degenerative arthritis and abnormality of R femur that was followed up with CT R Femur, see results below - Regarding feet and arches he also states has some new shoes around onset of problem thought this may have been related  Medications: - Voltaren topical daily  PRN multiple small joints and knees - Tried Gabapentin 300mg  TID, previously self discontinued back in 11/2017 - ineffective - Trial in past with Cyclobenzaprine 5mg  TID PRN - mixed results, never been on other muscle relaxants - In past he was treated with Tramadol and also Hydrocodone PRN, he states Tramadol was ineffective. He tried Hydrocodone rarely PRN at bedtime, did not during the day  He has seen [facility], he thinks pain in knee is referred pain. He has had cortisone injection, temporarily relief only - he has done PT as well, uses some home stretching activities  Also admits knee stiffness if prolonged seated or wrong chair, with lip that compresses on nerve behind knees Not played golf since 08/2017, now less active, sedentary  Denies any fevers/chills, numbness, tingling, weakness, loss of control bladder/bowel incontinence or retention, unintentional wt loss, night sweats    Additional update R nasal passage is closed, he has scheduled tomorrow at [facility] for nasal sinus surgery correct deviated septum  Depression screen [facility] 2/9 03/21/2018 02/19/2018 02/19/2018  Decreased Interest 0 0 0  Down, Depressed, Hopeless 0 0 0  PHQ - 2 Score 0 0 0  Altered sleeping 0 1 -  Tired, decreased energy 0 1 -  Change in appetite 0 0 -  Feeling bad or failure about yourself  0 0 -  Trouble concentrating 0 1 -  Moving slowly or fidgety/restless 0 0 -  Suicidal  thoughts 0 0 -  PHQ-9 Score 0 3 -  Difficult doing work/chores Not difficult at all Not difficult at all -    Social History   Tobacco Use  . Smoking status: Current Every Day Smoker    Years: 4.00    Types: Cigars  . Smokeless tobacco: Current User  . Tobacco comment: 1 or 2 a day   Substance Use Topics  . Alcohol use: Yes    Alcohol/week: 18.0 standard drinks    Types: 1 Cans of beer, 14 Shots of liquor, 3 Standard drinks or equivalent per week  . Drug use: No    Review of Systems Per HPI unless specifically  indicated above     Objective:    BP 134/88   Pulse 60   Temp 98.2 F (36.8 C) (Oral)   Resp 16   Ht 5' 8.5" (1.74 m)   Wt 203 lb 9.6 oz (92.4 kg)   SpO2 98%   BMI 30.51 kg/m   Wt Readings from Last 3 Encounters:  03/21/18 203 lb 9.6 oz (92.4 kg)  02/19/18 201 lb 9.6 oz (91.4 kg)  12/25/17 202 lb (91.6 kg)    Physical Exam Vitals signs and nursing note reviewed.  Constitutional:      General: He is not in acute distress.    Appearance: He is well-developed. He is not diaphoretic.     Comments: Well-appearing, comfortable, cooperative  HENT:     Head: Normocephalic and atraumatic.  Eyes:     General:        Right eye: No discharge.        Left eye: No discharge.     Conjunctiva/sclera: Conjunctivae normal.  Cardiovascular:     Rate and Rhythm: Normal rate.  Pulmonary:     Effort: Pulmonary effort is normal.  Musculoskeletal:     Comments: Low Back / Hips Inspection: BACK - Normal appearance, no spinal deformity, symmetrical. HIP - Normal appearance, symmetrical, no obvious leg length or pelvis deformity  Palpation: BACK - No tenderness over spinous processes. Bilateral lumbar paraspinal muscles non-tender and without hypertonicity/spasm - localized area of discomfort lower lumbar paraspinal SI region but non reproducible tender  HIP -Non tender over greater trochanter today. Lower extremity thigh calf soft non tender no spasm.  ROM: BACK - Full active ROM forward flex / back extension, rotation L/R without discomfort HIP - Bilateral hip flex/ext supine normal, normal without problem or limitation.  Special Testing: BACK - Seated SLR negative for radicular pain bilaterally - has some muscle tightness only. HIP - FABER normal and non tender no limited movement. FADIR has mildly reduced movement and some pain internal rotation symmetrical bilateral. Acetabular compression of bilateral hips is slightly uncomfortable but no actual reproduced pain  Strength: Bilateral  hip flex/ext 5/5, knee flex/ext 5/5, ankle dorsiflex/plantarflex 5/5 Neurovascular: intact distal sensation to light touch   Skin:    General: Skin is warm and dry.     Findings: No erythema or rash.  Neurological:     Mental Status: He is alert and oriented to person, place, and time.  Psychiatric:        Behavior: Behavior normal.     Comments: Well groomed, good eye contact, normal speech and thoughts    I have personally reviewed the radiology report from 10/16/17 Lumbar X-ray from other provider.  CLINICAL DATA:  Chronic low back pain  EXAM: LUMBAR SPINE - COMPLETE 4+ VIEW  COMPARISON:  01/12/2011  FINDINGS: Five lumbar type  vertebral bodies are well visualized. Mild osteophytic changes are noted. No pars defects are seen. Mild facet hypertrophic changes are seen. No soft tissue abnormality is noted.  IMPRESSION: Mild degenerative change without acute abnormality   Electronically Signed   By: Inez Catalina M.D.   On: 10/16/2017 14:42  ---------------------------------------------------------------------------------------------------------------------  I have personally reviewed the radiology report from 10/17/17 on Hips .  CLINICAL DATA:  Hip pain.  Low back pain.  EXAM: DG HIP (WITH OR WITHOUT PELVIS) 2-3V LEFT  COMPARISON:  No prior.  FINDINGS: No acute bony or joint abnormality identified. No evidence of fracture or dislocation. Degenerative changes left hip.  IMPRESSION: No acute bony abnormality identified. Degenerative changes left hip.   Electronically Signed   By: Marcello Moores  Register   On: 10/17/2017 16:49   --------------------------------------------------------------------------------------------------- CLINICAL DATA:  Pain after lifting.  EXAM: DG HIP (WITH OR WITHOUT PELVIS) 2-3V RIGHT  COMPARISON:  Lumbar spine series 10/16/2017. Lumbar spine series 01/04/2011.  FINDINGS: Degenerative changes lumbar spine and both hips. No  acute bony abnormality identified. No evidence of fracture or dislocation. Tiny sclerotic density noted the proximal right femur, right femoral series suggested for further evaluation.  IMPRESSION: 1. Degenerative changes lumbar spine and both hips. No acute bony abnormality.  2. Tiny sclerotic density noted the proximal right femur. Right femoral series suggested for further evaluation.   Electronically Signed   By: Marcello Moores  Register   On: 10/17/2017 16:52  --------------------------------------------------------------------  I have personally reviewed the radiology report from 10/18/17 R Femur  CLINICAL DATA:  68 year old male with a history of  EXAM: RIGHT FEMUR 2 VIEWS  COMPARISON:  10/17/2017,  FINDINGS: No acute displaced fracture. Osteoarthritis of the right hip. No focal soft tissue swelling.  Oblique images demonstrate that the calcific density projects posteriorly to the femur, and the interface with the cortex is not well evaluated on this plain film. Size measures approximately 10 mm.  Distal femur unremarkable.  IMPRESSION: No acute bony abnormality.  The calcific density on the plain film projects posterior to the cortex on the oblique images, unchanged from the prior plain film. The interface with the cortex is not well evaluated. This lesion may be arising from the cortex, or alternatively may be separate from the cortex within the soft tissues, and both benign and malignant entities remain on the differential. If the patient has ongoing symptoms of pain, referral for orthopedic evaluation and potentially cross-sectional imaging with CT may be considered.   Electronically Signed   By: Corrie Mckusick D.O.   On: 10/18/2017 21:00.  >>>>>>>>  I have personally reviewed the radiology report from 11/01/17 on R CT Femur.  CLINICAL DATA:  Right thigh pain for the past 2 months. No known injury. Small sclerotic lesion in the proximal right  femur on x-ray.  EXAM: CT OF THE LOWER RIGHT EXTREMITY WITHOUT CONTRAST  TECHNIQUE: Multidetector CT imaging of the right lower extremity was performed according to the standard protocol.  COMPARISON:  Pelvis and right hip x-rays.  Dated October 17, 2017  FINDINGS: Bones/Joint/Cartilage  No acute fracture or dislocation. Mild right hip joint space narrowing superiorly with subchondral sclerosis, similar to prior x-rays. Mild degenerative changes of the pubic symphysis. No joint effusion.  No focal bone lesion. The sclerotic lesion seen on x-ray corresponds to a dystrophic calcification along the insertion of the gluteus maximus muscle just posterior to the proximal subtrochanteric femur, likely related to chronic tendinopathy or old injury.  Ligaments  Suboptimally assessed by CT.  Muscles and Tendons  Unremarkable.  No muscle atrophy.  Soft tissues  Unremarkable.  No soft tissue mass or fluid collection.  IMPRESSION: 1. No focal bone lesion. The sclerotic lesion seen on x-ray corresponds to a dystrophic calcification along the insertion of the gluteus maximus muscle just posterior to the proximal subtrochanteric femur, likely related to chronic tendinopathy or old injury. 2. Mild right hip osteoarthritis.   Electronically Signed   By: Titus Dubin M.D.   On: 11/01/2017 09:11   Results for orders placed or performed in visit on 09/06/17  Comprehensive metabolic panel  Result Value Ref Range   Glucose 91 65 - 99 mg/dL   BUN 23 8 - 27 mg/dL   Creatinine, Ser 0.87 0.76 - 1.27 mg/dL   GFR calc non Af Amer 90 >59 mL/min/1.73   GFR calc Af Amer 104 >59 mL/min/1.73   BUN/Creatinine Ratio 26 (H) 10 - 24   Sodium 142 134 - 144 mmol/L   Potassium 4.4 3.5 - 5.2 mmol/L   Chloride 105 96 - 106 mmol/L   CO2 20 20 - 29 mmol/L   Calcium 9.5 8.6 - 10.2 mg/dL   Total Protein 6.9 6.0 - 8.5 g/dL   Albumin 4.9 (H) 3.6 - 4.8 g/dL   Globulin, Total 2.0 1.5 -  4.5 g/dL   Albumin/Globulin Ratio 2.5 (H) 1.2 - 2.2   Bilirubin Total 0.7 0.0 - 1.2 mg/dL   Alkaline Phosphatase 74 39 - 117 IU/L   AST 41 (H) 0 - 40 IU/L   ALT 40 0 - 44 IU/L  Lipid Panel w/o Chol/HDL Ratio  Result Value Ref Range   Cholesterol, Total 167 100 - 199 mg/dL   Triglycerides 91 0 - 149 mg/dL   HDL 52 >39 mg/dL   VLDL Cholesterol Cal 18 5 - 40 mg/dL   LDL Calculated 97 0 - 99 mg/dL      Assessment & Plan:   Problem List Items Addressed This Visit    Chronic hip pain, bilateral   Relevant Orders   Ambulatory referral to Orthopedic Surgery   Chronic low back pain with bilateral sciatica   Relevant Orders   Ambulatory referral to Orthopedic Surgery   Chronic pain of both knees   Relevant Medications   diclofenac sodium (VOLTAREN) 1 % GEL   Other Relevant Orders   Ambulatory referral to Sports Medicine   Pes cavus   Relevant Orders   Ambulatory referral to Sports Medicine   Primary osteoarthritis involving multiple joints - Primary   Relevant Medications   diclofenac sodium (VOLTAREN) 1 % GEL   Other Relevant Orders   Ambulatory referral to Sports Medicine   Ambulatory referral to Orthopedic Surgery      Chronic osteoarthritis, progressive worsening multiple joints, partial relief on medication management - Seems to have symptoms in different regions, particularly lower extremity knees, lower leg and feet, and also has significant problem with hips and low back w/ sciatica. - no prior surgery  - No red flag symptoms. Negative SLR for radiculopathy  - Improved on topical NSAID voltaren and has already tried max conservative therapy including OTC and rx - Previously seen [facility]. Last X-rays L spine, hips 10/2017 reviewed [facility] - however he did not pursue follow-up with them after limited results Failed Tramadol, Gabapentin, Flexeril 5mg  - ineffective   Plan: 1. Discussion on course of OA/DJD different joints and scope of treatment and referral plan -  Agreement that he is ready for 2nd opinion for  better diagnostic evaluation, and consideration of advanced imaging testing may benefit from MRI as well. - Continue current Meloxicam 15mg  daily PRN - intermittent usage as advised - Re order Diclofenac topical generic - he previously used brand name [place] of state - Continue baclofen PRN - May stop Tylenol since high dose ineffective  For knees, lower ext, feet /gait - Will refer to [facility] in [place] for diagnostic eval - he may benefit from shoe inserts / custom orthotics or gait evaluation and more targeted rehab / PT recommendations - regarding knees also he may benefit from advanced imaging - consider [facility] [place] vs repeat X-ray and future MRI  For low back and hips, - Referral to local [facility], he prefers instead of returning to [place]. - Requesting diagnostic eval, likely update X-rays, targeted or focus physical therapy evaluation and future may benefit from MRI lumbar spine if indicated  Will hold off on repeat X-rays here to avoid duplication of testing.  Future if ultimately not improving or no further therapy offered from these avenues, will consider Physical Medicine & Rehab specialist for more procedural intervention if interested    Meds ordered this encounter  Medications  . diclofenac sodium (VOLTAREN) 1 % GEL    Sig: Apply 2 g topically daily as needed.    Dispense:  100 g    Refill:  3   Orders Placed This Encounter  Procedures  . Ambulatory referral to Sports Medicine    Referral Priority:   Routine    Referral Type:   Consultation    Referred to Provider:   Thurman Coyer, DO    Number of Visits Requested:   1  . Ambulatory referral to Orthopedic Surgery    Referral Priority:   Routine    Referral Type:   Surgical    Referral Reason:   Specialty Services Required    Referred to Provider:   Lovell Sheehan, MD    Requested Specialty:   Orthopedic Surgery    Number of  Visits Requested:   1    Follow up plan: Return in about 5 months (around 08/20/2018) for Yearly Medicare Check up.  Future labs ordered for 08/2018  Nobie Putnam, [facility] 03/21/2018, 11:20 AM

## 2018-03-21 NOTE — Patient Instructions (Addendum)
Thank you for coming to the office today.  Refilled Diclofenac (generic Voltaren gel)  Continue current meloxicam  Let me know if need refills or anything else.  DUE for FASTING BLOOD WORK (no food or drink after midnight before the lab appointment, only water or coffee without cream/sugar on the morning of)  SCHEDULE "Lab Only" visit in the morning at the clinic for lab draw in 5 MONTHS   - Make sure Lab Only appointment is at about 1 week before your next appointment, so that results will be available  For Lab Results, once available within 2-3 days of blood draw, you can can log in to MyChart online to view your results and a brief explanation. Also, we can discuss results at next follow-up visit.   Please schedule a Follow-up Appointment to: Return in about 5 months (around 08/20/2018) for Yearly Medicare Check up.  If you have any other questions or concerns, please feel free to call the office or send a message through [facility]. You may also schedule an earlier appointment if necessary.  Additionally, you may be receiving a survey about your experience at our office within a few days to 1 week by e-mail or mail. We value your feedback.  Nobie Putnam, DO [place]

## 2018-03-21 NOTE — Discharge Instructions (Signed)
[facility] °[facility] °ENDOSCOPIC SINUS SURGERY °[facility] ° °What is Functional Endoscopic Sinus Surgery? ° The Surgery involves making the natural openings of the sinuses larger by removing the bony partitions that separate the sinuses from the nasal cavity.  The natural sinus lining is preserved as much as possible to allow the sinuses to resume normal function after the surgery.  In some patients nasal polyps (excessively swollen lining of the sinuses) may be removed to relieve obstruction of the sinus openings.  The surgery is performed through the nose using lighted scopes, which eliminates the need for incisions on the face.  A septoplasty is a different procedure which is sometimes performed with sinus surgery.  It involves straightening the boy partition that separates the two sides of your nose.  A crooked or deviated septum may need repair if is obstructing the sinuses or nasal airflow.  Turbinate reduction is also often performed during sinus surgery.  The turbinates are bony proturberances from the side walls of the nose which swell and can obstruct the nose in patients with sinus and allergy problems.  Their size can be surgically reduced to help relieve nasal obstruction. ° °What Can Sinus Surgery Do For Me? ° Sinus surgery can reduce the frequency of sinus infections requiring antibiotic treatment.  This can provide improvement in nasal congestion, post-nasal drainage, facial pressure and nasal obstruction.  Surgery will NOT prevent you from ever having an infection again, so it usually only for patients who get infections 4 or more times yearly requiring antibiotics, or for infections that do not clear with antibiotics.  It will not cure nasal allergies, so patients with allergies may still require medication to treat their allergies after surgery. Surgery may improve headaches related to sinusitis, however, some people will continue to  require medication to control sinus headaches related to allergies.  Surgery will do nothing for other forms of headache (migraine, tension or cluster). ° °What Are the Risks of Endoscopic Sinus Surgery? ° Current techniques allow surgery to be performed safely with little risk, however, there are rare complications that patients should be aware of.  Because the sinuses are located around the eyes, there is risk of eye injury, including blindness, though again, this would be quite rare. This is usually a result of bleeding behind the eye during surgery, which puts the vision oat risk, though there are treatments to protect the vision and prevent permanent disrupted by surgery causing a leak of the spinal fluid that surrounds the brain.  More serious complications would include bleeding inside the brain cavity or damage to the brain.  Again, all of these complications are uncommon, and spinal fluid leaks can be safely managed surgically if they occur.  The most common complication of sinus surgery is bleeding from the nose, which may require packing or cauterization of the nose.  Continued sinus have polyps may experience recurrence of the polyps requiring revision surgery.  Alterations of sense of smell or injury to the tear ducts are also rare complications.  ° °What is the Surgery Like, and what is the Recovery? ° The Surgery usually takes a couple of hours to perform, and is usually performed under a general anesthetic (completely asleep).  Patients are usually discharged home after a couple of hours.  Sometimes during surgery it is necessary to pack the nose to control bleeding, and the packing is left in place for 24 - 48 hours, and removed by your surgeon.    If a septoplasty was performed during the procedure, there is often a splint placed which must be removed after 5-7 days.   °Discomfort: Pain is usually mild to moderate, and can be controlled by prescription pain medication or acetaminophen (Tylenol).   Aspirin, Ibuprofen (Advil, Motrin), or Naprosyn (Aleve) should be avoided, as they can cause increased bleeding.  Most patients feel sinus pressure like they have a bad head cold for several days.  Sleeping with your head elevated can help reduce swelling and facial pressure, as can ice packs over the face.  A humidifier may be helpful to keep the mucous and blood from drying in the nose.  ° °Diet: There are no specific diet restrictions, however, you should generally start with clear liquids and a light diet of bland foods because the anesthetic can cause some nausea.  Advance your diet depending on how your stomach feels.  Taking your pain medication with food will often help reduce stomach upset which pain medications can cause. ° °Nasal Saline Irrigation: It is important to remove blood clots and dried mucous from the nose as it is healing.  This is done by having you irrigate the nose at least 3 - 4 times daily with a salt water solution.  We recommend using NeilMed Sinus Rinse (available at the drug store).  Fill the squeeze bottle with the solution, bend over a sink, and insert the tip of the squeeze bottle into the nose ½ of an inch.  Point the tip of the squeeze bottle towards the inside corner of the eye on the same side your irrigating.  Squeeze the bottle and gently irrigate the nose.  If you bend forward as you do this, most of the fluid will flow back out of the nose, instead of down your throat.   The solution should be warm, near body temperature, when you irrigate.   Each time you irrigate, you should use a full squeeze bottle.  ° °Note that if you are instructed to use Nasal Steroid Sprays at any time after your surgery, irrigate with saline BEFORE using the steroid spray, so you do not wash it all out of the nose. °Another product, Nasal Saline Gel (such as AYR Nasal Saline Gel) can be applied in each nostril 3 - 4 times daily to moisture the nose and reduce scabbing or crusting. ° °Bleeding:   Bloody drainage from the nose can be expected for several days, and patients are instructed to irrigate their nose frequently with salt water to help remove mucous and blood clots.  The drainage may be dark red or brown, though some fresh blood may be seen intermittently, especially after irrigation.  Do not blow you nose, as bleeding may occur. If you must sneeze, keep your mouth open to allow air to escape through your mouth. ° °If heavy bleeding occurs: Irrigate the nose with saline to rinse out clots, then spray the nose 3 - 4 times with Afrin Nasal Decongestant Spray.  The spray will constrict the blood vessels to slow bleeding.  Pinch the lower half of your nose shut to apply pressure, and lay down with your head elevated.  Ice packs over the nose may help as well. If bleeding persists despite these measures, you should notify your doctor.  Do not use the Afrin routinely to control nasal congestion after surgery, as it can result in worsening congestion and may affect healing.  ° ° ° °Activity: Return to work varies among patients. Most patients will be   out of work at least 5 - 7 days to recover.  Patient may return to work after they are off of narcotic pain medication, and feeling well enough to perform the functions of their job.  Patients must avoid heavy lifting (over 10 pounds) or strenuous physical for 2 weeks after surgery, so your employer may need to assign you to light duty, or keep you out of work longer if light duty is not possible.  NOTE: you should not drive, operate dangerous machinery, do any mentally demanding tasks or make any important legal or financial decisions while on narcotic pain medication and recovering from the general anesthetic.  °  °Call Your Doctor Immediately if You Have Any of the Following: °1. Bleeding that you cannot control with the above measures °2. Loss of vision, double vision, bulging of the eye or black eyes. °3. Fever over 101 degrees °4. Neck stiffness with  severe headache, fever, nausea and change in mental state. °You are always encourage to call anytime with concerns, however, please call with requests for pain medication refills during office hours. ° °Office Endoscopy: During follow-up visits your doctor will remove any packing or splints that may have been placed and evaluate and clean your sinuses endoscopically.  Topical anesthetic will be used to make this as comfortable as possible, though you may want to take your pain medication prior to the visit.  How often this will need to be done varies from patient to patient.  After complete recovery from the surgery, you may need follow-up endoscopy from time to time, particularly if there is concern of recurrent infection or nasal polyps. ° ° °General Anesthesia, Adult, Care After °This sheet gives you information about how to care for yourself after your procedure. Your health care provider may also give you more specific instructions. If you have problems or questions, contact your health care provider. °What can I expect after the procedure? °After the procedure, the following side effects are common: °· Pain or discomfort at the IV site. °· Nausea. °· Vomiting. °· Sore throat. °· Trouble concentrating. °· Feeling cold or chills. °· Weak or tired. °· Sleepiness and fatigue. °· Soreness and body aches. These side effects can affect parts of the body that were not involved in surgery. °Follow these instructions at home: ° °For at least 24 hours after the procedure: °· Have a responsible adult stay with you. It is important to have someone help care for you until you are awake and alert. °· Rest as needed. °· Do not: °? Participate in activities in which you could fall or become injured. °? Drive. °? Use heavy machinery. °? Drink alcohol. °? Take sleeping pills or medicines that cause drowsiness. °? Make important decisions or sign legal documents. °? Take care of children on your own. °Eating and  drinking °· Follow any instructions from your health care provider about eating or drinking restrictions. °· When you feel hungry, start by eating small amounts of foods that are soft and easy to digest (bland), such as toast. Gradually return to your regular diet. °· Drink enough fluid to keep your urine pale yellow. °· If you vomit, rehydrate by drinking water, juice, or clear broth. °General instructions °· If you have sleep apnea, surgery and certain medicines can increase your risk for breathing problems. Follow instructions from your health care provider about wearing your sleep device: °? Anytime you are sleeping, including during daytime naps. °? While taking prescription pain medicines, sleeping medicines, or medicines   you drowsy. °· Return to your normal activities as told by your health care provider. Ask your health care provider what activities are safe for you. °· Take over-the-counter and prescription medicines only as told by your health care provider. °· If you smoke, do not smoke without supervision. °· Keep all follow-up visits as told by your health care provider. This is important. °Contact a health care provider if: °· You have nausea or vomiting that does not get better with medicine. °· You cannot eat or drink without vomiting. °· You have pain that does not get better with medicine. °· You are unable to pass urine. °· You develop a skin rash. °· You have a fever. °· You have redness around your IV site that gets worse. °Get help right away if: °· You have difficulty breathing. °· You have chest pain. °· You have blood in your urine or stool, or you vomit blood. °Summary °· After the procedure, it is common to have a sore throat or nausea. It is also common to feel tired. °· Have a responsible adult stay with you for the first 24 hours after general anesthesia. It is important to have someone help care for you until you are awake and alert. °· When you feel hungry, start by eating small amounts of foods  that are soft and easy to digest (bland), such as toast. Gradually return to your regular diet. °· Drink enough fluid to keep your urine pale yellow. °· Return to your normal activities as told by your health care provider. Ask your health care provider what activities are safe for you. °This information is not intended to replace advice given to you by your health care provider. Make sure you discuss any questions you have with your health care provider. °Document Released: 06/06/2000 Document Revised: 10/14/2016 Document Reviewed: 10/14/2016 °Elsevier Interactive Patient Education © 2019 [employer] ° °

## 2018-03-22 ENCOUNTER — Ambulatory Visit: Payer: Medicare Other | Admitting: Anesthesiology

## 2018-03-22 ENCOUNTER — Ambulatory Visit
Admission: RE | Admit: 2018-03-22 | Discharge: 2018-03-22 | Disposition: A | Discharge status: Home / Self Care | Payer: Medicare Other | Attending: Otolaryngology | Admitting: Otolaryngology

## 2018-03-22 ENCOUNTER — Encounter
Admission: RE | Disposition: A | Discharge status: Home / Self Care | Payer: Self-pay | Source: Home / Self Care | Attending: Otolaryngology

## 2018-03-22 DIAGNOSIS — F419 Anxiety disorder, unspecified: Secondary | ICD-10-CM | POA: Insufficient documentation

## 2018-03-22 DIAGNOSIS — H6982 Other specified disorders of Eustachian tube, left ear: Secondary | ICD-10-CM | POA: Diagnosis not present

## 2018-03-22 DIAGNOSIS — F329 Major depressive disorder, single episode, unspecified: Secondary | ICD-10-CM | POA: Diagnosis not present

## 2018-03-22 DIAGNOSIS — Q180 Sinus, fistula and cyst of branchial cleft: Secondary | ICD-10-CM | POA: Diagnosis not present

## 2018-03-22 DIAGNOSIS — M950 Acquired deformity of nose: Secondary | ICD-10-CM | POA: Insufficient documentation

## 2018-03-22 DIAGNOSIS — Z79899 Other long term (current) drug therapy: Secondary | ICD-10-CM | POA: Insufficient documentation

## 2018-03-22 DIAGNOSIS — Z8673 Personal history of transient ischemic attack (TIA), and cerebral infarction without residual deficits: Secondary | ICD-10-CM | POA: Insufficient documentation

## 2018-03-22 DIAGNOSIS — Z7951 Long term (current) use of inhaled steroids: Secondary | ICD-10-CM | POA: Insufficient documentation

## 2018-03-22 DIAGNOSIS — J343 Hypertrophy of nasal turbinates: Secondary | ICD-10-CM | POA: Insufficient documentation

## 2018-03-22 DIAGNOSIS — J392 Other diseases of pharynx: Secondary | ICD-10-CM | POA: Insufficient documentation

## 2018-03-22 DIAGNOSIS — J342 Deviated nasal septum: Secondary | ICD-10-CM | POA: Diagnosis not present

## 2018-03-22 DIAGNOSIS — K219 Gastro-esophageal reflux disease without esophagitis: Secondary | ICD-10-CM | POA: Insufficient documentation

## 2018-03-22 DIAGNOSIS — J3489 Other specified disorders of nose and nasal sinuses: Secondary | ICD-10-CM | POA: Diagnosis not present

## 2018-03-22 DIAGNOSIS — H6983 Other specified disorders of Eustachian tube, bilateral: Secondary | ICD-10-CM | POA: Diagnosis not present

## 2018-03-22 HISTORY — DX: Unspecified osteoarthritis, unspecified site: M19.90

## 2018-03-22 HISTORY — PX: NASAL ENDOSCOPY: SHX6577

## 2018-03-22 HISTORY — DX: Supraventricular tachycardia: I47.1

## 2018-03-22 HISTORY — DX: Supraventricular tachycardia, unspecified: I47.10

## 2018-03-22 HISTORY — DX: Transient cerebral ischemic attack, unspecified: G45.9

## 2018-03-22 HISTORY — PX: NASAL SEPTOPLASTY W/ TURBINOPLASTY: SHX2070

## 2018-03-22 HISTORY — DX: Gastro-esophageal reflux disease without esophagitis: K21.9

## 2018-03-22 HISTORY — PX: NASOPHARYNGOSCOPY EUSTATION TUBE BALLOON DILATION: SHX6729

## 2018-03-22 HISTORY — PX: EXCISION NASAL MASS: SHX6271

## 2018-03-22 SURGERY — SEPTOPLASTY, NOSE, WITH NASAL TURBINATE REDUCTION
Anesthesia: General | Site: Nose | Laterality: Left

## 2018-03-22 MED ORDER — LACTATED RINGERS IV SOLN
10.0000 mL/h | INTRAVENOUS | Status: DC
  Administered 2018-03-22: 10 mL/h via INTRAVENOUS

## 2018-03-22 MED ORDER — FENTANYL CITRATE (PF) 100 MCG/2ML IJ SOLN
INTRAMUSCULAR | Status: DC | PRN
  Administered 2018-03-22: 100 ug via INTRAVENOUS

## 2018-03-22 MED ORDER — OXYCODONE HCL 5 MG PO TABS: 5.0000 mg | ORAL_TABLET | Freq: Once | ORAL | Status: DC | PRN

## 2018-03-22 MED ORDER — OXYMETAZOLINE HCL 0.05 % NA SOLN: 2.0000 | Freq: Once | NASAL | Status: DC

## 2018-03-22 MED ORDER — FENTANYL CITRATE (PF) 100 MCG/2ML IJ SOLN: 25.0000 ug | INTRAMUSCULAR | Status: DC | PRN

## 2018-03-22 MED ORDER — EPHEDRINE SULFATE 50 MG/ML IJ SOLN
INTRAMUSCULAR | Status: DC | PRN
  Administered 2018-03-22 (×3): 5 mg via INTRAVENOUS

## 2018-03-22 MED ORDER — DEXAMETHASONE SODIUM PHOSPHATE 4 MG/ML IJ SOLN
INTRAMUSCULAR | Status: DC | PRN
  Administered 2018-03-22: 10 mg via INTRAVENOUS

## 2018-03-22 MED ORDER — ROCURONIUM BROMIDE 100 MG/10ML IV SOLN
INTRAVENOUS | Status: DC | PRN
  Administered 2018-03-22: 25 mg via INTRAVENOUS

## 2018-03-22 MED ORDER — ACETAMINOPHEN 160 MG/5ML PO SOLN: 325.0000 mg | ORAL | Status: DC | PRN

## 2018-03-22 MED ORDER — MIDAZOLAM HCL 5 MG/5ML IJ SOLN
INTRAMUSCULAR | Status: DC | PRN
  Administered 2018-03-22: 2 mg via INTRAVENOUS

## 2018-03-22 MED ORDER — ACETAMINOPHEN 10 MG/ML IV SOLN
1000.0000 mg | Freq: Once | INTRAVENOUS | Status: AC
  Administered 2018-03-22: 1000 mg via INTRAVENOUS

## 2018-03-22 MED ORDER — PHENYLEPHRINE HCL 0.5 % NA SOLN
NASAL | Status: DC | PRN
  Administered 2018-03-22: 08:00:00 via TOPICAL

## 2018-03-22 MED ORDER — OXYCODONE HCL 5 MG/5ML PO SOLN: 5.0000 mg | Freq: Once | ORAL | Status: DC | PRN

## 2018-03-22 MED ORDER — GLYCOPYRROLATE 0.2 MG/ML IJ SOLN
INTRAMUSCULAR | Status: DC | PRN
  Administered 2018-03-22: 0.1 mg via INTRAVENOUS

## 2018-03-22 MED ORDER — DEXTROSE 5 % IV SOLN
2000.0000 mg | Freq: Once | INTRAVENOUS | Status: AC
  Administered 2018-03-22: 2000 mg via INTRAVENOUS

## 2018-03-22 MED ORDER — HYDROCODONE-ACETAMINOPHEN 5-325 MG PO TABS: 1.0000 | ORAL_TABLET | Freq: Four times a day (QID) | ORAL | 0 refills | Status: AC | PRN

## 2018-03-22 MED ORDER — ACETAMINOPHEN 325 MG PO TABS: 325.0000 mg | ORAL_TABLET | ORAL | Status: DC | PRN

## 2018-03-22 MED ORDER — CEPHALEXIN 500 MG PO CAPS: 500.0000 mg | ORAL_CAPSULE | Freq: Two times a day (BID) | ORAL | 0 refills | Status: DC

## 2018-03-22 MED ORDER — LIDOCAINE-EPINEPHRINE 1 %-1:100000 IJ SOLN
INTRAMUSCULAR | Status: DC | PRN
  Administered 2018-03-22: 7 mL

## 2018-03-22 MED ORDER — ONDANSETRON HCL 4 MG/2ML IJ SOLN
INTRAMUSCULAR | Status: DC | PRN
  Administered 2018-03-22: 4 mg via INTRAVENOUS

## 2018-03-22 MED ORDER — PROPOFOL 10 MG/ML IV BOLUS
INTRAVENOUS | Status: DC | PRN
  Administered 2018-03-22: 150 mg via INTRAVENOUS

## 2018-03-22 MED ORDER — PREDNISONE 10 MG PO TABS: ORAL_TABLET | ORAL | 0 refills | Status: DC

## 2018-03-22 MED ORDER — LIDOCAINE HCL (CARDIAC) PF 100 MG/5ML IV SOSY
PREFILLED_SYRINGE | INTRAVENOUS | Status: DC | PRN
  Administered 2018-03-22: 40 mg via INTRAVENOUS

## 2018-03-22 SURGICAL SUPPLY — 40 items
BALLN CATH EUST TUBE 6X16 (BALLOONS) ×4
CANISTER SUCT 1200ML W/VALVE (MISCELLANEOUS) ×4 IMPLANT
CATH BALLOON EUST TUBE 6X16 (BALLOONS) ×2 IMPLANT
CATH IV 18X1 1/4 SAFELET (CATHETERS) ×2 IMPLANT
COAGULATOR SUCT 8FR VV (MISCELLANEOUS) ×4 IMPLANT
DEVICE INFLATION SEID (MISCELLANEOUS) ×4 IMPLANT
DRAPE HEAD BAR (DRAPES) ×4 IMPLANT
ELECT REM PT RETURN 9FT ADLT (ELECTROSURGICAL) ×4
ELECTRODE REM PT RTRN 9FT ADLT (ELECTROSURGICAL) ×2 IMPLANT
GLOVE PI ULTRA LF STRL 7.5 (GLOVE) ×4 IMPLANT
GLOVE PI ULTRA NON LATEX 7.5 (GLOVE) ×4
GOWN STRL REUS W/ TWL LRG LVL3 (GOWN DISPOSABLE) ×2 IMPLANT
GOWN STRL REUS W/TWL LRG LVL3 (GOWN DISPOSABLE) ×2
IV CATH 18X1 1/4 SAFELET (CATHETERS) ×2
KIT TURNOVER KIT A (KITS) ×4 IMPLANT
NDL ANESTHESIA 27G X 3.5 (NEEDLE) ×2 IMPLANT
NDL HYPO 25GX1X1/2 BEV (NEEDLE) ×2 IMPLANT
NDL HYPO 27GX1-1/4 (NEEDLE) ×2 IMPLANT
NEEDLE ANESTHESIA  27G X 3.5 (NEEDLE) ×2
NEEDLE ANESTHESIA 27G X 3.5 (NEEDLE) ×2 IMPLANT
NEEDLE HYPO 25GX1X1/2 BEV (NEEDLE) ×4 IMPLANT
NEEDLE HYPO 27GX1-1/4 (NEEDLE) ×4 IMPLANT
NS IRRIG 500ML POUR BTL (IV SOLUTION) ×4 IMPLANT
PACK ENT CUSTOM (PACKS) ×4 IMPLANT
PACKING NASAL EPIS 4X2.4 XEROG (MISCELLANEOUS) ×2 IMPLANT
PATTIES SURGICAL .5 X3 (DISPOSABLE) ×4 IMPLANT
SOL ANTI-FOG 6CC FOG-OUT (MISCELLANEOUS) ×2 IMPLANT
SOL FOG-OUT ANTI-FOG 6CC (MISCELLANEOUS) ×2
SPLINT NASAL SEPTAL BLV .50 ST (MISCELLANEOUS) ×4 IMPLANT
STRAP BODY AND KNEE 60X3 (MISCELLANEOUS) ×4 IMPLANT
STYLUS VIVAER (MISCELLANEOUS) ×2
STYLUS VIVAER BP ELECT (MISCELLANEOUS) IMPLANT
SUT CHROMIC 3-0 (SUTURE) ×2
SUT CHROMIC 3-0 KS 27XMFL CR (SUTURE) ×2
SUT ETHILON 3-0 KS 30 BLK (SUTURE) ×4 IMPLANT
SUT PLAIN GUT 4-0 (SUTURE) ×4 IMPLANT
SUTURE CHRMC 3-0 KS 27XMFL CR (SUTURE) ×2 IMPLANT
SYR 3ML LL SCALE MARK (SYRINGE) ×4 IMPLANT
TOWEL OR 17X26 4PK STRL BLUE (TOWEL DISPOSABLE) ×4 IMPLANT
WATER STERILE IRR 250ML POUR (IV SOLUTION) ×4 IMPLANT

## 2018-03-22 NOTE — Anesthesia Postprocedure Evaluation (Signed)
Anesthesia Post Note  Patient: Matthew Maldonado  Procedure(s) Performed: NASAL SEPTOPLASTY WITH INFERIORTURBINATE REDUCTION (Bilateral Nose) NASAL VALVE REPAIR COLLAPSE (Bilateral Nose) NASOPHARYNGOSCOPY EUSTATION TUBE BALLOON DILATION (Bilateral Nose) NASAL ENDOSCOPY NASOPHARYNGESCOPY (Bilateral Nose) EXCISION nasopharyngeal lesion (Left Nose)  Patient location during evaluation: PACU Anesthesia Type: General Level of consciousness: awake Pain management: pain level controlled Vital Signs Assessment: post-procedure vital signs reviewed and stable Respiratory status: respiratory function stable Cardiovascular status: stable Postop Assessment: no signs of nausea or vomiting Anesthetic complications: no    Veda Canning

## 2018-03-22 NOTE — Anesthesia Procedure Notes (Signed)
Procedure Name: Intubation Date/Time: 03/22/2018 7:40 AM Performed by: Mayme Genta, CRNA Pre-anesthesia Checklist: Patient identified, Emergency Drugs available, Suction available, Patient being monitored and Timeout performed Patient Re-evaluated:Patient Re-evaluated prior to induction Oxygen Delivery Method: Circle system utilized Preoxygenation: Pre-oxygenation with 100% oxygen Induction Type: IV induction Ventilation: Mask ventilation without difficulty Laryngoscope Size: Miller and 3 Grade View: Grade II Tube type: Oral Rae Tube size: 7.5 mm Number of attempts: 1 Placement Confirmation: ETT inserted through vocal cords under direct vision,  positive ETCO2 and breath sounds checked- equal and bilateral Tube secured with: Tape Dental Injury: Teeth and Oropharynx as per pre-operative assessment

## 2018-03-22 NOTE — H&P (Signed)
H&P has been reviewed and patient reevaluated, no changes necessary. To be downloaded later.  

## 2018-03-22 NOTE — Transfer of Care (Signed)
Immediate Anesthesia Transfer of Care Note  Patient: Matthew Maldonado  Procedure(s) Performed: NASAL SEPTOPLASTY WITH INFERIORTURBINATE REDUCTION (Bilateral Nose) NASAL VALVE REPAIR COLLAPSE (Bilateral Nose) NASOPHARYNGOSCOPY EUSTATION TUBE BALLOON DILATION (Bilateral Nose) NASAL ENDOSCOPY NASOPHARYNGESCOPY (Bilateral Nose) EXCISION nasopharyngeal lesion (Left Nose)  Patient Location: PACU  Anesthesia Type: General  Level of Consciousness: awake, alert  and patient cooperative  Airway and Oxygen Therapy: Patient Spontanous Breathing and Patient connected to supplemental oxygen  Post-op Assessment: Post-op Vital signs reviewed, Patient's Cardiovascular Status Stable, Respiratory Function Stable, Patent Airway and No signs of Nausea or vomiting  Post-op Vital Signs: Reviewed and stable  Complications: No apparent anesthesia complications

## 2018-03-22 NOTE — Anesthesia Preprocedure Evaluation (Signed)
Anesthesia Evaluation  Patient identified by MRN, date of birth, ID band Patient awake    Reviewed: Allergy & Precautions, NPO status , Patient's Chart, lab work & pertinent test results  Airway Mallampati: II  TM Distance: >3 FB    Comment: [facility]   Pulmonary Current Smoker,    breath sounds clear to auscultation       Cardiovascular + dysrhythmias (PSVT - managed with metoprolol)  Rhythm:Regular Rate:Normal  Normal TTE and stress test 2017  Takes atorvastatin for stroke prevention   Neuro/Psych PSYCHIATRIC DISORDERS Anxiety Depression Brain MRI 2017: No acute intracranial abnormality. 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 Vessel wall imaging may be considered if there is clinical suspicion for a vasculitis.  Stenosed vessel above is not operable and stroke prevention has been managed with atorvastatin and aspirin TIA   GI/Hepatic GERD  Medicated,  Endo/Other  BMI 30  Renal/GU Renal disease (minimal change disease)     Musculoskeletal  (+) Arthritis ,   Abdominal   Peds  Hematology   Anesthesia Other Findings   Reproductive/Obstetrics                             Anesthesia Physical Anesthesia Plan  ASA: III  Anesthesia Plan: General   Post-op Pain Management:    Induction: Intravenous  PONV Risk Score and Plan: 1 and Dexamethasone and Ondansetron  Airway Management Planned: Oral ETT  Additional Equipment:   Intra-op Plan:   Post-operative Plan:   Informed Consent: I have reviewed the patients History and Physical, chart, labs and discussed the procedure including the risks, benefits and alternatives for the proposed anesthesia with the patient or authorized representative who has indicated his/her understanding and acceptance.   Dental advisory given  Plan Discussed with: CRNA  Anesthesia Plan Comments:         Anesthesia Quick Evaluation

## 2018-03-22 NOTE — Op Note (Signed)
03/22/2018  9:18 AM  [id number]   Pre-Op Dx:  Deviated Nasal Septum, Hypertrophic Inferior Turbinates, nasal valve collapse and narrowing, eustachian tube dysfunction, left nasopharyngeal cysts  Post-op Dx: Same  Proc: Nasal Septoplasty, Bilateral Partial Reduction Inferior Turbinates, bilateral nasal valve repair using Vivaer, nasopharyngoscopy with excision of the left nasopharyngeal cysts , bilateral eustachian tube balloon dilation  Surg:  Matthew Alas Tanayia Wahlquist  Anes:  GOT  EBL: 100 mL  Comp: None  Findings: 2 watery cysts in the left posterior nasopharyngeal area.  One was filled with clear mucus and the other with more mucoid mucus tissue was sent for permanent section.  The eustachian tube tracts were a little small and narrow were dilated with the balloon catheter.  The septum was deviated to the left superiorly with the ethmoid plate pinching the left middle turbinate inferiorly there was a spur to the right side at the vomer.  The nasal valves were inward and affecting nasal airflow continuously. \  Procedure: With the patient in a comfortable supine position,  general orotracheal anesthesia was induced without difficulty.     The patient received preoperative Afrin spray for topical decongestion and vasoconstriction.  Intravenous prophylactic antibiotics were administered.  At an appropriate level, the patient was placed in a semi-sitting position.  Nasal vibrissae were trimmed.   1% Xylocaine with 1:100,000 epinephrine, 7 cc's, was infiltrated into the anterior floor of the nose, into the nasal spine region, into the membranous columella, and finally into the submucoperichondrial plane of the septum on both sides.  Several minutes were allowed for this to take effect.  Cottoniod pledgetts soaked in Afrin and 4% Xylocaine were placed into both nasal cavities and left while the patient was prepped and draped in the standard fashion.  The materials were removed from the nose and observed  to be intact and correct in number.  The nose was inspected with a headlight and zero degree scope with the findings as described above.  A left Killian incision was sharply executed and carried down to the quadrangular cartilage. The mucoperichondrium was elelvated along the quadrangular plate back to the bony-cartilaginous junction. The mucoperiostium was then elevated along the ethmoid plate and the vomer. The boney-catilaginous junction was then split with a freer elevator and the mucoperiosteum was elevated on the opposite side. The mucoperiosteum was then elevated along the maxillary crest as needed to expose the crooked bone of the crest.  Boney spurs of the vomer and maxillary crest were removed with Donavan Foil forceps.  The cartilaginous plate was trimmed along its posterior and inferior borders of about 2 mm of cartilage to free it up inferiorly. Some of the deviated ethmoid plate was then fractured and removed with Takahashi forceps to free up the posterior border of the quadrangular plate and allow it to swing back to the midline. The mucosal flaps were placed back into their anatomic position to allow visualization of the airways. The septum now sat in the midline with an improved airway.  A 3-0 Chromic suture on a Usher needle in used to anchor the inferior septum at the nasal spine with a through and through suture. The mucosal flaps are then sutured together using a through and through whip stitch of 4-0 Plain Gut with a mini-Jimmey needle. This was used to close the Washburn incision as well.   The inferior turbinates were then inspected. An incision was created along the inferior aspect of the left inferior turbinate with removal of some of the inferior soft  tissue and bone. Electrocautery was used to control bleeding in the area. The remaining turbinate was then outfractured to open up the airway further. There was no significant bleeding noted. The right turbinate was then trimmed and  outfractured in a similar fashion.    The 0 degrees scope was now placed through the left nostril all the way into the nasopharynx.  There were 2 cysts on the left lateral nasopharyngeal wall.  There is little bit of lymph tissue around them.  There is no sign of exudate or ulceration, or infection.  Using a 3 biting forceps the cysts were incised.  One had mostly clear fluid and the other had more mucoid fluid.  Several bites of tissue were taken from the area and then electrocautery was used to help control bleeding on the left nasopharynx.  With the 0 and 30 degrees scope into the left nostril eustachian tube opening was identified.  The Acclarent eustachian tube balloon was then passed through the left nostril and the balloon was inserted into the eustachian tube opening.  Was passed up as far as it would go.  There is about 8 mm of balloon left outside the eustachian tube.  The balloon was dilated to 8 cm of pressure and left there for 2-1/2 minutes.  The balloon was then deflated and the catheter was removed.  The right side was then visualized with the 0 and 30 degrees scope.  Begin the eustachian tube opening was located and the balloon catheter was passed up into the eustachian tube under direct vision.  This also was little bit short and there was about 8 mm of the balloon sticking out the end of the eustachian tube.  It was dilated to 8 cm of pressure and left for 2-1/2 minutes as well.  The balloon was then deflated and the catheter removed.  There is no bleeding from either side at the eustachian tube.  The nasal valves on both sides anteriorly were injected with about a millimeter of 1% Xylocaine with epi 1: 100,000.  Once this was completed the Vivaer handpiece was set up and was used for remodeling of the nasal valve.  The handpiece was lubricated with gel and then placed into the midportion of the nasal valve on the left side.  Gentle outward traction was placed and then the foot pedal was  activated to give 18 seconds of power and 12 seconds of cool down.  This was repeated 2 more times on the left side covering 3 different spots of the nasal valve.  This showed immediate improvement of the nasal valve being wider on the left.  This procedure was then repeated on the right side again lubricating the handpiece and then placing it on the nasal valve with gentle outward traction and activating the power.  This gave 18 seconds of power and 12 seconds of cool down.  This was repeated 2 more times in the lateral wall at the nasal valve.  Again there was good opening of the nasal valve that can be seen postop.  The airways were then visualized and showed open passageways on both sides that were significantly improved compared to before surgery. There was no signifcant bleeding. Nasal splints were applied to both sides of the septum using Xomed 0.55mm regular sized splints that were trimmed, and then held in position with a 3-0 Nylon through and through suture.  The 0 degrees scope was then used to visualize the middle meatal eye.  The left middle turbinate  was very lateralized.  A Valora Corporal was used to medialized some and some xerogel was placed in the middle meatus.  This was done on the right side as well..  The patient was turned back over to anesthesia, and awakened, extubated, and taken to the PACU in satisfactory condition.  Dispo:   PACU to home  Plan: Ice, elevation, narcotic analgesia, steroid taper, and prophylactic antibiotics for the duration of indwelling nasal foreign bodies.  We will reevaluate the patient in the office in 6 days and remove the septal splints.  Return to work in 10 days, strenuous activities in two weeks.   Matthew Maldonado 03/22/2018 9:18 AM

## 2018-03-23 ENCOUNTER — Encounter: Payer: Self-pay | Admitting: Otolaryngology

## 2018-03-26 LAB — SURGICAL PATHOLOGY

## 2018-03-27 DIAGNOSIS — H2513 Age-related nuclear cataract, bilateral: Secondary | ICD-10-CM | POA: Diagnosis not present

## 2018-03-27 DIAGNOSIS — Z48813 Encounter for surgical aftercare following surgery on the respiratory system: Secondary | ICD-10-CM | POA: Diagnosis not present

## 2018-03-27 DIAGNOSIS — H43811 Vitreous degeneration, right eye: Secondary | ICD-10-CM | POA: Diagnosis not present

## 2018-03-30 ENCOUNTER — Ambulatory Visit (INDEPENDENT_AMBULATORY_CARE_PROVIDER_SITE_OTHER): Payer: Medicare Other | Admitting: Sports Medicine

## 2018-03-30 VITALS — BP 130/82 | Ht 69.0 in | Wt 200.0 lb

## 2018-03-30 DIAGNOSIS — M160 Bilateral primary osteoarthritis of hip: Secondary | ICD-10-CM | POA: Diagnosis not present

## 2018-03-30 DIAGNOSIS — Q667 Congenital pes cavus, unspecified foot: Secondary | ICD-10-CM

## 2018-03-30 NOTE — Progress Notes (Signed)
   Subjective:    Patient ID: Matthew Maldonado, male    DOB: [birth year], 68 y.o.   MRN: [id number]  HPI chief complaint: Bilateral hip and lower leg pain  Very pleasant 68 year old male comes in today at the request of his PCP for evaluation of bilateral lower extremity pain.  He has a history of bilateral hip DJD.  This is a chronic issue for him.  While visiting [place] recently he was evaluated at a shoe store and was told that he has a very high arch.  Orthotics were made for some new tennis shoes but the patient has found that when he wears these inserts he gets diffuse pain along the lateral aspect of both lower legs.  He was hoping that those inserts would help his hip pain which is diffuse with occasional radiating pain into the femur.  He is currently on baclofen as well as meloxicam for his hip DJD which has been helpful.  Main reason for today's visit is to see if there is something additional we can do for his feet.  Past medical history reviewed Medications reviewed Allergies reviewed   Review of Systems As above    Objective:   Physical Exam  Well-developed, well-nourished.  No acute distress.  Awake alert and oriented x3.  Vital signs reviewed  Examination of both hips shows severely limited passive internal and external rotation bilaterally.  He does have reproducible hip pain with this.  Evaluation of his gait shows no limp but slight external rotation of both feet.  Examination of his feet show a rigid cavus foot.  No callus buildup across the metatarsal heads.  No soft tissue swelling.  Fairly neutral gait when walking barefoot. Good pulses.  X-rays of both hips show bone-on-bone DJD in the superiormost aspect of the joint.      Assessment & Plan:   Bilateral hip pain secondary to advanced hip DJD Rigid cavus foot  In evaluating his orthotics, which were made in [place], it appears to me that there may be too much medial buildup.  This may throw him into supination  when walking and would therefore put undue stress on the lateral compartment in both lower legs.  Although he certainly needs arch support for his cavus foot, I think the current arch support he has is a little too much.  I am going to start with a green sports insole and a scaphoid pad which will not give him a lot of arch support but will hopefully resolve his lateral lower leg pain.  He will follow-up with me in 3 to 4 weeks for reevaluation.  If his symptoms have improved, then I would consider a new custom orthotic that does not have quite the medial buildup that his current custom orthotics have. For his hip DJD, he will continue to manage this with medication currently.  Although cortisone injections may be of a temporary benefit to him, definitive treatment for both hips is total hip arthroplasty.

## 2018-04-04 DIAGNOSIS — Z48813 Encounter for surgical aftercare following surgery on the respiratory system: Secondary | ICD-10-CM | POA: Diagnosis not present

## 2018-04-14 ENCOUNTER — Other Ambulatory Visit: Payer: Self-pay | Admitting: Family Medicine

## 2018-04-14 DIAGNOSIS — F338 Other recurrent depressive disorders: Secondary | ICD-10-CM

## 2018-04-14 DIAGNOSIS — F411 Generalized anxiety disorder: Secondary | ICD-10-CM

## 2018-04-16 NOTE — Telephone Encounter (Signed)
Requested medication (s) are due for refill today: yes  Requested medication (s) are on the active medication list: yes  Last refill:  09/06/2017 for 180 and 1 refill  Future visit scheduled: no  Notes to clinic:  Non-controlled passed  Requested Prescriptions  Pending Prescriptions Disp Refills   busPIRone (BUSPAR) 15 MG tablet [Pharmacy Med Name: BUSPIRONE HCL 15 MG TAB] 180 tablet 1    Sig: TAKE 1 TABLET BY MOUTH TWICE DAILY     Psychiatry: Anxiolytics/Hypnotics - Non-controlled Passed - 04/14/2018 12:30 PM      Passed - Valid encounter within last 6 months    Recent Outpatient Visits          3 months ago Acute swimmer's ear of right side   [facility] [place], Jeannette How, MD   5 months ago Pain of both hip joints   [facility] [place], Adriana M, PA-C   6 months ago Acute bilateral low back pain with bilateral sciatica   [place], [place], [place]   7 months ago Mixed hyperlipidemia   [facility] [place], [place], DO   11 months ago Acute non-recurrent maxillary sinusitis   [facility] [place], Barb Merino, DO      Future Appointments            In 2 weeks [place], Gordan Payment [place]

## 2018-04-24 ENCOUNTER — Encounter: Payer: Medicare Other | Admitting: Sports Medicine

## 2018-04-25 DIAGNOSIS — Z48813 Encounter for surgical aftercare following surgery on the respiratory system: Secondary | ICD-10-CM | POA: Diagnosis not present

## 2018-04-26 DIAGNOSIS — H903 Sensorineural hearing loss, bilateral: Secondary | ICD-10-CM | POA: Diagnosis not present

## 2018-05-01 NOTE — Progress Notes (Incomplete)
05/02/2018 11:47 AM   [name] 07-26-1950 [id number]  Referring provider: Valerie Roys, DO [place], [place] [id number]  No chief complaint on file.  HPI: Matthew Maldonado is a 68 y.o. male Caucasian with BPH with LU TS, nocturia and ED who presents today for an annual follow up.  ***  BPH WITH LUTS  (prostate and/or bladder) IPSS score: *** PVR: ***   Previous score: 17/2  Previous PVR: 30 mL  Major complaint(s):  x *** years. Denies any dysuria, hematuria or suprapubic pain.   Currently taking: *** Myrbetriq 25 mg daily, but has found it cost prohinitive.  His has had ***.   Denies any recent fevers, chills, nausea or vomiting.  He does not have a family history of PCa.    Score:  1-7 Mild 8-19 Moderate 20-35 Severe   Erectile dysfunction His SHIM score is ***, which is ***.   His previous SHIM score was 11.  He has been having difficulty with erections for the last several years.   His major complaint is lack of firmness with erections.  His libido is *** preserved.   His risk factors for ED are age, BPH, HLD, anxiety, and antidepressants. ***  He denies any painful erections or curvatures with his erections.   He is {still having}{no longer having} spontaneous erections.  He has tried Cialis in the past, but he found it cost prohibitive.  On his 05/01/2017 visit, he reported that he and his wife were in counseling.     Score: 1-7 Severe ED 8-11 Moderate ED 12-16 Mild-Moderate ED 17-21 Mild ED 22-25 No ED      ***  Nocturia Patient has complained of getting up 2 to 3 times nightly.  He found the Myrbetriq very effective.  PMH: Past Medical History:  Diagnosis Date   Anxiety    Arthritis    hand, knees, hip   Blood vessel disorder    Constricted, not able to be operated on in back of brain.    BPH (benign prostatic hyperplasia)    GERD (gastroesophageal reflux disease)    Insomnia    Minimal change disease    no issues for last  4-5 yrs   Nocturia    PSVT (paroxysmal supraventricular tachycardia) (HCC)    TIA (transient ischemic attack)    Sees neurology at [facility] - last MRI 2017 showing stenosed M1    Surgical History: Past Surgical History:  Procedure Laterality Date   EXCISION NASAL MASS Left 03/22/2018   Procedure: EXCISION nasopharyngeal lesion;  Surgeon: Margaretha Sheffield, MD;  Location: [place];  Service: ENT;  Laterality: Left;   NASAL ENDOSCOPY Bilateral 03/22/2018   Procedure: NASAL ENDOSCOPY NASOPHARYNGESCOPY;  Surgeon: Margaretha Sheffield, MD;  Location: [place];  Service: ENT;  Laterality: Bilateral;   NASAL SEPTOPLASTY W/ TURBINOPLASTY Bilateral 03/22/2018   Procedure: NASAL SEPTOPLASTY WITH INFERIORTURBINATE REDUCTION;  Surgeon: Margaretha Sheffield, MD;  Location: [place];  Service: ENT;  Laterality: Bilateral;   NASOPHARYNGOSCOPY EUSTATION TUBE BALLOON DILATION Bilateral 03/22/2018   Procedure: NASOPHARYNGOSCOPY EUSTATION TUBE BALLOON DILATION;  Surgeon: Margaretha Sheffield, MD;  Location: [place];  Service: ENT;  Laterality: Bilateral;   RENAL BIOPSY      Home Medications:  Allergies as of 05/02/2018   No Known Allergies     Medication List       Accurate as of May 01, 2018 11:47 AM. Always use your most recent med list.  atorvastatin 80 MG tablet Commonly known as:  LIPITOR Take 1 tablet (80 mg total) by mouth daily at 6 PM.   baclofen 10 MG tablet Commonly known as:  LIORESAL Take 0.5-1 tablets (5-10 mg total) by mouth 3 (three) times daily as needed for muscle spasms.   busPIRone 15 MG tablet Commonly known as:  BUSPAR TAKE 1 TABLET BY MOUTH TWICE DAILY   cephALEXin 500 MG capsule Commonly known as:  KEFLEX Take 1 capsule (500 mg total) by mouth 2 (two) times daily.   diclofenac sodium 1 % Gel Commonly known as:  VOLTAREN Apply 2 g topically daily as needed.   esomeprazole 20 MG capsule Commonly known as:  NEXIUM Take 1 capsule (20 mg  total) by mouth daily at 12 noon.   fluticasone 50 MCG/ACT nasal spray Commonly known as:  FLONASE Place 2 sprays into both nostrils daily.   hydrOXYzine 25 MG capsule Commonly known as:  VISTARIL Take 1-3 capsules (25-75 mg total) by mouth at bedtime as needed (for svere anxiety sx and sleep).   ketoconazole 2 % shampoo Commonly known as:  NIZORAL APPLY TOPICALLY AS NEEDED IRRITATION TO SCALP 1-2 TIMES A WEEK   meloxicam 15 MG tablet Commonly known as:  MOBIC Take 1 tablet (15 mg total) by mouth daily as needed for pain. For up to 1 week at a time   metoprolol succinate 25 MG 24 hr tablet Commonly known as:  TOPROL-XL Take 1 tablet (25 mg total) by mouth daily.   multivitamin with minerals tablet Take 1 tablet by mouth daily.   predniSONE 10 MG tablet Commonly known as:  DELTASONE Start with 3 pills tomorrow. Taper over the next 6 days.  3,3,2,2,1,1.   triamcinolone ointment 0.5 % Commonly known as:  KENALOG Apply 1 application topically 2 (two) times daily.   venlafaxine XR 75 MG 24 hr capsule Commonly known as:  EFFEXOR XR Take 1 capsule (75 mg total) by mouth daily with breakfast.       Allergies: No Known Allergies  Family History: Family History  Problem Relation Age of Onset   Osteoporosis Mother    Heart disease Father    Heart disease Brother    Alcohol abuse Brother    Kidney disease Neg Hx    Prostate cancer Neg Hx     Social History:  reports that he has been smoking cigars. He has smoked for the past 4.00 years. He uses smokeless tobacco. He reports current alcohol use of about 18.0 standard drinks of alcohol per week. He reports that he does not use drugs.  ROS:                                        Physical Exam: There were no vitals taken for this visit.  Constitutional:  Well nourished. Alert and oriented, No acute distress. {HEENT: [place] AT, moist mucus membranes.  Trachea midline, no masses.} Cardiovascular:  No clubbing, cyanosis, or edema. Respiratory: Normal respiratory effort, no increased work of breathing. {GI: Abdomen is soft, non tender, non distended, no abdominal masses. Liver and spleen not palpable.  No hernias appreciated.  Stool sample for occult testing is not indicated.} {GU: No CVA tenderness.  No bladder fullness or masses.  Patient with circumcised/uncircumcised phallus. ***Foreskin easily retracted***  Urethral meatus is patent.  No penile discharge. No penile lesions or rashes. Scrotum without lesions, cysts, rashes and/or  edema.  Testicles are located scrotally bilaterally. No masses are appreciated in the testicles. Left and right epididymis are normal.} {Rectal: Patient with normal sphincter tone.  Anus and perineum without scarring or rashes. No rectal masses are appreciated. Prostate is approximately *** grams, *** nodules are appreciated.  Seminal vesicles are normal.} Skin: No rashes, bruises or suspicious lesions. {Lymph: No cervical or inguinal adenopathy.} Neurologic: Grossly intact, no focal deficits, moving all 4 extremities. Psychiatric: Normal mood and affect.  Laboratory Data: Lab Results  Component Value Date   WBC 5.6 03/21/2017   HGB 14.8 03/21/2017   HCT 43.9 03/21/2017   MCV 93 03/21/2017   PLT 207 03/21/2017    Lab Results  Component Value Date   CREATININE 0.87 09/06/2017    PSA History  0.8 ng/mL on 10/30/2014  1.1 ng/mL on 01/18/2016  1.1 ng/mL on 01/31/2017  Pertinent Imaging: Results for JOHNN, KRASOWSKI (MRN [id number]) as of 05/01/2017 10:38  Ref. Range 05/01/2017 10:21  Scan Result Unknown 30ML     Assessment & Plan:    1. BPH with LUTS - IPSS score is {17/2}, it is {worsening} - Continue conservative management, avoiding bladder irritants and timed voiding's - continue Myrbetriq 25 mg daily{; refills given - hopefully not cost prohibitive } *** - RTC in one for IPSS and PVR  *** - BLADDER SCAN AMB NON-IMAGING  2. Nocturia -  Patient found the Myrbetriq effective  3. Erectile dysfunction *** - SHIM score is {11}, it is {worsening} *** - He and his wife are in counseling  - Repeat SHIM in one year  No follow-ups on file.  [facility] [address] [place], [place] [address] [phone]

## 2018-05-02 ENCOUNTER — Encounter: Payer: Self-pay | Admitting: Urology

## 2018-05-02 ENCOUNTER — Other Ambulatory Visit: Payer: Self-pay

## 2018-05-02 ENCOUNTER — Ambulatory Visit: Payer: Self-pay | Admitting: Urology

## 2018-05-02 DIAGNOSIS — N138 Other obstructive and reflux uropathy: Secondary | ICD-10-CM

## 2018-05-02 DIAGNOSIS — N401 Enlarged prostate with lower urinary tract symptoms: Principal | ICD-10-CM

## 2018-05-25 ENCOUNTER — Other Ambulatory Visit: Payer: Self-pay | Admitting: Family Medicine

## 2018-05-25 DIAGNOSIS — M5442 Lumbago with sciatica, left side: Principal | ICD-10-CM

## 2018-05-25 DIAGNOSIS — F411 Generalized anxiety disorder: Secondary | ICD-10-CM

## 2018-05-25 DIAGNOSIS — M5441 Lumbago with sciatica, right side: Principal | ICD-10-CM

## 2018-05-25 DIAGNOSIS — G8929 Other chronic pain: Secondary | ICD-10-CM

## 2018-05-25 DIAGNOSIS — M159 Polyosteoarthritis, unspecified: Secondary | ICD-10-CM

## 2018-05-25 DIAGNOSIS — M150 Primary generalized (osteo)arthritis: Secondary | ICD-10-CM

## 2018-05-25 DIAGNOSIS — F338 Other recurrent depressive disorders: Secondary | ICD-10-CM

## 2018-05-25 NOTE — Telephone Encounter (Signed)
Spoke with pt who states that he is now seeing a new provider and is no longer seeing Dr. Wynetta Emery. Notified pt to contact pharmacy to have refill request sent to new provider. Understanding verbalized.

## 2018-05-28 ENCOUNTER — Telehealth: Payer: Self-pay | Admitting: Family Medicine

## 2018-05-28 DIAGNOSIS — F338 Other recurrent depressive disorders: Secondary | ICD-10-CM

## 2018-05-28 DIAGNOSIS — F411 Generalized anxiety disorder: Secondary | ICD-10-CM

## 2018-05-28 MED ORDER — VENLAFAXINE HCL ER 75 MG PO CP24: 75.0000 mg | ORAL_CAPSULE | Freq: Every day | ORAL | 1 refills | Status: DC

## 2018-05-28 NOTE — Telephone Encounter (Signed)
Please notify patient that I have re ordered his Venlafaxine 75mg  - 90 day supply with refill - sent to [facility].  Nobie Putnam, [facility] 05/28/2018, 9:43 AM

## 2018-05-28 NOTE — Telephone Encounter (Signed)
Pt called wanted to know if you would fill Venlafaxine 75 mg called int to [facility]

## 2018-06-08 ENCOUNTER — Other Ambulatory Visit: Payer: Self-pay | Admitting: Family Medicine

## 2018-06-10 ENCOUNTER — Other Ambulatory Visit: Payer: Self-pay | Admitting: Family Medicine

## 2018-06-21 ENCOUNTER — Other Ambulatory Visit: Payer: Self-pay | Admitting: Family Medicine

## 2018-06-21 NOTE — Telephone Encounter (Signed)
Requested Prescriptions  Pending Prescriptions Disp Refills  . esomeprazole (NEXIUM) 20 MG capsule [Pharmacy Med Name: ESOMEPRAZOLE MAGNESIUM 20 MG CAP] 90 capsule 0    Sig: TAKE 1 CAPSULE BY MOUTH ONCE DAILY AT 12NOON.     Gastroenterology: Proton Pump Inhibitors Passed - 06/21/2018  3:54 PM      Passed - Valid encounter within last 12 months    Recent Outpatient Visits          5 months ago Acute swimmer's ear of right side   [place], Jeannette How, MD   8 months ago Pain of both hip joints   [facility] Carles Collet M, PA-C   8 months ago Acute bilateral low back pain with bilateral sciatica   [facility] Merrie Roof [place], [place]   9 months ago Mixed hyperlipidemia   [place], [place], DO   1 year ago Acute non-recurrent maxillary sinusitis   [facility] Valerie Roys, DO      Future Appointments            Tomorrow [place] Devonne Doughty, DO [facility]

## 2018-06-22 ENCOUNTER — Encounter: Payer: Self-pay | Admitting: Family Medicine

## 2018-06-22 ENCOUNTER — Ambulatory Visit (INDEPENDENT_AMBULATORY_CARE_PROVIDER_SITE_OTHER): Payer: Medicare Other | Admitting: Family Medicine

## 2018-06-22 ENCOUNTER — Other Ambulatory Visit: Payer: Self-pay

## 2018-06-22 DIAGNOSIS — M5442 Lumbago with sciatica, left side: Secondary | ICD-10-CM | POA: Diagnosis not present

## 2018-06-22 DIAGNOSIS — G8929 Other chronic pain: Secondary | ICD-10-CM

## 2018-06-22 DIAGNOSIS — M5441 Lumbago with sciatica, right side: Secondary | ICD-10-CM | POA: Diagnosis not present

## 2018-06-22 MED ORDER — PREDNISONE 10 MG PO TABS: ORAL_TABLET | ORAL | 0 refills | Status: DC

## 2018-06-22 MED ORDER — OXYCODONE-ACETAMINOPHEN 5-325 MG PO TABS: 1.0000 | ORAL_TABLET | Freq: Three times a day (TID) | ORAL | 0 refills | Status: AC | PRN

## 2018-06-22 MED ORDER — PREGABALIN 75 MG PO CAPS: 75.0000 mg | ORAL_CAPSULE | Freq: Two times a day (BID) | ORAL | 0 refills | Status: DC

## 2018-06-22 NOTE — Assessment & Plan Note (Signed)
Acute worsening Chronic b/l LBP with associated b/l vs alternating sciatica (now LEFT is worse) Suspect likely due to multiple factors chronic OA/DJD - no prior surgery - No red flag symptoms. Negative SLR for radiculopathy - Improved on topical NSAID, temporarily - Already seen Orthopedic. Last X-rays L spine, hips 10/2017 reviewed DJD Failed Tramadol, Gabapentin, Flexeril 5mg , lidocaine topical - ineffective  Plan: 1. Counseling on dx and scope of treatment for back pain / hip pain / OA-DJD - emphasis on pursuing further 2nd opinion now he has mostly maxed conservative therapy available to me - Given severity of his symptoms, limited options at this time, agree to short term acute therapy with opiate oxycodone-acetaminophen 5/325 - rx for 5 day supply only - he was taking occasional prior hydrocodone with relief at night PRN only - reviewed all precautions with opiate rx, and he understands risks with this med - Start Prednisone taper 60 to 10mg  daily over 6 days - HOLD NSAID on this med - Start LYRICA 75mg  BID - sent rx, likely may titrate dose - REFERRAL to [facility] Pain management - stay tuned for apt, may be able to establish virtually soon - PENDING further Orthopedic involvement next visit virtual next week on Monday - anticipate he may benefit from further advanced imaging and possibly ESI - he is interested in procedural intervention - May continue other conservative care - May take baclofen

## 2018-06-22 NOTE — Progress Notes (Signed)
Virtual Visit via Telephone The purpose of this virtual visit is to provide medical care while limiting exposure to the novel coronavirus (COVID19) for both patient and office staff.  Consent was obtained for phone visit:  Yes.   Answered questions that patient had about telehealth interaction:  Yes.   I discussed the limitations, risks, security and privacy concerns of performing an evaluation and management service by telephone. I also discussed with the patient that there may be a patient responsible charge related to this service. The patient expressed understanding and agreed to proceed.  Patient Location: Home Provider Location: [facility])  ---------------------------------------------------------------------- Chief Complaint  Patient presents with  . Sciatica    painful can't sleep at night left side onset 3 week but getting worst from last couple of days ROM very painful    S: Reviewed CMA telephone note below. I have called patient and gathered additional HPI as follows:   ACUTE on CHRONIC back pain sciatica Osteoarthritis multiple joints / Lumbar Spine Low Back w/ Sciatica - Last visit with me 03/21/18, for same problem chronic pain, treated with referral to Sports Med and Orthopedic, and topical diclofenac, see prior notes for background information. - Interval update with continued follow-up with specialist and limited benefit, now has virtual appointment with [facility] on 06/25/18, he is hopeful for advanced imaging in future to guide him to more definitive treatment options - Today patient reports recent acute worsening past few days, limiting his function and keeping him up at night due to L sided sciatica pain, worse in gluteal and some radiation, he is able to get through day but it is painful. He has old hydrocodone rx from prior nasal surgery, he has taken Hydrocodone-Acetaminophen 5/325 PRN usually 1-3 pills at night for pain, cannot take sedating  med during the day - Also has underlying foot issue affecting his gait, given orthotic sports insoles with some relief - using voltaren gel topical PRN - taking Meloxicam - Taking Baclofen PRN Failed Tramadol, Gabapentin, Flexeril 5mg , Topical diclofenac, Lidocaine patches - ineffective  he is interested in MRI in future - He describes worsening problem knee pain with increased activity, he attributes this to other factors as well including high arches in feet affecting his gait and prior history of great toe injury limiting his walking pattern, he feels like his lower extremity symptoms are related as well to his hip and low back symptoms with known sciatica radiating pain  Denies any fevers/chills, numbness, tingling, weakness, loss of control bladder/bowel incontinence or retention, unintentional wt loss, night sweats  Denies any high risk travel to areas of current concern for COVID19. Denies any known or suspected exposure to person with or possibly with COVID19.   Past Medical History:  Diagnosis Date  . Anxiety   . Arthritis    hand, knees, hip  . 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   Marland Kitchen BPH (benign prostatic hyperplasia)   . GERD (gastroesophageal reflux disease)   . Insomnia   . Minimal change disease    no issues for last 4-5 yrs  . Nocturia   . PSVT (paroxysmal supraventricular tachycardia) ([place])   . TIA (transient ischemic attack)    Se[facility] neurology at [facility] - last MRI 2017 showing stenosed M1   Social History   Tobacco Use  . Smoking status: Current Every Day Smoker    Years: 4.00    Types: Cigars  .  Smokeless tobacco: Current User  . Tobacco comment: 1 or 2 a day   Substance Use Topics  . Alcohol use: Yes    Alcohol/week: 18.0 standard drinks    Types: 1 Cans of beer, 14 Shots of liquor, 3 Standard drinks or equivalent per week  . Drug use: No    Current Outpatient Medications:  .  atorvastatin (LIPITOR) 80 MG  tablet, TAKE 1 TABLET BY MOUTH ONCE DAILY AT 6 P.M., Disp: 90 tablet, Rfl: 1 .  baclofen (LIORESAL) 10 MG tablet, Take 0.5-1 tablets (5-10 mg total) by mouth 3 (three) times daily as needed for muscle spasms., Disp: 60 each, Rfl: 2 .  busPIRone (BUSPAR) 15 MG tablet, TAKE 1 TABLET BY MOUTH TWICE DAILY, Disp: 180 tablet, Rfl: 1 .  diclofenac sodium (VOLTAREN) 1 % GEL, Apply 2 g topically daily as needed., Disp: 100 g, Rfl: 3 .  esomeprazole (NEXIUM) 20 MG capsule, TAKE 1 CAPSULE BY MOUTH ONCE DAILY AT 12NOON., Disp: 90 capsule, Rfl: 0 .  fluticasone (FLONASE) 50 MCG/ACT nasal spray, Place 2 sprays into both nostrils daily., Disp: 48 g, Rfl: 3 .  hydrOXYzine (VISTARIL) 25 MG capsule, Take 1-3 capsules (25-75 mg total) by mouth at bedtime as needed (for svere anxiety sx and sleep)., Disp: 270 capsule, Rfl: 1 .  ketoconazole (NIZORAL) 2 % shampoo, APPLY TOPICALLY AS NEEDED IRRITATION TO SCALP 1-2 TIMES A WEEK, Disp: 120 mL, Rfl: 1 .  meloxicam (MOBIC) 15 MG tablet, TAKE 1 TABLET BY MOUTH ONCE DAILY AS NEEDED UP TO 1 WEEK AT A TIME, Disp: 30 tablet, Rfl: 2 .  metoprolol succinate (TOPROL-XL) 25 MG 24 hr tablet, Take 1 tablet (25 mg total) by mouth daily., Disp: 90 tablet, Rfl: 1 .  Multiple Vitamins-Minerals (MULTIVITAMIN WITH MINERALS) tablet, Take 1 tablet by mouth daily., Disp: , Rfl:  .  triamcinolone ointment (KENALOG) 0.5 %, Apply 1 application topically 2 (two) times daily., Disp: 30 g, Rfl: 2 .  venlafaxine XR (EFFEXOR XR) 75 MG 24 hr capsule, Take 1 capsule (75 mg total) by mouth daily with breakfast., Disp: 90 capsule, Rfl: 1 .  oxyCODONE-acetaminophen (PERCOCET/ROXICET) 5-325 MG tablet, Take 1-2 tablets by mouth every 8 (eight) hours as needed for up to 5 days for severe pain., Disp: 30 tablet, Rfl: 0 .  predniSONE (DELTASONE) 10 MG tablet, Take 6 tabs with breakfast Day 1, 5 tabs Day 2, 4 tabs Day 3, 3 tabs Day 4, 2 tabs Day 5, 1 tab Day 6., Disp: 21 tablet, Rfl: 0 .  pregabalin (LYRICA) 75 MG  capsule, Take 1 capsule (75 mg total) by mouth 2 (two) times daily., Disp: 60 capsule, Rfl: 0  Depression screen [facility] 2/9 06/22/2018 03/21/2018 02/19/2018  Decreased Interest 0 0 0  Down, Depressed, Hopeless 0 0 0  PHQ - 2 Score 0 0 0  Altered sleeping - 0 1  Tired, decreased energy - 0 1  Change in appetite - 0 0  Feeling bad or failure about yourself  - 0 0  Trouble concentrating - 0 1  Moving slowly or fidgety/restless - 0 0  Suicidal thoughts - 0 0  PHQ-9 Score - 0 3  Difficult doing work/chores - Not difficult at all Not difficult at all    GAD 7 : Generalized Anxiety Score 06/22/2018 02/19/2018 10/24/2017 09/06/2017  Nervous, Anxious, on Edge 0 1 0 1  Control/stop worrying 0 0 0 1  Worry too much - different things 0 1 0 1  Trouble relaxing  0 1 0 1  Restless 0 0 0 0  Easily annoyed or irritable 0 1 0 1  Afraid - awful might happen 0 0 0 0  Total GAD 7 Score 0 4 0 5  Anxiety Difficulty Not difficult at all Somewhat difficult - Not difficult at all    -------------------------------------------------------------------------- O: No physical exam performed due to remote telephone encounter.  Lab results reviewed.  No results found for this or any previous visit (from the past 2160 hour(s)).  -------------------------------------------------------------------------- A&P:  Problem List Items Addressed This Visit    Chronic low back pain with bilateral sciatica - Primary    Acute worsening Chronic b/l LBP with associated b/l vs alternating sciatica (now LEFT is worse) Suspect likely due to multiple factors chronic OA/DJD - no prior surgery - No red flag symptoms. Negative SLR for radiculopathy - Improved on topical NSAID, temporarily - Already seen Orthopedic. Last X-rays L spine, hips 10/2017 reviewed DJD Failed Tramadol, Gabapentin, Flexeril 5mg , lidocaine topical - ineffective  Plan: 1. Counseling on dx and scope of treatment for back pain / hip pain / OA-DJD - emphasis on  pursuing further 2nd opinion now he has mostly maxed conservative therapy available to me - Given severity of his symptoms, limited options at this time, agree to short term acute therapy with opiate oxycodone-acetaminophen 5/325 - rx for 5 day supply only - he was taking occasional prior hydrocodone with relief at night PRN only - reviewed all precautions with opiate rx, and he understands risks with this med - Start Prednisone taper 60 to 10mg  daily over 6 days - HOLD NSAID on this med - Start LYRICA 75mg  BID - sent rx, likely may titrate dose - REFERRAL to [facility] Pain management - stay tuned for apt, may be able to establish virtually soon - PENDING further Orthopedic involvement next visit virtual next week on Monday - anticipate he may benefit from further advanced imaging and possibly ESI - he is interested in procedural intervention - May continue other conservative care - May take baclofen      Relevant Medications   pregabalin (LYRICA) 75 MG capsule   oxyCODONE-acetaminophen (PERCOCET/ROXICET) 5-325 MG tablet   predniSONE (DELTASONE) 10 MG tablet   Other Relevant Orders   Ambulatory referral to Pain Clinic    Other Visit Diagnoses    Acute left-sided back pain with sciatica       Relevant Medications   oxyCODONE-acetaminophen (PERCOCET/ROXICET) 5-325 MG tablet   predniSONE (DELTASONE) 10 MG tablet        Meds ordered this encounter  Medications  . pregabalin (LYRICA) 75 MG capsule    Sig: Take 1 capsule (75 mg total) by mouth 2 (two) times daily.    Dispense:  60 capsule    Refill:  0  . oxyCODONE-acetaminophen (PERCOCET/ROXICET) 5-325 MG tablet    Sig: Take 1-2 tablets by mouth every 8 (eight) hours as needed for up to 5 days for severe pain.    Dispense:  30 tablet    Refill:  0  . predniSONE (DELTASONE) 10 MG tablet    Sig: Take 6 tabs with breakfast Day 1, 5 tabs Day 2, 4 tabs Day 3, 3 tabs Day 4, 2 tabs Day 5, 1 tab Day 6.    Dispense:  21 tablet    Refill:  0     Follow-up: - Return in 4-6 weeks as needed for back pain med adjust  Patient verbalizes understanding with the above medical recommendations including the  limitation of remote medical advice.  Specific follow-up and call-back criteria were given for patient to follow-up or seek medical care more urgently if needed.   - Time spent in direct consultation with patient on phone: 17 minutes  Nobie Putnam, [facility] 06/22/2018, 8:57 AM

## 2018-06-22 NOTE — Patient Instructions (Addendum)
  Referral to Pain Management.  [facility] Address: [address], [place], [place] [address] Phone: [phone]  They can offer medication and also procedures such as spinal epidural injections and nerve ablation - stay tuned for a call or contact, they will likely do virtual visit and no rx on first visit is their policy  ---------------  1. For your Back Pain - I think that this is due to Muscle Spasms or strain. Your Sciatic Nerve can be affected causing some of your radiation and numbness down your legs.  - Start Prednisone taper (steroid anti-inflammatory) for nerve irritation with pain in legs. Each pill is 10mg . Take 6 pills (60mg  daily) for 1 day at same time with breakfast, then each day reduce dose by 1 pill, so 5 pills, then 4, then 3, then 2 then 1 (last 6 days). Do not take any Ibuprofen or Aleve while taking the Prednisone.   Once off prednisone can take meloxicam  Start Lyrica (pregabalin) 75mg  twice daily every day - good for chronic pain and nerve pain  May use baclofen as needed  May use oxycodone as needed for more severe pain as breakthrough option only - caution sedation  Recommend to start using heating pad on your lower back 1-2x daily for few weeks  This pain may take weeks to months to fully resolve, but hopefully it will respond to the medicine initially. All back injuries (small or serious) are slow to heal since we use our back muscles every day. Be careful with turning, twisting, lifting, sitting / standing for prolonged periods, and avoid re-injury.  If your symptoms significantly worsen with more pain, or new symptoms with weakness in one or both legs, new or different shooting leg pains, numbness in legs or groin, loss of control or retention of urine or bowel movements, please call back for advice and you may need to go directly to the Emergency Department.    Please schedule a Follow-up Appointment to: Return in about 4 weeks (around  07/20/2018), or if symptoms worsen or fail to improve, for back pain .  If you have any other questions or concerns, please feel free to call the office or send a message through [place]. You may also schedule an earlier appointment if necessary.  Additionally, you may be receiving a survey about your experience at our office within a few days to 1 week by e-mail or mail. We value your feedback.  Nobie Putnam, DO [place]

## 2018-06-25 DIAGNOSIS — M5416 Radiculopathy, lumbar region: Secondary | ICD-10-CM | POA: Diagnosis not present

## 2018-06-28 DIAGNOSIS — M5416 Radiculopathy, lumbar region: Secondary | ICD-10-CM | POA: Diagnosis not present

## 2018-07-02 DIAGNOSIS — M79671 Pain in right foot: Secondary | ICD-10-CM | POA: Diagnosis not present

## 2018-07-02 DIAGNOSIS — B070 Plantar wart: Secondary | ICD-10-CM | POA: Diagnosis not present

## 2018-07-02 DIAGNOSIS — M5416 Radiculopathy, lumbar region: Secondary | ICD-10-CM | POA: Diagnosis not present

## 2018-07-03 ENCOUNTER — Other Ambulatory Visit: Payer: Self-pay

## 2018-07-03 ENCOUNTER — Encounter: Payer: Self-pay | Admitting: Pain Medicine

## 2018-07-03 ENCOUNTER — Ambulatory Visit: Payer: Medicare Other | Attending: Pain Medicine | Admitting: Pain Medicine

## 2018-07-03 VITALS — Ht 69.0 in | Wt 194.0 lb

## 2018-07-03 DIAGNOSIS — M25551 Pain in right hip: Secondary | ICD-10-CM | POA: Diagnosis not present

## 2018-07-03 DIAGNOSIS — M5442 Lumbago with sciatica, left side: Secondary | ICD-10-CM | POA: Diagnosis not present

## 2018-07-03 DIAGNOSIS — Z79899 Other long term (current) drug therapy: Secondary | ICD-10-CM

## 2018-07-03 DIAGNOSIS — G894 Chronic pain syndrome: Secondary | ICD-10-CM | POA: Diagnosis not present

## 2018-07-03 DIAGNOSIS — M79604 Pain in right leg: Secondary | ICD-10-CM

## 2018-07-03 DIAGNOSIS — M76891 Other specified enthesopathies of right lower limb, excluding foot: Secondary | ICD-10-CM | POA: Diagnosis not present

## 2018-07-03 DIAGNOSIS — M25562 Pain in left knee: Secondary | ICD-10-CM

## 2018-07-03 DIAGNOSIS — Z789 Other specified health status: Secondary | ICD-10-CM

## 2018-07-03 DIAGNOSIS — M7918 Myalgia, other site: Secondary | ICD-10-CM | POA: Diagnosis not present

## 2018-07-03 DIAGNOSIS — M25552 Pain in left hip: Secondary | ICD-10-CM

## 2018-07-03 DIAGNOSIS — M5137 Other intervertebral disc degeneration, lumbosacral region: Secondary | ICD-10-CM | POA: Diagnosis not present

## 2018-07-03 DIAGNOSIS — M79605 Pain in left leg: Secondary | ICD-10-CM

## 2018-07-03 DIAGNOSIS — M79652 Pain in left thigh: Secondary | ICD-10-CM

## 2018-07-03 DIAGNOSIS — M5441 Lumbago with sciatica, right side: Secondary | ICD-10-CM

## 2018-07-03 DIAGNOSIS — M76892 Other specified enthesopathies of left lower limb, excluding foot: Secondary | ICD-10-CM

## 2018-07-03 DIAGNOSIS — M51379 Other intervertebral disc degeneration, lumbosacral region without mention of lumbar back pain or lower extremity pain: Secondary | ICD-10-CM

## 2018-07-03 DIAGNOSIS — M899 Disorder of bone, unspecified: Secondary | ICD-10-CM

## 2018-07-03 DIAGNOSIS — G8929 Other chronic pain: Secondary | ICD-10-CM

## 2018-07-03 NOTE — Patient Instructions (Signed)
____________________________________________________________________________________________  Preparing for Procedure with Sedation  Procedure appointments are limited to planned procedures: . No Prescription Refills. . No disability issues will be discussed. . No medication changes will be discussed.  Instructions: . Oral Intake: Do not eat or drink anything for at least 8 hours prior to your procedure. . Transportation: Public transportation is not allowed. Bring an adult driver. The driver must be physically present in our waiting room before any procedure can be started. . Physical Assistance: Bring an adult physically capable of assisting you, in the event you need help. This adult should keep you company at home for at least 6 hours after the procedure. . Blood Pressure Medicine: Take your blood pressure medicine with a sip of water the morning of the procedure. . Blood thinners: Notify our staff if you are taking any blood thinners. Depending on which one you take, there will be specific instructions on how and when to stop it. . Diabetics on insulin: Notify the staff so that you can be scheduled 1st case in the morning. If your diabetes requires high dose insulin, take only  of your normal insulin dose the morning of the procedure and notify the staff that you have done so. . Preventing infections: Shower with an antibacterial soap the morning of your procedure. . Build-up your immune system: Take 1000 mg of Vitamin C with every meal (3 times a day) the day prior to your procedure. . Antibiotics: Inform the staff if you have a condition or reason that requires you to take antibiotics before dental procedures. . Pregnancy: If you are pregnant, call and cancel the procedure. . Sickness: If you have a cold, fever, or any active infections, call and cancel the procedure. . Arrival: You must be in the facility at least 30 minutes prior to your scheduled procedure. . Children: Do not bring  children with you. . Dress appropriately: Bring dark clothing that you would not mind if they get stained. . Valuables: Do not bring any jewelry or valuables.  Reasons to call and reschedule or cancel your procedure: (Following these recommendations will minimize the risk of a serious complication.) . Surgeries: Avoid having procedures within 2 weeks of any surgery. (Avoid for 2 weeks before or after any surgery). . Flu Shots: Avoid having procedures within 2 weeks of a flu shots or . (Avoid for 2 weeks before or after immunizations). . Barium: Avoid having a procedure within 7-10 days after having had a radiological study involving the use of radiological contrast. (Myelograms, Barium swallow or enema study). . Heart attacks: Avoid any elective procedures or surgeries for the initial 6 months after a "Myocardial Infarction" (Heart Attack). . Blood thinners: It is imperative that you stop these medications before procedures. Let us know if you if you take any blood thinner.  . Infection: Avoid procedures during or within two weeks of an infection (including chest colds or gastrointestinal problems). Symptoms associated with infections include: Localized redness, fever, chills, night sweats or profuse sweating, burning sensation when voiding, cough, congestion, stuffiness, runny nose, sore throat, diarrhea, nausea, vomiting, cold or Flu symptoms, recent or current infections. It is specially important if the infection is over the area that we intend to treat. . Heart and lung problems: Symptoms that may suggest an active cardiopulmonary problem include: cough, chest pain, breathing difficulties or shortness of breath, dizziness, ankle swelling, uncontrolled high or unusually low blood pressure, and/or palpitations. If you are experiencing any of these symptoms, cancel your procedure and contact   your primary care physician for an evaluation.  Remember:  Regular Business hours are:  Monday to Thursday  8:00 AM to 4:00 PM  Provider's Schedule: [name], MD:  Procedure days: Tuesday and Thursday 7:30 AM to 4:00 PM  Gillis Santa, MD:  Procedure days: Monday and Wednesday 7:30 AM to 4:00 PM ____________________________________________________________________________________________   ____________________________________________________________________________________________  Medication Rules  Purpose: To inform patients, and their family members, of our rules and regulations.  Applies to: All patients receiving prescriptions (written or electronic).  Pharmacy of record: Pharmacy where electronic prescriptions will be sent. If written prescriptions are taken to a different pharmacy, please inform the nursing staff. The pharmacy listed in the electronic medical record should be the one where you would like electronic prescriptions to be sent.  Electronic prescriptions: In compliance with the [place] (STOP) Act of 2017 (Session Lanny Cramp [phone]), effective March 14, 2018, all controlled substances must be electronically prescribed. Calling prescriptions to the pharmacy will cease to exist.  Prescription refills: Only during scheduled appointments. Applies to all prescriptions.  NOTE: The following applies primarily to controlled substances (Opioid* Pain Medications).   Patient's responsibilities: 1. Pain Pills: Bring all pain pills to every appointment (except for procedure appointments). 2. Pill Bottles: Bring pills in original pharmacy bottle. Always bring the newest bottle. Bring bottle, even if empty. 3. Medication refills: You are responsible for knowing and keeping track of what medications you take and those you need refilled. The day before your appointment: write a list of all prescriptions that need to be refilled. The day of the appointment: give the list to the admitting nurse. Prescriptions will be written only during  appointments. No prescriptions will be written on procedure days. If you forget a medication: it will not be "Called in", "Faxed", or "electronically sent". You will need to get another appointment to get these prescribed. No early refills. Do not call asking to have your prescription filled early. 4. Prescription Accuracy: You are responsible for carefully inspecting your prescriptions before leaving our office. Have the discharge nurse carefully go over each prescription with you, before taking them home. Make sure that your name is accurately spelled, that your address is correct. Check the name and dose of your medication to make sure it is accurate. Check the number of pills, and the written instructions to make sure they are clear and accurate. Make sure that you are given enough medication to last until your next medication refill appointment. 5. Taking Medication: Take medication as prescribed. When it comes to controlled substances, taking less pills or less frequently than prescribed is permitted and encouraged. Never take more pills than instructed. Never take medication more frequently than prescribed.  6. Inform other Doctors: Always inform, all of your healthcare providers, of all the medications you take. 7. Pain Medication from other Providers: You are not allowed to accept any additional pain medication from any other Doctor or Healthcare provider. There are two exceptions to this rule. (see below) In the event that you require additional pain medication, you are responsible for notifying us, as stated below. 8. Medication Agreement: You are responsible for carefully reading and following our Medication Agreement. This must be signed before receiving any prescriptions from our practice. Safely store a copy of your signed Agreement. Violations to the Agreement will result in no further prescriptions. (Additional copies of our Medication Agreement are available upon request.) 9. Laws, Rules,  & Regulations: All patients are expected to follow all Federal  and State Laws, Statutes, Rules, & Regulations. Ignorance of the Laws does not constitute a valid excuse. The use of any illegal substances is prohibited. 10. Adopted CDC guidelines & recommendations: Target dosing levels will be at or below 60 MME/day. Use of benzodiazepines** is not recommended.  Exceptions: There are only two exceptions to the rule of not receiving pain medications from other Healthcare Providers. 1. Exception #1 (Emergencies): In the event of an emergency (i.e.: accident requiring emergency care), you are allowed to receive additional pain medication. However, you are responsible for: As soon as you are able, call our office [phone], at any time of the day or night, and leave a message stating your name, the date and nature of the emergency, and the name and dose of the medication prescribed. In the event that your call is answered by a member of our staff, make sure to document and save the date, time, and the name of the person that took your information.  2. Exception #2 (Planned Surgery): In the event that you are scheduled by another doctor or dentist to have any type of surgery or procedure, you are allowed (for a period no longer than 30 days), to receive additional pain medication, for the acute post-op pain. However, in this case, you are responsible for picking up a copy of our "Post-op Pain Management for Surgeons" handout, and giving it to your surgeon or dentist. This document is available at our office, and does not require an appointment to obtain it. Simply go to our office during business hours (Monday-Thursday from 8:00 AM to 4:00 PM) (Friday 8:00 AM to 12:00 Noon) or if you have a scheduled appointment with us, prior to your surgery, and ask for it by name. In addition, you will need to provide us with your name, name of your surgeon, type of surgery, and date of procedure or surgery.  *Opioid  medications include: morphine, codeine, oxycodone, oxymorphone, hydrocodone, hydromorphone, meperidine, tramadol, tapentadol, buprenorphine, fentanyl, methadone. **Benzodiazepine medications include: diazepam (Valium), alprazolam (Xanax), clonazepam (Klonopine), lorazepam (Ativan), clorazepate (Tranxene), chlordiazepoxide (Librium), estazolam (Prosom), oxazepam (Serax), temazepam (Restoril), triazolam (Halcion) (Last updated: 05/11/2017) ____________________________________________________________________________________________   ____________________________________________________________________________________________  Medication Recommendations and Reminders  Applies to: All patients receiving prescriptions (written and/or electronic).  Medication Rules & Regulations: These rules and regulations exist for your safety and that of others. They are not flexible and neither are we. Dismissing or ignoring them will be considered "non-compliance" with medication therapy, resulting in complete and irreversible termination of such therapy. (See document titled "Medication Rules" for more details.) In all conscience, because of safety reasons, we cannot continue providing a therapy where the patient does not follow instructions.  Pharmacy of record:   Definition: This is the pharmacy where your electronic prescriptions will be sent.   We do not endorse any particular pharmacy.  You are not restricted in your choice of pharmacy.  The pharmacy listed in the electronic medical record should be the one where you want electronic prescriptions to be sent.  If you choose to change pharmacy, simply notify our nursing staff of your choice of new pharmacy.  Recommendations:  Keep all of your pain medications in a safe place, under lock and key, even if you live alone.   After you fill your prescription, take 1 week's worth of pills and put them away in a safe place. You should keep a separate,  properly labeled bottle for this purpose. The remainder should be kept in the original bottle. Use   this as your primary supply, until it runs out. Once it's gone, then you know that you have 1 week's worth of medicine, and it is time to come in for a prescription refill. If you do this correctly, it is unlikely that you will ever run out of medicine.  To make sure that the above recommendation works, it is very important that you make sure your medication refill appointments are scheduled at least 1 week before you run out of medicine. To do this in an effective manner, make sure that you do not leave the office without scheduling your next medication management appointment. Always ask the nursing staff to show you in your prescription , when your medication will be running out. Then arrange for the receptionist to get you a return appointment, at least 7 days before you run out of medicine. Do not wait until you have 1 or 2 pills left, to come in. This is very poor planning and does not take into consideration that we may need to cancel appointments due to bad weather, sickness, or emergencies affecting our staff.  "Partial Fill": If for any reason your pharmacy does not have enough pills/tablets to completely fill or refill your prescription, do not allow for a "partial fill". You will need a separate prescription to fill the remaining amount, which we will not provide. If the reason for the partial fill is your insurance, you will need to talk to the pharmacist about payment alternatives for the remaining tablets, but again, do not accept a partial fill.  Prescription refills and/or changes in medication(s):   Prescription refills, and/or changes in dose or medication, will be conducted only during scheduled medication management appointments. (Applies to both, written and electronic prescriptions.)  No refills on procedure days. No medication will be changed or started on procedure days. No changes,  adjustments, and/or refills will be conducted on a procedure day. Doing so will interfere with the diagnostic portion of the procedure.  No phone refills. No medications will be "called into the pharmacy".  No Fax refills.  No weekend refills.  No Holliday refills.  No after hours refills.  Remember:  Business hours are:  Monday to Thursday 8:00 AM to 4:00 PM Provider's Schedule: Crystal King, NP - Appointments are:  Medication management: Monday to Thursday 8:00 AM to 4:00 PM Dareld Mcauliffe, MD - Appointments are:  Medication management: Monday and Wednesday 8:00 AM to 4:00 PM Procedure day: Tuesday and Thursday 7:30 AM to 4:00 PM Bilal Lateef, MD - Appointments are:  Medication management: Tuesday and Thursday 8:00 AM to 4:00 PM Procedure day: Monday and Wednesday 7:30 AM to 4:00 PM (Last update: 05/11/2017) ____________________________________________________________________________________________   ____________________________________________________________________________________________  CANNABIDIOL (AKA: CBD Oil or Pills)  Applies to: All patients receiving prescriptions of controlled substances (written and/or electronic).  General Information: Cannabidiol (CBD) was discovered in 1940. It is one of some 113 identified cannabinoids in cannabis (Marijuana) plants, accounting for up to 40% of the plant's extract. As of 2018, preliminary clinical research on cannabidiol included studies of anxiety, cognition, movement disorders, and pain.  Cannabidiol is consummed in multiple ways, including inhalation of cannabis smoke or vapor, as an aerosol spray into the cheek, and by mouth. It may be supplied as CBD oil containing CBD as the active ingredient (no added tetrahydrocannabinol (THC) or terpenes), a full-plant CBD-dominant hemp extract oil, capsules, dried cannabis, or as a liquid solution. CBD is thought not have the same psychoactivity as THC, and may affect the   actions  of THC. Studies suggest that CBD may interact with different biological targets, including cannabinoid receptors and other neurotransmitter receptors. As of 2018 the mechanism of action for its biological effects has not been determined.  In the [place], cannabidiol has a limited approval by the Food and Drug Administration (FDA) for treatment of only two types of epilepsy disorders. The side effects of long-term use of the drug include somnolence, decreased appetite, diarrhea, fatigue, malaise, weakness, sleeping problems, and others.  CBD remains a Schedule I drug prohibited for any use.  Legality: Some manufacturers ship CBD products nationally, an illegal action which the FDA has not enforced in 2018, with CBD remaining the subject of an FDA investigational new drug evaluation, and is not considered legal as a dietary supplement or food ingredient as of December 2018. Federal illegality has made it difficult historically to conduct research on CBD. CBD is openly sold in head shops and health food stores in some states where such sales have not been explicitly legalized.  Warning: Because it is not FDA approved for general use or treatment of pain, it is not required to undergo the same manufacturing controls as prescription drugs.  This means that the available cannabidiol (CBD) may be contaminated with THC.  If this is the case, it will trigger a positive urine drug screen (UDS) test for cannabinoids (Marijuana).  Because a positive UDS for illicit substances is a violation of our medication agreement, your opioid analgesics (pain medicine) may be permanently discontinued. (Last update: 06/01/2017) ____________________________________________________________________________________________    ______________________________________________________________________________________________  Specialty Pain Scale  Introduction:  There are significant differences in how pain is reported. The  word pain usually refers to physical pain, but it is also a common synonym of suffering. The medical community uses a scale from 0 (zero) to 10 (ten) to report pain level. Zero (0) is described as "no pain", while ten (10) is described as "the worse pain you can imagine". The problem with this scale is that physical pain is reported along with suffering. Suffering refers to mental pain, or more often yet it refers to any unpleasant feeling, emotion or aversion associated with the perception of harm or threat of harm. It is the psychological component of pain.  Pain Specialists prefer to separate the two components. The pain scale used by this practice is the Verbal Numerical Rating Scale (VNRS-11). This scale is for the physical pain only. DO NOT INCLUDE how your pain psychologically affects you. This scale is for adults 6 years of age and older. It has 11 (eleven) levels. The 1st level is 0/10. This means: "right now, I have no pain". In the context of pain management, it also means: "right now, my physical pain is under control with the current therapy".  General Information:  The scale should reflect your current level of pain. Unless you are specifically asked for the level of your worst pain, or your average pain. If you are asked for one of these two, then it should be understood that it is over the past 24 hours.  Levels 1 (one) through 5 (five) are described below, and can be treated as an outpatient. Ambulatory pain management facilities such as ours are more than adequate to treat these levels. Levels 6 (six) through 10 (ten) are also described below, however, these must be treated as a hospitalized patient. While levels 6 (six) and 7 (seven) may be evaluated at an urgent care facility, levels 8 (eight) through 10 (ten) constitute medical  emergencies and as such, they belong in a hospital's emergency department. When having these levels (as described below), do not come to our office. Our facility  is not equipped to manage these levels. Go directly to an urgent care facility or an emergency department to be evaluated.  Definitions:  Activities of Daily Living (ADL): Activities of daily living (ADL or ADLs) is a term used in healthcare to refer to people's daily self-care activities. Health professionals often use a person's ability or inability to perform ADLs as a measurement of their functional status, particularly in regard to people post injury, with disabilities and the elderly. There are two ADL levels: Basic and Instrumental. Basic Activities of Daily Living (BADL  or BADLs) consist of self-care tasks that include: Bathing and showering; personal hygiene and grooming (including brushing/combing/styling hair); dressing; Toilet hygiene (getting to the toilet, cleaning oneself, and getting back up); eating and self-feeding (not including cooking or chewing and swallowing); functional mobility, often referred to as "transferring", as measured by the ability to walk, get in and out of bed, and get into and out of a chair; the broader definition (moving from one place to another while performing activities) is useful for people with different physical abilities who are still able to get around independently. Basic ADLs include the things many people do when they get up in the morning and get ready to go out of the house: get out of bed, go to the toilet, bathe, dress, groom, and eat. On the average, loss of function typically follows a particular order. Hygiene is the first to go, followed by loss of toilet use and locomotion. The last to go is the ability to eat. When there is only one remaining area in which the person is independent, there is a 62.9% chance that it is eating and only a 3.5% chance that it is hygiene. Instrumental Activities of Daily Living (IADL or IADLs) are not necessary for fundamental functioning, but they let an individual live independently in a community. IADL consist of  tasks that include: cleaning and maintaining the house; home establishment and maintenance; care of others (including selecting and supervising caregivers); care of pets; child rearing; managing money; managing financials (investments, etc.); meal preparation and cleanup; shopping for groceries and necessities; moving within the community; safety procedures and emergency responses; health management and maintenance (taking prescribed medications); and using the telephone or other form of communication.  Instructions:  Most patients tend to report their pain as a combination of two factors, their physical pain and their psychosocial pain. This last one is also known as "suffering" and it is reflection of how physical pain affects you socially and psychologically. From now on, report them separately.  From this point on, when asked to report your pain level, report only your physical pain. Use the following table for reference.  Pain Clinic Pain Levels (0-5/10)  Pain Level Score  Description  No Pain 0   Mild pain 1 Nagging, annoying, but does not interfere with basic activities of daily living (ADL). Patients are able to eat, bathe, get dressed, toileting (being able to get on and off the toilet and perform personal hygiene functions), transfer (move in and out of bed or a chair without assistance), and maintain continence (able to control bladder and bowel functions). Blood pressure and heart rate are unaffected. A normal heart rate for a healthy adult ranges from 60 to 100 bpm (beats per minute).   Mild to moderate pain 2 Noticeable and distracting.  Impossible to hide from other people. More frequent flare-ups. Still possible to adapt and function close to normal. It can be very annoying and may have occasional stronger flare-ups. With discipline, patients may get used to it and adapt.   Moderate pain 3 Interferes significantly with activities of daily living (ADL). It becomes difficult to feed, bathe,  get dressed, get on and off the toilet or to perform personal hygiene functions. Difficult to get in and out of bed or a chair without assistance. Very distracting. With effort, it can be ignored when deeply involved in activities.   Moderately severe pain 4 Impossible to ignore for more than a few minutes. With effort, patients may still be able to manage work or participate in some social activities. Very difficult to concentrate. Signs of autonomic nervous system discharge are evident: dilated pupils (mydriasis); mild sweating (diaphoresis); sleep interference. Heart rate becomes elevated (>115 bpm). Diastolic blood pressure (lower number) rises above 100 mmHg. Patients find relief in laying down and not moving.   Severe pain 5 Intense and extremely unpleasant. Associated with frowning face and frequent crying. Pain overwhelms the senses.  Ability to do any activity or maintain social relationships becomes significantly limited. Conversation becomes difficult. Pacing back and forth is common, as getting into a comfortable position is nearly impossible. Pain wakes you up from deep sleep. Physical signs will be obvious: pupillary dilation; increased sweating; goosebumps; brisk reflexes; cold, clammy hands and feet; nausea, vomiting or dry heaves; loss of appetite; significant sleep disturbance with inability to fall asleep or to remain asleep. When persistent, significant weight loss is observed due to the complete loss of appetite and sleep deprivation.  Blood pressure and heart rate becomes significantly elevated. Caution: If elevated blood pressure triggers a pounding headache associated with blurred vision, then the patient should immediately seek attention at an urgent or emergency care unit, as these may be signs of an impending stroke.    Emergency Department Pain Levels (6-10/10)  Emergency Room Pain 6 Severely limiting. Requires emergency care and should not be seen or managed at an outpatient  pain management facility. Communication becomes difficult and requires great effort. Assistance to reach the emergency department may be required. Facial flushing and profuse sweating along with potentially dangerous increases in heart rate and blood pressure will be evident.   Distressing pain 7 Self-care is very difficult. Assistance is required to transport, or use restroom. Assistance to reach the emergency department will be required. Tasks requiring coordination, such as bathing and getting dressed become very difficult.   Disabling pain 8 Self-care is no longer possible. At this level, pain is disabling. The individual is unable to do even the most "basic" activities such as walking, eating, bathing, dressing, transferring to a bed, or toileting. Fine motor skills are lost. It is difficult to think clearly.   Incapacitating pain 9 Pain becomes incapacitating. Thought processing is no longer possible. Difficult to remember your own name. Control of movement and coordination are lost.   The worst pain imaginable 10 At this level, most patients pass out from pain. When this level is reached, collapse of the autonomic nervous system occurs, leading to a sudden drop in blood pressure and heart rate. This in turn results in a temporary and dramatic drop in blood flow to the brain, leading to a loss of consciousness. Fainting is one of the body's self defense mechanisms. Passing out puts the brain in a calmed state and causes it to shut down for a  while, in order to begin the healing process.    Summary: 1.   Refer to this scale when providing [place] with your pain level. 2.   Be accurate and careful when reporting your pain level. This will help with your care. 3.   Over-reporting your pain level will lead to loss of credibility. 4.   Even a level of 1/10 means that there is pain and will be treated at our facility. 5.   High, inaccurate reporting will be documented as "Symptom Exaggeration", leading to  loss of credibility and suspicions of possible secondary gains such as obtaining more narcotics, or wanting to appear disabled, for fraudulent reasons. 6.   Only pain levels of 5 or below will be seen at our facility. 7.   Pain levels of 6 and above will be sent to the Emergency Department and the appointment cancelled.  ______________________________________________________________________________________________

## 2018-07-03 NOTE — Progress Notes (Signed)
Patient's Name: Matthew Maldonado  MRN: [id number]  Referring Provider: Nobie Putnam *  DOB: [birth year]  PCP: Olin Hauser, DO  DOS: 07/03/2018  Note by: Gaspar Cola, MD  Service setting: Ambulatory outpatient  Specialty: Interventional Pain Management  Location: [facility] Virtual Visit  Visit type: Initial Patient Evaluation  Patient type: New Patient   Pain Management Virtual Encounter Note - Virtual Visit via Telephone Telehealth (real-time audio visits between healthcare provider and patient).  Patient's Phone No.:  [phone] (home); [phone] (mobile); (Preferred) [phone] [email or web address]  [facility], [place] - [place] [address] Phone: [phone] Fax: [place], [place] [address] Phone: [phone] Fax: [phone]   Pre-screening note:  Our staff contacted Matthew Maldonado and offered him an "in person", "face-to-face" appointment versus a telephone encounter. He indicated preferring the telephone encounter, at this time.  Primary Reason(s) for Visit: Tele-Encounter for initial evaluation of one or more chronic problems (new to examiner) potentially causing chronic pain, and posing a threat to normal musculoskeletal function. (Level of risk: High) CC: Pain (glut bilateral)  I contacted Matthew Maldonado on 07/03/2018 at 10:16 AM via telephone and clearly identified myself as Gaspar Cola, MD. I verified that I was speaking with the correct person using two identifiers (Name and date of birth: 21-Sep-1950).  Advanced Informed Consent I sought verbal advanced consent from Matthew Maldonado for virtual visit interactions. I informed Matthew Maldonado of possible security and privacy concerns, risks, and limitations associated with providing "not-in-person" medical evaluation and management services. I also informed Mr.  Maldonado of the availability of "in-person" appointments. Finally, I informed him that there would be a charge for the virtual visit and that he could be  personally, fully or partially, financially responsible for it. Matthew Maldonado expressed understanding and agreed to proceed.   HPI  Matthew Maldonado is a 68 y.o. year old, male patient, contacted today for an initial evaluation of his chronic pain. He has Insomnia, persistent; Acid reflux; Bursitis, trochanteric; H/O malignant neoplasm of skin; Epithelial-cell disease; BPH (benign prostatic hypertrophy) with urinary obstruction; BPH with obstruction/lower urinary tract symptoms; Nocturia; Paroxysmal supraventricular tachycardia ([place]); Hyperlipidemia; Seasonal affective disorder ([place]); Caregiver stress; Bradycardia; Bundle branch block, right; Hemispheric carotid artery syndrome; History of TIAs; Periodic heart flutter; Right arm pain; GAD (generalized anxiety disorder); Chronic low back pain with bilateral sciatica; Primary osteoarthritis involving multiple joints; Seborrheic dermatitis of scalp; Middle cerebral artery stenosis, left; Chronic knee pain (Fifth Area of Pain) (Left); Pes cavus; Chronic hip pain (Tertiary Area of Pain) (Bilateral) (L>R); Minimal change disease; Personal history of skin cancer; Chronic pain syndrome; Chronic pain of lower extremity (Primary area of Pain) (Bilateral) (L>R); Chronic buttock pain (Secondary Area of Pain) (Bilateral) (L>R); Chronic thigh pain (Fourth Area of Pain) (Left); Disorder of skeletal system; Pharmacologic therapy; Problems influencing health status; Enthesopathy of hip region on both sides; and DDD (degenerative disc disease), lumbosacral on their problem list.  Pain Assessment: Location: Left, Right Buttocks Radiating: down left leg to ankle Onset: More than a month ago Duration: Chronic pain Quality: Aching, Constant, Discomfort Severity: 2 /10 (subjective, self-reported pain score)  Effect on ADL: unable to walk,  drive sleep, golf Timing: Constant Modifying factors: Oxycodone, Streching  Onset and Duration: Gradual Cause of pain: Unknown Severity: Getting worse, NAS-11 at its worse: 9/10, NAS-11 at its  best: 2/10, NAS-11 now: 2/10 and NAS-11 on the average: 5/10 Timing: Evening and Night Aggravating Factors: Bending, Kneeling, Lifiting, Motion, Prolonged sitting, Prolonged standing, Squatting, Twisting, Walking, Walking uphill and Walking downhill Alleviating Factors: Stretching, Medications and Resting Associated Problems: Pain that wakes patient up and Pain that does not allow patient to sleep Quality of Pain: Aching, Constant, Disabling, Horrible, Nagging and Sharp Previous Examinations or Tests: MRI scan, X-rays and Orthopedic evaluation Previous Treatments: Stretching exercises  According to the patient his primary pain is that of the lower extremities, bilaterally, with the left being worse than the right.  The pain is primarily from the knee to the ankle.  He denies any numbness or weakness.  He describes the pain as being constant.  He denies any prior surgery or trauma to the region.  He also denies any nerve conduction test, physical therapy, or recent imaging.  The pain pattern is identical in both lower extremities but the left is more intense.  The secondary area of pain is that of the buttocks, bilaterally, with the left being worse than the right.  This pain is constant.  He denies any prior surgery or trauma.  He also denies any physical therapy or interventional therapies.  The third area of pain inside of the hips, bilaterally, with the left being worse than the right.  This pain in particular is intermittent in nature.  He denies any prior surgeries, trauma, or injection therapy.  He does admit to having had bilateral x-rays of the hips, which we were able to evaluate today.  The fourth area of pain is that of his left thigh, primarily in the posterior aspect.  He denies any prior  surgeries, trauma, or x-rays of this region.  He also denies any injection therapy.  The fifth area of pain is that of the left knee.  He denies any prior surgeries, injections, trauma, or recent x-rays.  In general he describes having treated his pain with massage therapy, the use of a TENS unit, Voltaren gel, Biofreeze, acupuncture, stretching exercises which he continues to do at home, and more recently he was switched from hydrocodone 2 tablets at bedtime to oxycodone 2 tablets at bedtime.  He also was started on Lyrica 75 mg p.o. twice daily.  He describes having had an MRI of the lumbar spine at [facility] injure him and he has seen Dr. Harlow Mares, also at [facility].  He apparently evaluated the results of the MRI and indicated that he may need some epidural steroid injections at the L3, L4, and/or L5 levels.  Unfortunately, this MRI seems to have been done at [facility] and not add any [facility], [facility], or [facility] facilities and therefore we did not have available tools neither the images nor the official report some the test.  The patient was informed that my practice is divided into two sections: an interventional pain management section, as well as a completely separate and distinct medication management section. I explained that I have procedure days for my interventional therapies, and evaluation days for follow-ups and medication management. Because of the amount of documentation required during both, they are kept separated. This means that there is the possibility that he may be scheduled for a procedure on one day, and medication management the next. I have also informed him that because of staffing and facility limitations, I no longer take patients for medication management only. To illustrate the reasons for this, I gave the patient the example of surgeons, and  how inappropriate it would be to refer a patient to his/her care, just to write for the post-surgical antibiotics on a surgery done by a  different surgeon.   Because interventional pain management is my board-certified specialty, the patient was informed that joining my practice means that they are open to any and all interventional therapies. I made it clear that this does not mean that they will be forced to have any procedures done. What this means is that I believe interventional therapies to be essential part of the diagnosis and proper management of chronic pain conditions. Therefore, patients not interested in these interventional alternatives will be better served under the care of a different practitioner.  The patient was also made aware of my Comprehensive Pain Management Safety Guidelines where by joining my practice, they limit all of their nerve blocks and joint injections to those done by our practice, for as long as we are retained to manage their care.   Historic Controlled Substance Pharmacotherapy Review  PMP and historical list of controlled substances: Oxycodone/APAP 5/325; hydrocodone/APAP 5/325; tramadol 50 mg; Tylenol 3; alprazolam 0.5 mg; alprazolam 0.25 mg; guaifenesin AC cough syrup.  The patient indicates actually taking the oxycodone/APAP 5/325 2 tablets p.o. at bedtime and none during the day.  He also refers that he does not take the hydrocodone and the oxycodone same time.  He indicates that he was initially on the hydrocodone but since it did not help he was switched to the oxycodone. Most recent opioid analgesic: Oxycodone-Acetaminophen 5-325 1 tablet p.o. every 4 hours (#30) (filled: 06/22/18) (by: Charletta Cousin, DO) Current opioid analgesics: Oxycodone-Acetaminophen 5-325 1 tablet p.o. every 4 hours  Highest recorded MME/day: 45 mg/day MME/day: 45 mg/day Medications: Bottles not available for inspection. Pharmacodynamics: Desired effects: Analgesia: The patient reports >50% benefit. Reported improvement in function: The patient reports medication allows him to accomplish basic  ADLs. Clinically meaningful improvement in function (CMIF): Sustained CMIF goals met Perceived effectiveness: Described as relatively effective, allowing for increase in activities of daily living (ADL) Undesirable effects: Side-effects or Adverse reactions: None reported Historical Monitoring: The patient  reports no history of drug use. List of all UDS Test(s): No results found. List of other Serum/Urine Drug Screening Test(s):  No results found. Historical Background Evaluation: [place] PMP: PDMP reviewed during this encounter. Six (6) year initial data search conducted.             PMP NARX Score Report:  Narcotic: 270 Sedative: 241 Stimulant: 000 [place] Department of public safety, offender search: Editor, commissioning Information) Non-contributory Risk Assessment Profile: Aberrant behavior: None observed or detected today Risk factors for fatal opioid overdose: None identified today PMP NARX Overdose Risk Score: 280 Fatal overdose hazard ratio (HR): Calculation deferred Non-fatal overdose hazard ratio (HR): Calculation deferred Risk of opioid abuse or dependence: 0.7-3.0% with doses ? 36 MME/day and 6.1-26% with doses ? 120 MME/day. Substance use disorder (SUD) risk level: See below Personal History of Substance Abuse (SUD-Substance use disorder):  Alcohol: Negative  Illegal Drugs: Negative  Rx Drugs: Negative(Takes only what is prescribed.)  ORT Risk Level calculation: Low Risk Opioid Risk Tool - 07/03/18 1316      Family History of Substance Abuse   Alcohol  Negative    Illegal Drugs  Negative    Rx Drugs  Negative      Personal History of Substance Abuse   Alcohol  Negative    Illegal Drugs  Negative    Rx Drugs  Negative  Takes only what is prescribed.     Total Score   Opioid Risk Tool Scoring  0    Opioid Risk Interpretation  Low Risk      ORT Scoring interpretation table:  Score <3 = Low Risk for SUD  Score between 4-7 = Moderate Risk for SUD  Score >8 = High Risk for  Opioid Abuse   Pharmacologic Plan: As per protocol, I have not taken over any controlled substance management, pending the results of ordered tests and/or consults.            Initial impression: Pending review of available data and ordered tests.  Meds   Current Outpatient Medications:  .  atorvastatin (LIPITOR) 80 MG tablet, TAKE 1 TABLET BY MOUTH ONCE DAILY AT 6 P.M., Disp: 90 tablet, Rfl: 1 .  baclofen (LIORESAL) 10 MG tablet, Take 0.5-1 tablets (5-10 mg total) by mouth 3 (three) times daily as needed for muscle spasms., Disp: 60 each, Rfl: 2 .  busPIRone (BUSPAR) 15 MG tablet, TAKE 1 TABLET BY MOUTH TWICE DAILY, Disp: 180 tablet, Rfl: 1 .  diclofenac sodium (VOLTAREN) 1 % GEL, Apply 2 g topically daily as needed., Disp: 100 g, Rfl: 3 .  esomeprazole (NEXIUM) 20 MG capsule, TAKE 1 CAPSULE BY MOUTH ONCE DAILY AT 12NOON., Disp: 90 capsule, Rfl: 0 .  fluticasone (FLONASE) 50 MCG/ACT nasal spray, Place 2 sprays into both nostrils daily., Disp: 48 g, Rfl: 3 .  hydrOXYzine (VISTARIL) 25 MG capsule, Take 1-3 capsules (25-75 mg total) by mouth at bedtime as needed (for svere anxiety sx and sleep)., Disp: 270 capsule, Rfl: 1 .  ketoconazole (NIZORAL) 2 % shampoo, APPLY TOPICALLY AS NEEDED IRRITATION TO SCALP 1-2 TIMES A WEEK, Disp: 120 mL, Rfl: 1 .  meloxicam (MOBIC) 15 MG tablet, TAKE 1 TABLET BY MOUTH ONCE DAILY AS NEEDED UP TO 1 WEEK AT A TIME, Disp: 30 tablet, Rfl: 2 .  metoprolol succinate (TOPROL-XL) 25 MG 24 hr tablet, Take 1 tablet (25 mg total) by mouth daily., Disp: 90 tablet, Rfl: 1 .  Multiple Vitamins-Minerals (MULTIVITAMIN WITH MINERALS) tablet, Take 1 tablet by mouth daily., Disp: , Rfl:  .  oxyCODONE-acetaminophen (PERCOCET/ROXICET) 5-325 MG tablet, Take 1 tablet by mouth 2 (two) times daily. , Disp: , Rfl:  .  pregabalin (LYRICA) 75 MG capsule, Take 1 capsule (75 mg total) by mouth 2 (two) times daily., Disp: 60 capsule, Rfl: 0 .  triamcinolone ointment (KENALOG) 0.5 %, Apply 1  application topically 2 (two) times daily., Disp: 30 g, Rfl: 2 .  venlafaxine XR (EFFEXOR XR) 75 MG 24 hr capsule, Take 1 capsule (75 mg total) by mouth daily with breakfast., Disp: 90 capsule, Rfl: 1  ROS  Cardiovascular: No reported cardiovascular signs or symptoms such as High blood pressure, coronary artery disease, abnormal heart rate or rhythm, heart attack, blood thinner therapy or heart weakness and/or failure Pulmonary or Respiratory: Smoking Neurological: No reported neurological signs or symptoms such as seizures, abnormal skin sensations, urinary and/or fecal incontinence, being born with an abnormal open spine and/or a tethered spinal cord Review of Past Neurological Studies: No results found for this or any previous visit. Psychological-Psychiatric: No reported psychological or psychiatric signs or symptoms such as difficulty sleeping, anxiety, depression, delusions or hallucinations (schizophrenial), mood swings (bipolar disorders) or suicidal ideations or attempts Gastrointestinal: Inflamed liver (Hepatitis) Genitourinary: No reported renal or genitourinary signs or symptoms such as difficulty voiding or producing urine, peeing blood, non-functioning kidney, kidney stones, difficulty emptying  the bladder, difficulty controlling the flow of urine, or chronic kidney disease Hematological: No reported hematological signs or symptoms such as prolonged bleeding, low or poor functioning platelets, bruising or bleeding easily, hereditary bleeding problems, low energy levels due to low hemoglobin or being anemic Endocrine: No reported endocrine signs or symptoms such as high or low blood sugar, rapid heart rate due to high thyroid levels, obesity or weight gain due to slow thyroid or thyroid disease Rheumatologic: Joint aches and or swelling due to excess weight (Osteoarthritis) Musculoskeletal: Negative for myasthenia gravis, muscular dystrophy, multiple sclerosis or malignant  hyperthermia Work History: Retired  Allergies  Matthew Maldonado has No Known Allergies.  Laboratory Chemistry  Inflammation Markers (CRP: Acute Phase) (ESR: Chronic Phase) No results found.  Rheumatology Markers No results found.  Renal Function Markers Lab Results  Component Value Date   BUN 23 09/06/2017   CREATININE 0.87 09/06/2017   BCR 26 (H) 09/06/2017   GFRAA 104 09/06/2017   GFRNONAA 90 09/06/2017                             Hepatic Function Markers Lab Results  Component Value Date   AST 41 (H) 09/06/2017   ALT 40 09/06/2017   ALBUMIN 4.9 (H) 09/06/2017   ALKPHOS 74 09/06/2017                        Electrolytes Lab Results  Component Value Date   NA 142 09/06/2017   K 4.4 09/06/2017   CL 105 09/06/2017   CALCIUM 9.5 09/06/2017                        Neuropathy Markers No results found.  CNS Tests No results found.  Bone Pathology Markers No results found.  Coagulation Parameters Lab Results  Component Value Date   PLT 207 03/21/2017                        Cardiovascular Markers Lab Results  Component Value Date   CKTOTAL 385 (H) 06/11/2011   CKMB 3.9 (H) 06/11/2011   TROPONINI <0.03 11/26/2015   HGB 14.8 03/21/2017   HCT 43.9 03/21/2017                         ID Markers No results found.  CA Markers No results found.  Endocrine Markers Lab Results  Component Value Date   TSH 1.770 03/21/2017   FREET4 0.74 11/26/2015                        Note: Lab results reviewed.  Imaging Review  Lumbosacral Imaging: Lumbar DG (Complete) 4+V:  Results for orders placed during the hospital encounter of 10/16/17  DG Lumbar Spine Complete   Narrative CLINICAL DATA:  Chronic low back pain  EXAM: LUMBAR SPINE - COMPLETE 4+ VIEW  COMPARISON:  01/12/2011  FINDINGS: Five lumbar type vertebral bodies are well visualized. Mild osteophytic changes are noted. No pars defects are seen. Mild facet hypertrophic changes are seen. No soft tissue  abnormality is noted.  IMPRESSION: Mild degenerative change without acute abnormality   Electronically Signed   By: Inez Catalina M.D.   On: 10/16/2017 14:42          Hip Imaging: Hip-R DG 2-3 views:  Results for orders placed during  the hospital encounter of 10/17/17  DG HIP UNILAT WITH PELVIS 2-3 VIEWS RIGHT   Narrative CLINICAL DATA:  Pain after lifting.  EXAM: DG HIP (WITH OR WITHOUT PELVIS) 2-3V RIGHT  COMPARISON:  Lumbar spine series 10/16/2017. Lumbar spine series 01/04/2011.  FINDINGS: Degenerative changes lumbar spine and both hips. No acute bony abnormality identified. No evidence of fracture or dislocation. Tiny sclerotic density noted the proximal right femur, right femoral series suggested for further evaluation.  IMPRESSION: 1. Degenerative changes lumbar spine and both hips. No acute bony abnormality.  2. Tiny sclerotic density noted the proximal right femur. Right femoral series suggested for further evaluation.   Electronically Signed   By: Marcello Moores  Register   On: 10/17/2017 16:52    Hip-L DG 2-3 views:  Results for orders placed during the hospital encounter of 10/17/17  DG HIP UNILAT WITH PELVIS 2-3 VIEWS LEFT   Narrative CLINICAL DATA:  Hip pain.  Low back pain.  EXAM: DG HIP (WITH OR WITHOUT PELVIS) 2-3V LEFT  COMPARISON:  No prior.  FINDINGS: No acute bony or joint abnormality identified. No evidence of fracture or dislocation. Degenerative changes left hip.  IMPRESSION: No acute bony abnormality identified. Degenerative changes left hip.   Electronically Signed   By: Marcello Moores  Register   On: 10/17/2017 16:49    Complexity Note: Imaging results reviewed. Results shared with Matthew Maldonado, using Layman's terms.                         [place]  Drug: Matthew Maldonado  reports no history of drug use. Alcohol:  reports current alcohol use of about 18.0 standard drinks of alcohol per week. Tobacco:  reports that he has been smoking cigars. He has  smoked for the past 4.00 years. He uses smokeless tobacco. Medical:  has a past medical history of Anxiety, Arthritis, Blood vessel disorder, BPH (benign prostatic hyperplasia), GERD (gastroesophageal reflux disease), Insomnia, Minimal change disease, Nocturia, PSVT (paroxysmal supraventricular tachycardia) ([place]), and TIA (transient ischemic attack). Family: family history includes Alcohol abuse in his brother; Heart disease in his brother and father; Osteoporosis in his mother.  Past Surgical History:  Procedure Laterality Date  . EXCISION NASAL MASS Left 03/22/2018   Procedure: EXCISION nasopharyngeal lesion;  Surgeon: Margaretha Sheffield, MD;  Location: [facility];  Service: ENT;  Laterality: Left;  . NASAL ENDOSCOPY Bilateral 03/22/2018   Procedure: NASAL ENDOSCOPY NASOPHARYNGESCOPY;  Surgeon: Margaretha Sheffield, MD;  Location: [facility];  Service: ENT;  Laterality: Bilateral;  . NASAL SEPTOPLASTY W/ TURBINOPLASTY Bilateral 03/22/2018   Procedure: NASAL SEPTOPLASTY WITH INFERIORTURBINATE REDUCTION;  Surgeon: Margaretha Sheffield, MD;  Location: [facility];  Service: ENT;  Laterality: Bilateral;  . NASOPHARYNGOSCOPY EUSTATION TUBE BALLOON DILATION Bilateral 03/22/2018   Procedure: NASOPHARYNGOSCOPY EUSTATION TUBE BALLOON DILATION;  Surgeon: Margaretha Sheffield, MD;  Location: [facility];  Service: ENT;  Laterality: Bilateral;  . RENAL BIOPSY     Active Ambulatory Problems    Diagnosis Date Noted  . Insomnia, persistent 09/03/2014  . Acid reflux 09/22/2014  . Bursitis, trochanteric 01/28/2014  . H/O malignant neoplasm of skin 08/20/2012  . 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 BPH (benign prostatic hypertrophy) with urinary obstruction 09/22/2014  . BPH with obstruction/lower urinary tract symptoms 11/08/2014  . Nocturia 11/08/2014  . Paroxysmal supraventricular tachycardia ([place]) 03/21/2017  . Hyperlipidemia 03/21/2017  . Seasonal affective disorder ([place]) 03/21/2017  . Caregiver  stress 03/22/2017  . Bradycardia 06/28/2016  . Bundle branch block,  right 12/31/2015  . 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 History of TIAs 04/02/2016  . Periodic heart flutter 11/06/2014  . Right arm pain 02/25/2016  . GAD (generalized anxiety disorder) 02/19/2018  . Chronic low back pain with bilateral sciatica 02/19/2018  . Primary osteoarthritis involving multiple joints 02/19/2018  . Seborrheic dermatitis of scalp 02/19/2018  . Middle cerebral artery stenosis, left 02/19/2018  . Chronic knee pain (Fifth Area of Pain) (Left) 03/21/2018  . Pes cavus 03/21/2018  . Chronic hip pain [facility] Area of Pain) (Bilateral) (L>R) 03/21/2018  . Minimal change disease 03/21/2018  . Personal history of skin cancer 08/20/2012  . Chronic pain syndrome 07/03/2018  . Chronic pain of lower extremity (Primary area of Pain) (Bilateral) (L>R) 07/03/2018  . Chronic buttock pain (Secondary Area of Pain) (Bilateral) (L>R) 07/03/2018  . Chronic thigh pain (Fourth Area of Pain) (Left) 07/03/2018  . Disorder of skeletal system 07/03/2018  . Pharmacologic therapy 07/03/2018  . Problems influencing health status 07/03/2018  . Enthesopathy of hip region on both sides 07/03/2018  . DDD (degenerative disc disease), lumbosacral 07/03/2018   Resolved Ambulatory Problems    Diagnosis Date Noted  . No Resolved Ambulatory Problems   Past Medical History:  Diagnosis Date  . Anxiety   . Arthritis   . Blood vessel disorder   . BPH (benign prostatic hyperplasia)   . GERD (gastroesophageal reflux disease)   . Insomnia   . PSVT (paroxysmal supraventricular tachycardia) ([place])   . TIA (transient ischemic attack)    Assessment  Primary Diagnosis & Pertinent Problem List: The primary encounter diagnosis was Chronic pain of lower extremity (Primary area of Pain) (Bilateral) (L>R). Diagnoses of Chronic pain syndrome, DDD (degenerative disc disease), lumbosacral, Chronic bilateral low back pain with  bilateral sciatica, Chronic buttock pain (Secondary Area of Pain) (Bilateral) (L>R), Chronic hip pain (Tertiary Area of Pain) (Bilateral) (L>R), Enthesopathy of hip region on both sides, Chronic thigh pain (Fourth Area of Pain) (Left), Chronic knee pain (Fifth Area of Pain) (Left), Disorder of skeletal system, Pharmacologic therapy, and Problems influencing health status were also pertinent to this visit.  Visit Diagnosis (New problems to examiner): 1. Chronic pain of lower extremity (Primary area of Pain) (Bilateral) (L>R)   2. Chronic pain syndrome   3. DDD (degenerative disc disease), lumbosacral   4. Chronic bilateral low back pain with bilateral sciatica   5. Chronic buttock pain (Secondary Area of Pain) (Bilateral) (L>R)   6. Chronic hip pain (Tertiary Area of Pain) (Bilateral) (L>R)   7. Enthesopathy of hip region on both sides   8. Chronic thigh pain (Fourth Area of Pain) (Left)   9. Chronic knee pain (Fifth Area of Pain) (Left)   10. Disorder of skeletal system   11. Pharmacologic therapy   12. Problems influencing health status    Plan of Care (Initial workup plan)  Note: Matthew Maldonado was reminded that as per protocol, today's visit has been an evaluation only. We have not taken over the patient's controlled substance management.  Problem-specific plan: No problem-specific Assessment & Plan notes found for this encounter.   Lab Orders     Compliance Drug Analysis, Ur     Comp. Metabolic Panel (12)     Magnesium     Vitamin B12     Sedimentation rate     25-Hydroxyvitamin D Lcms D2+D3     C-reactive protein  Imaging Orders     DG Lumbar Spine Complete W/Bend     DG  Knee 1-2 Views Left Referral Orders  No referral(s) requested today   Procedure Orders    No procedure(s) ordered today   Pharmacotherapy (current): Medications ordered:  No orders of the defined types were placed in this encounter.  Medications administered during this visit: Divante A. Russomanno had no  medications administered during this visit.   Pharmacological management options:  Opioid Analgesics: The patient was informed that there is no guarantee that he would be a candidate for opioid analgesics. The decision will be made following CDC guidelines. This decision will be based on the results of diagnostic studies, as well as Matthew Maldonado's risk profile.   Membrane stabilizer: To be determined at a later time  Muscle relaxant: To be determined at a later time  NSAID: To be determined at a later time  Other analgesic(s): To be determined at a later time   Interventional management options: Matthew Maldonado was informed that there is no guarantee that he would be a candidate for interventional therapies. The decision will be based on the results of diagnostic studies, as well as Matthew Maldonado's risk profile.  Procedure(s) under consideration:  Possible left-sided lumbar epidural steroid injection under fluoroscopic guidance and IV sedation. The patient has indicated that he was offered some epidural steroid injections by per Dr. Harlow Mares at the [facility].  I informed him of what my position he is regarding having other practices do the injections and halls prescribing the medications (in essence we do not do that).  He has indicated that he will talk to his primary care physician and then decide what his next step will be as he is not sure that he wants to have his pain managed by 2 different practices.  I tend to agree with this and therefore he has some decisions to be made.  Meanwhile, I have informed him that before I decide what kind of procedure I can offer him, I will have to complete a physical exam and also see the results of his MRIs.  I was also clear to him that because I have no evidence of any significant pathology, I do not have the indications to prescribe any controlled substances and this is the reason why we are to be doing some further x-rays and looking for the results of that MRI.   He understood and accepted.   Provider-requested follow-up: Return in about 3 weeks (around 07/24/2018) for 2nd Visit (Post-tests), (Virtual).  I spent over 45 minutes with this patient on the phone.  Future Appointments  Date Time Provider [place]  07/23/2018  9:30 AM [name], MD [facility] None    Primary Care Physician: Olin Hauser, DO Location: [facility] Note by: Gaspar Cola, MD Date: 07/03/2018; Time: 3:15 PM

## 2018-07-09 DIAGNOSIS — M5416 Radiculopathy, lumbar region: Secondary | ICD-10-CM | POA: Diagnosis not present

## 2018-07-14 ENCOUNTER — Other Ambulatory Visit: Payer: Self-pay | Admitting: Family Medicine

## 2018-07-14 DIAGNOSIS — F338 Other recurrent depressive disorders: Secondary | ICD-10-CM

## 2018-07-14 DIAGNOSIS — M5442 Lumbago with sciatica, left side: Principal | ICD-10-CM

## 2018-07-14 DIAGNOSIS — G8929 Other chronic pain: Secondary | ICD-10-CM

## 2018-07-14 DIAGNOSIS — F411 Generalized anxiety disorder: Secondary | ICD-10-CM

## 2018-07-14 DIAGNOSIS — M5441 Lumbago with sciatica, right side: Principal | ICD-10-CM

## 2018-07-16 ENCOUNTER — Telehealth: Payer: Self-pay | Admitting: Family Medicine

## 2018-07-16 DIAGNOSIS — G894 Chronic pain syndrome: Secondary | ICD-10-CM

## 2018-07-16 DIAGNOSIS — M5441 Lumbago with sciatica, right side: Secondary | ICD-10-CM

## 2018-07-16 DIAGNOSIS — M5442 Lumbago with sciatica, left side: Secondary | ICD-10-CM

## 2018-07-16 DIAGNOSIS — M150 Primary generalized (osteo)arthritis: Principal | ICD-10-CM

## 2018-07-16 DIAGNOSIS — M159 Polyosteoarthritis, unspecified: Secondary | ICD-10-CM

## 2018-07-16 DIAGNOSIS — G8929 Other chronic pain: Secondary | ICD-10-CM

## 2018-07-16 MED ORDER — OXYCODONE-ACETAMINOPHEN 5-325 MG PO TABS: 1.0000 | ORAL_TABLET | Freq: Three times a day (TID) | ORAL | 0 refills | Status: DC | PRN

## 2018-07-16 NOTE — Telephone Encounter (Signed)
I called patient to notify him about rx refill.  He provided additional updates.  His [facility] apt was moved forward to this Thursday. Not 5/14  He has seen [facility], had MRI Lumbar Spine L4-3, and L5-4. He received epidural injection with dramatic improvement for 3-4 days then pain worsening and gradually returned.  He has apt with [facility] again.  I refilled Lyrica 75 BID and Oxycodone today. He understands these are last rx from me on these, he will discuss with Pain Management.  Nobie Putnam, DO [facility] 07/16/2018, 4:20 PM

## 2018-07-16 NOTE — Telephone Encounter (Signed)
Pt.called and requested refill on oxycodone called into [facility]

## 2018-07-19 DIAGNOSIS — M5416 Radiculopathy, lumbar region: Secondary | ICD-10-CM | POA: Diagnosis not present

## 2018-07-23 ENCOUNTER — Ambulatory Visit: Payer: Medicare Other | Admitting: Pain Medicine

## 2018-07-25 ENCOUNTER — Encounter: Payer: Self-pay | Admitting: Pain Medicine

## 2018-07-25 NOTE — Progress Notes (Signed)
Patient's Name: Matthew Maldonado  MRN: [id number]  Referring Provider: Nobie Putnam *  DOB: 25-May-1950  PCP: Olin Hauser, DO  DOS: 07/26/2018  Note by: Gaspar Cola, MD  Service setting: Virtual Visit (Telephone)  Attending: Gaspar Cola, MD  Location: Telephone Encounter  Specialty: Interventional Pain Management  Patient type: Established   Pain Management Virtual Encounter Note - Virtual Visit via Telephone Telehealth (real-time audio visits between healthcare provider and patient).  Patient's Phone No.:  [phone] (home); [phone] (mobile); (Preferred) [phone] [email or web address]  [facility], [place] [address] Phone: [phone] Fax: [place], [place] [address] [place] [address] Phone: [phone] Fax: [phone]   Pre-screening note:  Our staff contacted [name] and offered him an "in person", "face-to-face" appointment versus a telephone encounter. He indicated preferring the telephone encounter, at this time.   Primary Reason(s) for Virtual Visit: Encounter for evaluation before starting new chronic pain management plan of care (Level of risk: moderate) COVID-19*  Social distancing based on CDC ans AMA recommendations.    I contacted [name] on 07/26/2018 at 2:34 PM via telephone.      I clearly identified myself as Gaspar Cola, MD. I verified that I was speaking with the correct person using two identifiers (Name and date of birth: 1951/03/08).  Advanced Informed Consent I sought verbal advanced consent from [name] for virtual visit interactions. I informed [name] of possible security and privacy concerns, risks, and limitations associated with providing "not-in-person" medical evaluation and management services. I also informed [name] of the availability of "in-person"  appointments. Finally, I informed him that there would be a charge for the virtual visit and that he could be  personally, fully or partially, financially responsible for it. [name] expressed understanding and agreed to proceed.   Historic Elements   [name] is a 68 y.o. year old, male patient evaluated today after his last encounter by our practice on 07/03/2018. [name]  has a past medical history of Anxiety, Arthritis, Blood vessel disorder, BPH (benign prostatic hyperplasia), GERD (gastroesophageal reflux disease), Insomnia, Minimal change disease, Nocturia, PSVT (paroxysmal supraventricular tachycardia) ([place]), and TIA (transient ischemic attack). He also  has a past surgical history that includes Renal biopsy; Nasal septoplasty w/ turbinoplasty (Bilateral, 03/22/2018); Nasopharyngoscopy eustation tube balloon dilation (Bilateral, 03/22/2018); Nasal endoscopy (Bilateral, 03/22/2018); and Excision nasal mass (Left, 03/22/2018). [name] has a current medication list which includes the following prescription(s): atorvastatin, buspirone, diclofenac sodium, esomeprazole, fluticasone, hydroxyzine, ketoconazole, metoprolol succinate, multivitamin with minerals, triamcinolone ointment, venlafaxine xr, cyclobenzaprine, meloxicam, oxycodone, and pregabalin. He  reports that he has quit smoking. His smoking use included cigars. He quit after 4.00 years of use. He uses smokeless tobacco. He reports current alcohol use of about 18.0 standard drinks of alcohol per week. He reports that he does not use drugs. [name] has No Known Allergies.   HPI  He is being evaluated for review of studies ordered on initial visit and to consider treatment plan options. Today I went over the results of his tests. These were explained in "Layman's terms". During today's appointment I went over my diagnostic impression, as well as the proposed treatment plan.  Today I contacted the patient and unfortunately he has not had any of  the blood work, UDS, or x-rays that I  have ordered.  In addition to this, he went over to [facility] where he had his lumbar epidural steroid injection.  I think he did not understand the information that I provided him a very initial visit.  In his mind, he thought that since [facility] he is an orthopedic practice and we are a pain management practice, that there was absolutely no overlap regarding our services.  Today I have clarified for him that when he comes to the interventional therapies such as the lumbar epidural steroid injections, there is overlap and we are not interested in taking the role of simply prescribing medications and letting other practices get involved in the interventional pain management portion of our practice.  Today I was very clear to him that should he come over, then we will take over not only his medication but we will also take over all of his interventional procedures, except for surgeries.   In addition, the patient apparently had a lumbar epidural steroid injection done at [facility], which is not available under [facility] or "Care Everywhere".  Today we will be requesting the results of the lumbar MRI and the lumbar epidural steroid injection.  According to the patient the epidural steroid injection was done without sedation, under fluoroscopic guidance, and on the left side.  He was unable to tell us the levels since he was not informed of that.  As to the results, he indicates that he had 100% relief of the pain for approximately 3 days and then after that the pain returned slowly.  He describes this pain as a burning sensation that is primarily located in the area of the left buttocks and going down through the back of the leg and knee to the level of the ankle, but he does not get into the foot.  More than a radiculopathy, this seems to follow a referred pain pattern.  Indicates understanding better how the system works and he has also indicated that he wants to  come over to our practice.  The first thing that we will do today is to make certain changes in terms of his medicines where he will be taking the Lyrica 50 mg in the morning and then 100 mg at night.  He currently takes 75 mg in the morning, but none at night.  He refers that his worst pain is at night but he was afraid to take the Lyrica could because he thought that it may not allow him to sleep.  He did mention that this 75 mg makes him feel a little cloudy and this is why we will be going to the 50 mg in the morning.  We will actually use the sedating effect of the Lyrica at night to help him sleep and to see if we can have him not take the opioid analgesic.  Currently he indicates taking oxycodone/APAP 5/325 1 tablet p.o. at bedtime and he refers that if after 12 hours he is not able to sleep then he will go ahead and take a second tablet.  Today I will also take over his Mobic and he indicates that he has not been taking the baclofen since he cannot see any benefit from it.  I have decided then to discontinue the baclofen and to start him on Flexeril 10 mg at bedtime to help him with the muscle component of his pain and also to take advantage of the sedating effect of the Flexeril so that he can sleep better.  I  did provide him with a prescription for oxycodone IR 5 mg to be taken 5 to 10 mg at bedtime, similarly to the way that he has been using it, but he was instructed to first try the Lyrica and the Flexeril by itself and if that did the trick, then not to use the opioid.  He understood and accepted.  He also indicated that he does not take the oxycodone during the day because he has to drive and he wants to make sure that none of the medicines will interfere with his ability to properly drive into his job.  Today I went ahead and explained to the patient that once I start prescribing the pain medicine for him he is not allowed to get it from anybody else.  I have provided him with information about our  medication policy and recommendations through the after visit summary.  He indicated that he does have access to "my chart".  In considering the treatment plan options, Mr. Kunkle was reminded that I no longer take patients for medication management only. I asked him to let me know if he had no intention of taking advantage of the interventional therapies, so that we could make arrangements to provide this space to someone interested. I also made it clear that undergoing interventional therapies for the purpose of getting pain medications is very inappropriate on the part of a patient, and it will not be tolerated in this practice. This type of behavior would suggest true addiction and therefore it requires referral to an addiction specialist.   I discussed the assessment and treatment plan with the patient. The patient was provided an opportunity to ask questions and all were answered. The patient agreed with the plan and demonstrated an understanding of the instructions.  Patient advised to call back or seek an in-person evaluation if the symptoms or condition worsens.  Controlled Substance Pharmacotherapy Assessment REMS (Risk Evaluation and Mitigation Strategy)  Analgesic: Oxycodone/APAP 5/325 1 to 2 tablets p.o. at bedtime (10 mg/day of oxycodone)  Monitoring: [place] PMP: PDMP reviewed during this encounter.       Not applicable at this point since we have not taken over the patient's medication management yet. List of other Serum/Urine Drug Screening Test(s):  No results found. List of all UDS test(s) done:  No results found. Last UDS on record: No results found. UDS interpretation: No unexpected findings.          Medication Assessment Form: Patient introduced to form today Treatment compliance: Treatment may start today if patient agrees with proposed plan. Evaluation of compliance is not applicable at this point Risk Assessment Profile: Aberrant behavior: See initial evaluations. None  observed or detected today Comorbid factors increasing risk of overdose: See initial evaluation. No additional risks detected today Opioid risk tool (ORT):  Opioid Risk  07/03/2018  Alcohol 0  Illegal Drugs 0  Rx Drugs 0  Alcohol 0  Illegal Drugs 0  Rx Drugs 0  Opioid Risk Tool Scoring 0  Opioid Risk Interpretation Low Risk    ORT Scoring interpretation table:  Score <3 = Low Risk for SUD  Score between 4-7 = Moderate Risk for SUD  Score >8 = High Risk for Opioid Abuse   Risk of substance use disorder (SUD): Low  Risk Mitigation Strategies:  Patient opioid safety counseling: Completed today. Counseling provided to patient as per "Patient Counseling Document". Document signed by patient, attesting to counseling and understanding Patient-Prescriber Agreement (PPA): Obtained today.  Controlled substance notification  to other providers: Written and sent today.  Pharmacologic Plan: Today we may be taking over the patient's pharmacological regimen. See below.             Meds   Current Outpatient Medications:  .  atorvastatin (LIPITOR) 80 MG tablet, TAKE 1 TABLET BY MOUTH ONCE DAILY AT 6 P.M., Disp: 90 tablet, Rfl: 1 .  busPIRone (BUSPAR) 15 MG tablet, TAKE 1 TABLET BY MOUTH 2 TIMES PER DAY., Disp: 180 tablet, Rfl: 1 .  diclofenac sodium (VOLTAREN) 1 % GEL, Apply 2 g topically daily as needed., Disp: 100 g, Rfl: 3 .  esomeprazole (NEXIUM) 20 MG capsule, TAKE 1 CAPSULE BY MOUTH ONCE DAILY AT 12NOON., Disp: 90 capsule, Rfl: 0 .  fluticasone (FLONASE) 50 MCG/ACT nasal spray, Place 2 sprays into both nostrils daily., Disp: 48 g, Rfl: 3 .  hydrOXYzine (VISTARIL) 25 MG capsule, Take 1-3 capsules (25-75 mg total) by mouth at bedtime as needed (for svere anxiety sx and sleep)., Disp: 270 capsule, Rfl: 1 .  ketoconazole (NIZORAL) 2 % shampoo, APPLY TOPICALLY AS NEEDED IRRITATION TO SCALP 1-2 TIMES A WEEK, Disp: 120 mL, Rfl: 1 .  metoprolol succinate (TOPROL-XL) 25 MG 24 hr tablet, Take 1 tablet  (25 mg total) by mouth daily., Disp: 90 tablet, Rfl: 1 .  Multiple Vitamins-Minerals (MULTIVITAMIN WITH MINERALS) tablet, Take 1 tablet by mouth daily., Disp: , Rfl:  .  triamcinolone ointment (KENALOG) 0.5 %, Apply 1 application topically 2 (two) times daily., Disp: 30 g, Rfl: 2 .  venlafaxine XR (EFFEXOR XR) 75 MG 24 hr capsule, Take 1 capsule (75 mg total) by mouth daily with breakfast., Disp: 90 capsule, Rfl: 1 .  cyclobenzaprine (FLEXERIL) 10 MG tablet, Take 1 tablet (10 mg total) by mouth at bedtime as needed for up to 30 days for muscle spasms., Disp: 30 tablet, Rfl: 0 .  meloxicam (MOBIC) 15 MG tablet, Take 1 tablet (15 mg total) by mouth daily for 30 days., Disp: 30 tablet, Rfl: 0 .  oxyCODONE (OXY IR/ROXICODONE) 5 MG immediate release tablet, Take 1-2 tablets (5-10 mg total) by mouth at bedtime as needed for up to 30 days for severe pain. Must last 30 days., Disp: 60 tablet, Rfl: 0 .  pregabalin (LYRICA) 50 MG capsule, Take 1 capsule (50 mg total) by mouth daily after breakfast AND 2 capsules (100 mg total) at bedtime. Do all this for 30 days., Disp: 90 capsule, Rfl: 0  Laboratory Chemistry  Inflammation Markers (CRP: Acute Phase) (ESR: Chronic Phase) No results found.  Rheumatology Markers No results found.  Renal Function Markers Lab Results  Component Value Date   BUN 23 09/06/2017   CREATININE 0.87 09/06/2017   BCR 26 (H) 09/06/2017   GFRAA 104 09/06/2017   GFRNONAA 90 09/06/2017                             Hepatic Function Markers Lab Results  Component Value Date   AST 41 (H) 09/06/2017   ALT 40 09/06/2017   ALBUMIN 4.9 (H) 09/06/2017   ALKPHOS 74 09/06/2017                        Electrolytes Lab Results  Component Value Date   NA 142 09/06/2017   K 4.4 09/06/2017   CL 105 09/06/2017   CALCIUM 9.5 09/06/2017  Neuropathy Markers No results found.  CNS Tests No results found.  Bone Pathology Markers No results  found.  Coagulation Parameters Lab Results  Component Value Date   PLT 207 03/21/2017                        Cardiovascular Markers Lab Results  Component Value Date   CKTOTAL 385 (H) 06/11/2011   CKMB 3.9 (H) 06/11/2011   TROPONINI <0.03 11/26/2015   HGB 14.8 03/21/2017   HCT 43.9 03/21/2017                         ID Markers No results found.  CA Markers No results found.  Endocrine Markers Lab Results  Component Value Date   TSH 1.770 03/21/2017   FREET4 0.74 11/26/2015                        Note: Lab results reviewed.  Recent Diagnostic Imaging Review  Lumbosacral Imaging: Lumbar DG (Complete) 4+V:  Results for orders placed during the hospital encounter of 10/16/17  DG Lumbar Spine Complete   Narrative CLINICAL DATA:  Chronic low back pain  EXAM: LUMBAR SPINE - COMPLETE 4+ VIEW  COMPARISON:  01/12/2011  FINDINGS: Five lumbar type vertebral bodies are well visualized. Mild osteophytic changes are noted. No pars defects are seen. Mild facet hypertrophic changes are seen. No soft tissue abnormality is noted.  IMPRESSION: Mild degenerative change without acute abnormality   Electronically Signed   By: Inez Catalina M.D.   On: 10/16/2017 14:42          Hip Imaging: Hip-R DG 2-3 views:  Results for orders placed during the hospital encounter of 10/17/17  DG HIP UNILAT WITH PELVIS 2-3 VIEWS RIGHT   Narrative CLINICAL DATA:  Pain after lifting.  EXAM: DG HIP (WITH OR WITHOUT PELVIS) 2-3V RIGHT  COMPARISON:  Lumbar spine series 10/16/2017. Lumbar spine series 01/04/2011.  FINDINGS: Degenerative changes lumbar spine and both hips. No acute bony abnormality identified. No evidence of fracture or dislocation. Tiny sclerotic density noted the proximal right femur, right femoral series suggested for further evaluation.  IMPRESSION: 1. Degenerative changes lumbar spine and both hips. No acute bony abnormality.  2. Tiny sclerotic density noted the  proximal right femur. Right femoral series suggested for further evaluation.   Electronically Signed   By: Marcello Moores  Register   On: 10/17/2017 16:52    Hip-L DG 2-3 views:  Results for orders placed during the hospital encounter of 10/17/17  DG HIP UNILAT WITH PELVIS 2-3 VIEWS LEFT   Narrative CLINICAL DATA:  Hip pain.  Low back pain.  EXAM: DG HIP (WITH OR WITHOUT PELVIS) 2-3V LEFT  COMPARISON:  No prior.  FINDINGS: No acute bony or joint abnormality identified. No evidence of fracture or dislocation. Degenerative changes left hip.  IMPRESSION: No acute bony abnormality identified. Degenerative changes left hip.   Electronically Signed   By: Marcello Moores  Register   On: 10/17/2017 16:49    Complexity Note: Imaging results reviewed. Results shared with Mr. Mincey, using Layman's terms.                         Assessment  The primary encounter diagnosis was Chronic pain of lower extremity (Primary area of Pain) (Bilateral) (L>R). Diagnoses of Chronic buttock pain (Secondary Area of Pain) (Bilateral) (L>R), Chronic hip pain (Tertiary Area of Pain) (  Bilateral) (L>R), Chronic thigh pain (Fourth Area of Pain) (Left), Chronic knee pain (Fifth Area of Pain) (Left), Chronic pain syndrome, Osteoarthritis involving multiple joints, Chronic musculoskeletal pain, Insomnia secondary to chronic pain, Neurogenic pain, and Chronic bilateral low back pain with bilateral sciatica were also pertinent to this visit.  Plan of Care  I have discontinued Lanny Hurst A. Halm's cyclobenzaprine, baclofen, meloxicam, pregabalin, and oxyCODONE-acetaminophen. I am also having him start on pregabalin, cyclobenzaprine, meloxicam, and oxyCODONE. Additionally, I am having him maintain his multivitamin with minerals, fluticasone, triamcinolone ointment, metoprolol succinate, hydrOXYzine, ketoconazole, diclofenac sodium, venlafaxine XR, atorvastatin, esomeprazole, and busPIRone. Pharmacotherapy (Medications Ordered): Meds  ordered this encounter  Medications  . pregabalin (LYRICA) 50 MG capsule    Sig: Take 1 capsule (50 mg total) by mouth daily after breakfast AND 2 capsules (100 mg total) at bedtime. Do all this for 30 days.    Dispense:  90 capsule    Refill:  0    Do not place this medication, or any other prescription from our practice, on "Automatic Refill". Patient may have prescription filled one day early if pharmacy is closed on scheduled refill date.  . cyclobenzaprine (FLEXERIL) 10 MG tablet    Sig: Take 1 tablet (10 mg total) by mouth at bedtime as needed for up to 30 days for muscle spasms.    Dispense:  30 tablet    Refill:  0    Do not place this medication, or any other prescription from our practice, on "Automatic Refill". Patient may have prescription filled one day early if pharmacy is closed on scheduled refill date.  . meloxicam (MOBIC) 15 MG tablet    Sig: Take 1 tablet (15 mg total) by mouth daily for 30 days.    Dispense:  30 tablet    Refill:  0    Do not add this medication to the electronic "Automatic Refill" notification system. Patient may have prescription filled one day early if pharmacy is closed on scheduled refill date.  Marland Kitchen oxyCODONE (OXY IR/ROXICODONE) 5 MG immediate release tablet    Sig: Take 1-2 tablets (5-10 mg total) by mouth at bedtime as needed for up to 30 days for severe pain. Must last 30 days.    Dispense:  60 tablet    Refill:  0    [place] STOP ACT - Not applicable to Chronic Pain Syndrome (G89.4) diagnosis. Fill one day early if pharmacy is closed on scheduled refill date. Do not fill until: 07/26/18. To last until: 08/25/18.   Procedure Orders    No procedure(s) ordered today   Lab Orders  No laboratory test(s) ordered today   Imaging Orders  No imaging studies ordered today   Referral Orders  No referral(s) requested today    Orders:  No orders of the defined types were placed in this encounter.  Pharmacological management options:  Opioid  Analgesics: Today I have discontinued his oxycodone/APAP and I will start him on oxycodone IR at exactly the same dose, but without the Tylenol. Membrane stabilizer: Today I have changed his Lyrica from 75 mg p.o. q. a.m. to 50 mg p.o. q. a.m. and 100 mg at bedtime. Muscle relaxant: Today I have discontinued the baclofen that he was actually not taking and I have replaced it with Flexeril 10 mg p.o. at bedtime to see if we can help him sleep better at night. NSAID: Today I have taken over his Mobic 15 mg p.o. daily in a.m. Other analgesic(s): To be determined at a later  time   Interventional management options: Planned, scheduled, and/or pending:    Once the COVID-19 restrictions are lifted, we will have the patient come in for a physical exam so that we can determine where some of this pain is coming from.  The pain seems to have a referred pattern, as opposed to a radicular one.   Considering:   Pending on the physical exam and the results from the patient's lumbar MRI   PRN Procedures:   None at this time   Total duration of non-face-to-face encounter: 40 minutes.  Follow-up plan:   Return in about 3 weeks (around 08/16/2018) for Med-Mgmt, (Virtual Visit).    Future Appointments  Date Time Provider [place]  08/15/2018 11:15 AM [name], MD [facility] None    Primary Care Physician: Olin Hauser, DO Location: Telephone Virtual Visit Note by: Gaspar Cola, MD Date: 07/26/2018; Time: 3:51 PM  Disclaimer:  * Given the special circumstances of the COVID-19 pandemic, the federal government has announced that the Office for Civil Rights (OCR) will exercise its enforcement discretion and will not impose penalties on physicians using telehealth in the event of noncompliance with regulatory requirements under the [place] and Accountability Act (HIPAA) in connection with the good faith provision of telehealth during the [place]FIL-35 national  public health emergency. ([place])

## 2018-07-26 ENCOUNTER — Ambulatory Visit: Payer: Medicare Other | Attending: Pain Medicine | Admitting: Pain Medicine

## 2018-07-26 ENCOUNTER — Other Ambulatory Visit: Payer: Self-pay

## 2018-07-26 DIAGNOSIS — M79605 Pain in left leg: Secondary | ICD-10-CM | POA: Diagnosis not present

## 2018-07-26 DIAGNOSIS — G8929 Other chronic pain: Secondary | ICD-10-CM | POA: Diagnosis not present

## 2018-07-26 DIAGNOSIS — M79652 Pain in left thigh: Secondary | ICD-10-CM

## 2018-07-26 DIAGNOSIS — M150 Primary generalized (osteo)arthritis: Secondary | ICD-10-CM | POA: Diagnosis not present

## 2018-07-26 DIAGNOSIS — G4701 Insomnia due to medical condition: Secondary | ICD-10-CM

## 2018-07-26 DIAGNOSIS — M25551 Pain in right hip: Secondary | ICD-10-CM | POA: Diagnosis not present

## 2018-07-26 DIAGNOSIS — M5442 Lumbago with sciatica, left side: Secondary | ICD-10-CM | POA: Diagnosis not present

## 2018-07-26 DIAGNOSIS — M5441 Lumbago with sciatica, right side: Secondary | ICD-10-CM

## 2018-07-26 DIAGNOSIS — M7918 Myalgia, other site: Secondary | ICD-10-CM | POA: Diagnosis not present

## 2018-07-26 DIAGNOSIS — M79604 Pain in right leg: Secondary | ICD-10-CM | POA: Diagnosis not present

## 2018-07-26 DIAGNOSIS — M159 Polyosteoarthritis, unspecified: Secondary | ICD-10-CM

## 2018-07-26 DIAGNOSIS — M25562 Pain in left knee: Secondary | ICD-10-CM | POA: Diagnosis not present

## 2018-07-26 DIAGNOSIS — G894 Chronic pain syndrome: Secondary | ICD-10-CM | POA: Diagnosis not present

## 2018-07-26 DIAGNOSIS — M25552 Pain in left hip: Secondary | ICD-10-CM

## 2018-07-26 DIAGNOSIS — M792 Neuralgia and neuritis, unspecified: Secondary | ICD-10-CM

## 2018-07-26 MED ORDER — CYCLOBENZAPRINE HCL 10 MG PO TABS: 10.0000 mg | ORAL_TABLET | Freq: Every evening | ORAL | 0 refills | Status: DC | PRN

## 2018-07-26 MED ORDER — PREGABALIN 50 MG PO CAPS: ORAL_CAPSULE | ORAL | 0 refills | Status: DC

## 2018-07-26 MED ORDER — OXYCODONE HCL 5 MG PO TABS: 5.0000 mg | ORAL_TABLET | Freq: Every evening | ORAL | 0 refills | Status: DC | PRN

## 2018-07-26 MED ORDER — MELOXICAM 15 MG PO TABS: 15.0000 mg | ORAL_TABLET | Freq: Every day | ORAL | 0 refills | Status: DC

## 2018-07-26 NOTE — Patient Instructions (Signed)
____________________________________________________________________________________________  Medication Rules  Purpose: To inform patients, and their family members, of our rules and regulations.  Applies to: All patients receiving prescriptions (written or electronic).  Pharmacy of record: Pharmacy where electronic prescriptions will be sent. If written prescriptions are taken to a different pharmacy, please inform the nursing staff. The pharmacy listed in the electronic medical record should be the one where you would like electronic prescriptions to be sent.  Electronic prescriptions: In compliance with the [place] Strengthen Opioid Misuse Prevention (STOP) Act of 2017 (Session Law 2017-74/H243), effective March 14, 2018, all controlled substances must be electronically prescribed. Calling prescriptions to the pharmacy will cease to exist.  Prescription refills: Only during scheduled appointments. Applies to all prescriptions.  NOTE: The following applies primarily to controlled substances (Opioid* Pain Medications).   Patient's responsibilities: 1. Pain Pills: Bring all pain pills to every appointment (except for procedure appointments). 2. Pill Bottles: Bring pills in original pharmacy bottle. Always bring the newest bottle. Bring bottle, even if empty. 3. Medication refills: You are responsible for knowing and keeping track of what medications you take and those you need refilled. The day before your appointment: write a list of all prescriptions that need to be refilled. The day of the appointment: give the list to the admitting nurse. Prescriptions will be written only during appointments. No prescriptions will be written on procedure days. If you forget a medication: it will not be "Called in", "Faxed", or "electronically sent". You will need to get another appointment to get these prescribed. No early refills. Do not call asking to have your prescription filled  early. 4. Prescription Accuracy: You are responsible for carefully inspecting your prescriptions before leaving our office. Have the discharge nurse carefully go over each prescription with you, before taking them home. Make sure that your name is accurately spelled, that your address is correct. Check the name and dose of your medication to make sure it is accurate. Check the number of pills, and the written instructions to make sure they are clear and accurate. Make sure that you are given enough medication to last until your next medication refill appointment. 5. Taking Medication: Take medication as prescribed. When it comes to controlled substances, taking less pills or less frequently than prescribed is permitted and encouraged. Never take more pills than instructed. Never take medication more frequently than prescribed.  6. Inform other Doctors: Always inform, all of your healthcare providers, of all the medications you take. 7. Pain Medication from other Providers: You are not allowed to accept any additional pain medication from any other Doctor or Healthcare provider. There are two exceptions to this rule. (see below) In the event that you require additional pain medication, you are responsible for notifying us, as stated below. 8. Medication Agreement: You are responsible for carefully reading and following our Medication Agreement. This must be signed before receiving any prescriptions from our practice. Safely store a copy of your signed Agreement. Violations to the Agreement will result in no further prescriptions. (Additional copies of our Medication Agreement are available upon request.) 9. Laws, Rules, & Regulations: All patients are expected to follow all Federal and State Laws, Statutes, Rules, & Regulations. Ignorance of the Laws does not constitute a valid excuse. The use of any illegal substances is prohibited. 10. Adopted CDC guidelines & recommendations: Target dosing levels will be  at or below 60 MME/day. Use of benzodiazepines** is not recommended.  Exceptions: There are only two exceptions to the rule of not   receiving pain medications from other Healthcare Providers. 1. Exception #1 (Emergencies): In the event of an emergency (i.e.: accident requiring emergency care), you are allowed to receive additional pain medication. However, you are responsible for: As soon as you are able, call our office [phone], at any time of the day or night, and leave a message stating your name, the date and nature of the emergency, and the name and dose of the medication prescribed. In the event that your call is answered by a member of our staff, make sure to document and save the date, time, and the name of the person that took your information.  2. Exception #2 (Planned Surgery): In the event that you are scheduled by another doctor or dentist to have any type of surgery or procedure, you are allowed (for a period no longer than 30 days), to receive additional pain medication, for the acute post-op pain. However, in this case, you are responsible for picking up a copy of our "Post-op Pain Management for Surgeons" handout, and giving it to your surgeon or dentist. This document is available at our office, and does not require an appointment to obtain it. Simply go to our office during business hours (Monday-Thursday from 8:00 AM to 4:00 PM) (Friday 8:00 AM to 12:00 Noon) or if you have a scheduled appointment with us, prior to your surgery, and ask for it by name. In addition, you will need to provide us with your name, name of your surgeon, type of surgery, and date of procedure or surgery.  *Opioid medications include: morphine, codeine, oxycodone, oxymorphone, hydrocodone, hydromorphone, meperidine, tramadol, tapentadol, buprenorphine, fentanyl, methadone. **Benzodiazepine medications include: diazepam (Valium), alprazolam (Xanax), clonazepam (Klonopine), lorazepam (Ativan), clorazepate  (Tranxene), chlordiazepoxide (Librium), estazolam (Prosom), oxazepam (Serax), temazepam (Restoril), triazolam (Halcion) (Last updated: 05/11/2017) ____________________________________________________________________________________________   ____________________________________________________________________________________________  Medication Recommendations and Reminders  Applies to: All patients receiving prescriptions (written and/or electronic).  Medication Rules & Regulations: These rules and regulations exist for your safety and that of others. They are not flexible and neither are we. Dismissing or ignoring them will be considered "non-compliance" with medication therapy, resulting in complete and irreversible termination of such therapy. (See document titled "Medication Rules" for more details.) In all conscience, because of safety reasons, we cannot continue providing a therapy where the patient does not follow instructions.  Pharmacy of record:   Definition: This is the pharmacy where your electronic prescriptions will be sent.   We do not endorse any particular pharmacy.  You are not restricted in your choice of pharmacy.  The pharmacy listed in the electronic medical record should be the one where you want electronic prescriptions to be sent.  If you choose to change pharmacy, simply notify our nursing staff of your choice of new pharmacy.  Recommendations:  Keep all of your pain medications in a safe place, under lock and key, even if you live alone.   After you fill your prescription, take 1 week's worth of pills and put them away in a safe place. You should keep a separate, properly labeled bottle for this purpose. The remainder should be kept in the original bottle. Use this as your primary supply, until it runs out. Once it's gone, then you know that you have 1 week's worth of medicine, and it is time to come in for a prescription refill. If you do this correctly, it  is unlikely that you will ever run out of medicine.  To make sure that the above recommendation works,   it is very important that you make sure your medication refill appointments are scheduled at least 1 week before you run out of medicine. To do this in an effective manner, make sure that you do not leave the office without scheduling your next medication management appointment. Always ask the nursing staff to show you in your prescription , when your medication will be running out. Then arrange for the receptionist to get you a return appointment, at least 7 days before you run out of medicine. Do not wait until you have 1 or 2 pills left, to come in. This is very poor planning and does not take into consideration that we may need to cancel appointments due to bad weather, sickness, or emergencies affecting our staff.  "Partial Fill": If for any reason your pharmacy does not have enough pills/tablets to completely fill or refill your prescription, do not allow for a "partial fill". You will need a separate prescription to fill the remaining amount, which we will not provide. If the reason for the partial fill is your insurance, you will need to talk to the pharmacist about payment alternatives for the remaining tablets, but again, do not accept a partial fill.  Prescription refills and/or changes in medication(s):   Prescription refills, and/or changes in dose or medication, will be conducted only during scheduled medication management appointments. (Applies to both, written and electronic prescriptions.)  No refills on procedure days. No medication will be changed or started on procedure days. No changes, adjustments, and/or refills will be conducted on a procedure day. Doing so will interfere with the diagnostic portion of the procedure.  No phone refills. No medications will be "called into the pharmacy".  No Fax refills.  No weekend refills.  No Holliday refills.  No after hours  refills.  Remember:  Business hours are:  Monday to Thursday 8:00 AM to 4:00 PM Provider's Schedule: Crystal King, NP - Appointments are:  Medication management: Monday to Thursday 8:00 AM to 4:00 PM Tahesha Skeet, MD - Appointments are:  Medication management: Monday and Wednesday 8:00 AM to 4:00 PM Procedure day: Tuesday and Thursday 7:30 AM to 4:00 PM Bilal Lateef, MD - Appointments are:  Medication management: Tuesday and Thursday 8:00 AM to 4:00 PM Procedure day: Monday and Wednesday 7:30 AM to 4:00 PM (Last update: 05/11/2017) ____________________________________________________________________________________________   ____________________________________________________________________________________________  CANNABIDIOL (AKA: CBD Oil or Pills)  Applies to: All patients receiving prescriptions of controlled substances (written and/or electronic).  General Information: Cannabidiol (CBD) was discovered in 1940. It is one of some 113 identified cannabinoids in cannabis (Marijuana) plants, accounting for up to 40% of the plant's extract. As of 2018, preliminary clinical research on cannabidiol included studies of anxiety, cognition, movement disorders, and pain.  Cannabidiol is consummed in multiple ways, including inhalation of cannabis smoke or vapor, as an aerosol spray into the cheek, and by mouth. It may be supplied as CBD oil containing CBD as the active ingredient (no added tetrahydrocannabinol (THC) or terpenes), a full-plant CBD-dominant hemp extract oil, capsules, dried cannabis, or as a liquid solution. CBD is thought not have the same psychoactivity as THC, and may affect the actions of THC. Studies suggest that CBD may interact with different biological targets, including cannabinoid receptors and other neurotransmitter receptors. As of 2018 the mechanism of action for its biological effects has not been determined.  In the [place], cannabidiol has a limited  approval by the Food and Drug Administration (FDA) for treatment of only two types   of epilepsy disorders. The side effects of long-term use of the drug include somnolence, decreased appetite, diarrhea, fatigue, malaise, weakness, sleeping problems, and others.  CBD remains a Schedule I drug prohibited for any use.  Legality: Some manufacturers ship CBD products nationally, an illegal action which the FDA has not enforced in 2018, with CBD remaining the subject of an FDA investigational new drug evaluation, and is not considered legal as a dietary supplement or food ingredient as of December 2018. Federal illegality has made it difficult historically to conduct research on CBD. CBD is openly sold in head shops and health food stores in some states where such sales have not been explicitly legalized.  Warning: Because it is not FDA approved for general use or treatment of pain, it is not required to undergo the same manufacturing controls as prescription drugs.  This means that the available cannabidiol (CBD) may be contaminated with THC.  If this is the case, it will trigger a positive urine drug screen (UDS) test for cannabinoids (Marijuana).  Because a positive UDS for illicit substances is a violation of our medication agreement, your opioid analgesics (pain medicine) may be permanently discontinued. (Last update: 06/01/2017) ____________________________________________________________________________________________    

## 2018-08-13 ENCOUNTER — Telehealth: Payer: Self-pay | Admitting: *Deleted

## 2018-08-13 ENCOUNTER — Encounter: Payer: Self-pay | Admitting: Pain Medicine

## 2018-08-14 ENCOUNTER — Other Ambulatory Visit: Payer: Self-pay

## 2018-08-14 ENCOUNTER — Ambulatory Visit: Payer: Medicare Other | Attending: Pain Medicine | Admitting: Pain Medicine

## 2018-08-14 DIAGNOSIS — M79605 Pain in left leg: Secondary | ICD-10-CM | POA: Diagnosis not present

## 2018-08-14 DIAGNOSIS — M79652 Pain in left thigh: Secondary | ICD-10-CM

## 2018-08-14 DIAGNOSIS — M79604 Pain in right leg: Secondary | ICD-10-CM

## 2018-08-14 DIAGNOSIS — M25551 Pain in right hip: Secondary | ICD-10-CM

## 2018-08-14 DIAGNOSIS — Z79899 Other long term (current) drug therapy: Secondary | ICD-10-CM | POA: Diagnosis not present

## 2018-08-14 DIAGNOSIS — M899 Disorder of bone, unspecified: Secondary | ICD-10-CM

## 2018-08-14 DIAGNOSIS — G894 Chronic pain syndrome: Secondary | ICD-10-CM | POA: Diagnosis not present

## 2018-08-14 DIAGNOSIS — M25552 Pain in left hip: Secondary | ICD-10-CM

## 2018-08-14 DIAGNOSIS — M792 Neuralgia and neuritis, unspecified: Secondary | ICD-10-CM | POA: Diagnosis not present

## 2018-08-14 DIAGNOSIS — M150 Primary generalized (osteo)arthritis: Secondary | ICD-10-CM

## 2018-08-14 DIAGNOSIS — G8929 Other chronic pain: Secondary | ICD-10-CM

## 2018-08-14 DIAGNOSIS — M7918 Myalgia, other site: Secondary | ICD-10-CM | POA: Diagnosis not present

## 2018-08-14 DIAGNOSIS — Z789 Other specified health status: Secondary | ICD-10-CM

## 2018-08-14 DIAGNOSIS — M159 Polyosteoarthritis, unspecified: Secondary | ICD-10-CM

## 2018-08-14 MED ORDER — MELOXICAM 15 MG PO TABS: 15.0000 mg | ORAL_TABLET | Freq: Every day | ORAL | 0 refills | Status: DC

## 2018-08-14 MED ORDER — OXYCODONE HCL 5 MG PO TABS: 5.0000 mg | ORAL_TABLET | Freq: Every evening | ORAL | 0 refills | Status: DC | PRN

## 2018-08-14 MED ORDER — CYCLOBENZAPRINE HCL 10 MG PO TABS: 10.0000 mg | ORAL_TABLET | Freq: Every evening | ORAL | 0 refills | Status: DC | PRN

## 2018-08-14 MED ORDER — PREGABALIN 50 MG PO CAPS: ORAL_CAPSULE | ORAL | 0 refills | Status: DC

## 2018-08-14 NOTE — Progress Notes (Signed)
Pain Management Virtual Encounter Note - Virtual Visit via Telephone Telehealth (real-time audio visits between healthcare provider and patient).  Patient's Phone No. & Preferred Pharmacy:  [phone] (home); [phone] (mobile); (Preferred) [phone] [email or web address]  [facility], [place] [address] Phone: [phone] Fax: [place], [place]. [place] [address] Phone: [phone] Fax: [phone]   Pre-screening note:  Our staff contacted Mr. Carneiro and offered him an "in person", "face-to-face" appointment versus a telephone encounter. He indicated preferring the telephone encounter, at this time.  Reason for Virtual Visit: COVID-19*  Social distancing based on CDC and AMA recommendations.   I contacted [name] on 08/14/2018 at 11:57 AM via telephone.      I clearly identified myself as Gaspar Cola, MD. I verified that I was speaking with the correct person using two identifiers (Name and date of birth: 1950/11/16).  Advanced Informed Consent I sought verbal advanced consent from [name] for virtual visit interactions. I informed Mr. Schleifer of possible security and privacy concerns, risks, and limitations associated with providing "not-in-person" medical evaluation and management services. I also informed Mr. Stroupe of the availability of "in-person" appointments. Finally, I informed him that there would be a charge for the virtual visit and that he could be  personally, fully or partially, financially responsible for it. Mr. Keeling expressed understanding and agreed to proceed.   Historic Elements   Mr. DESSIE DELCARLO is a 68 y.o. year old, male patient evaluated today after his last encounter by our practice on 08/13/2018. Mr. Files  has a past medical history of Anxiety, Arthritis, Blood vessel disorder, BPH (benign prostatic  hyperplasia), GERD (gastroesophageal reflux disease), Insomnia, Minimal change disease, Nocturia, PSVT (paroxysmal supraventricular tachycardia) ([place]), and TIA (transient ischemic attack). He also  has a past surgical history that includes Renal biopsy; Nasal septoplasty w/ turbinoplasty (Bilateral, 03/22/2018); Nasopharyngoscopy eustation tube balloon dilation (Bilateral, 03/22/2018); Nasal endoscopy (Bilateral, 03/22/2018); and Excision nasal mass (Left, 03/22/2018). Mr. Kissler has a current medication list which includes the following prescription(s): atorvastatin, buspirone, cyclobenzaprine, diclofenac sodium, esomeprazole, fluticasone, hydroxyzine, ketoconazole, meloxicam, metoprolol succinate, multivitamin with minerals, oxycodone, pregabalin, triamcinolone ointment, and venlafaxine xr. He  reports that he has quit smoking. His smoking use included cigars. He quit after 4.00 years of use. He uses smokeless tobacco. He reports current alcohol use of about 18.0 standard drinks of alcohol per week. He reports that he does not use drugs. Mr. Ferrin has No Known Allergies.   HPI  Today, he is being contacted for medication management.  The patient indicates doing well with his current medication regimen and he also indicates having seen an improvement.  At this point, the improvement is such that he is not feel that we need to change any of his medicines.  I have offered to increase the Lyrica, but emphasized that before, he says that he is doing well and he does not need it.  We still pending the results of the MRI that he had done at [facility], which we have been unable to obtain.  Today we will again make an effort to get those MRIs but I have asked the patient to see if he can call them and either have them send those to [place] or he can get a copy of them fax them himself.  The alternative is that he can  send the information to me via "MY CHART" system.  Pharmacotherapy Assessment  Analgesic: Oxycodone IR 5 mg 1 to 2  tablets p.o. at bedtime (10 mg/day of oxycodone) MME/day: 15 mg/day.   Monitoring: Pharmacotherapy: No side-effects or adverse reactions reported. [place] PMP: PDMP reviewed during this encounter.       Compliance: No problems identified. Effectiveness: Clinically acceptable. Plan: Refer to "POC".  Pertinent Labs  Renal Function Lab Results  Component Value Date   BUN 23 09/06/2017   CREATININE 0.87 09/06/2017   BCR 26 (H) 09/06/2017   GFRAA 104 09/06/2017   GFRNONAA 90 09/06/2017   Hepatic Function Lab Results  Component Value Date   AST 41 (H) 09/06/2017   ALT 40 09/06/2017   ALBUMIN 4.9 (H) 09/06/2017   UDS No results found for: SUMMARY Note: No results found under the [employer] electronic medical record  Recent imaging  CT FEMUR RIGHT WO CONTRAST CLINICAL DATA:  Right thigh pain for the past 2 months. No known injury. Small sclerotic lesion in the proximal right femur on x-ray.  EXAM: CT OF THE LOWER RIGHT EXTREMITY WITHOUT CONTRAST  TECHNIQUE: Multidetector CT imaging of the right lower extremity was performed according to the standard protocol.  COMPARISON:  Pelvis and right hip x-rays.  Dated October 17, 2017  FINDINGS: Bones/Joint/Cartilage  No acute fracture or dislocation. Mild right hip joint space narrowing superiorly with subchondral sclerosis, similar to prior x-rays. Mild degenerative changes of the pubic symphysis. No joint effusion.  No focal bone lesion. The sclerotic lesion seen on x-ray corresponds to a dystrophic calcification along the insertion of the gluteus maximus muscle just posterior to the proximal subtrochanteric femur, likely related to chronic tendinopathy or old injury.  Ligaments  Suboptimally assessed by CT.  Muscles and Tendons  Unremarkable.  No muscle atrophy.  Soft tissues  Unremarkable.  No soft tissue mass or fluid collection.  IMPRESSION: 1. No focal bone lesion. The sclerotic lesion seen on  x-ray corresponds to a dystrophic calcification along the insertion of the gluteus maximus muscle just posterior to the proximal subtrochanteric femur, likely related to chronic tendinopathy or old injury. 2. Mild right hip osteoarthritis.  Electronically Signed   By: Titus Dubin M.D.   On: 11/01/2017 09:11  Assessment  The primary encounter diagnosis was Chronic pain syndrome. Diagnoses of Chronic pain of lower extremity (Primary area of Pain) (Bilateral) (L>R), Chronic buttock pain (Secondary Area of Pain) (Bilateral) (L>R), Chronic hip pain (Tertiary Area of Pain) (Bilateral) (L>R), Chronic thigh pain (Fourth Area of Pain) (Left), Chronic musculoskeletal pain, Neurogenic pain, Osteoarthritis involving multiple joints, Pharmacologic therapy, Disorder of skeletal system, and Problems influencing health status were also pertinent to this visit.  Plan of Care  I am having Lanny Hurst A. Dolph maintain his multivitamin with minerals, fluticasone, triamcinolone ointment, metoprolol succinate, hydrOXYzine, ketoconazole, diclofenac sodium, venlafaxine XR, atorvastatin, esomeprazole, busPIRone, cyclobenzaprine, oxyCODONE, pregabalin, and meloxicam.  Pharmacotherapy (Medications Ordered): Meds ordered this encounter  Medications  . cyclobenzaprine (FLEXERIL) 10 MG tablet    Sig: Take 1 tablet (10 mg total) by mouth at bedtime as needed for up to 30 days for muscle spasms.    Dispense:  30 tablet    Refill:  0    Fill one day early if pharmacy is closed on scheduled refill date. May substitute for generic if available.  Marland Kitchen oxyCODONE (OXY IR/ROXICODONE) 5 MG immediate release tablet    Sig: Take 1-2 tablets (5-10 mg total) by mouth at bedtime as needed  for up to 30 days for severe pain. Must last 30 days.    Dispense:  60 tablet    Refill:  0    Chronic Pain: STOP Act - Not applicable. Fill 1 day early if closed on scheduled refill date. Do not fill until: 08/25/2018. To last until: 09/24/2018.  Instruct to avoid benzodiazepines within 8 hours of opioid.  . pregabalin (LYRICA) 50 MG capsule    Sig: Take 1 capsule (50 mg total) by mouth daily after breakfast AND 2 capsules (100 mg total) at bedtime. Do all this for 30 days.    Dispense:  90 capsule    Refill:  0    Fill one day early if pharmacy is closed on scheduled refill date. May substitute for generic if available.  . meloxicam (MOBIC) 15 MG tablet    Sig: Take 1 tablet (15 mg total) by mouth daily for 30 days.    Dispense:  30 tablet    Refill:  0    Fill one day early if pharmacy is closed on scheduled refill date. May substitute for generic if available.   Orders:  Orders Placed This Encounter  Procedures  . Compliance Drug Analysis, Ur    Volume: 30 ml(s). Minimum 3 ml of urine is needed. Document temperature of fresh sample. Indications: Long term (current) use of opiate analgesic (Z79.891) Test#: [id number] (Comprehensive Profile)  . Comp. Metabolic Panel (12)    With GFR. Indications: Chronic Pain Syndrome (G89.4) & Pharmacotherapy (X52.841)    Order Specific Question:   Has the patient fasted?    Answer:   No    Order Specific Question:   CC Results    Answer:   PCP-NURSE [[id number]]  . Magnesium    Indication: Pharmacologic therapy (U27.253)    Order Specific Question:   CC Results    Answer:   PCP-NURSE [[id number]]  . Vitamin B12    Indication: Pharmacologic therapy (K74.259).    Order Specific Question:   CC Results    Answer:   PCP-NURSE [[id number]]  . Sedimentation rate    Indication: Disorder of skeletal system (M89.9)    Order Specific Question:   CC Results    Answer:   PCP-NURSE [[id number]]  . 25-Hydroxyvitamin D Lcms D2+D3    Indication: Disorder of skeletal system (M89.9).    Order Specific Question:   CC Results    Answer:   PCP-NURSE [[id number]]  . C-reactive protein    Indication: Problems influencing health status (Z78.9)    Order Specific Question:   CC Results    Answer:   PCP-NURSE [[id number]]    Follow-up plan:   Return for (1 mo) Med-Mgmt, (Virtual Visit).    I discussed the assessment and treatment plan with the patient. The patient was provided an opportunity to ask questions and all were answered. The patient agreed with the plan and demonstrated an understanding of the instructions.  Patient advised to call back or seek an in-person evaluation if the symptoms or condition worsens.  Total duration of non-face-to-face encounter: 12 minutes.  Note by: Gaspar Cola, MD Date: 08/14/2018; Time: 12:12 PM  Note: This dictation was prepared with Dragon dictation. Any transcriptional errors that may result from this process are unintentional.  Disclaimer:  * Given the special circumstances of the COVID-19 pandemic, the federal government has announced that the Office for Civil Rights (OCR) will exercise its enforcement discretion and will not impose penalties on physicians using telehealth in the event of noncompliance with  regulatory requirements under the [employer] and Accountability Act (HIPAA) in connection with the good faith provision of telehealth during the DKSMM-40 national public health emergency. (AMA)

## 2018-08-15 ENCOUNTER — Ambulatory Visit: Payer: Medicare Other | Admitting: Pain Medicine

## 2018-09-11 ENCOUNTER — Encounter: Payer: Self-pay | Admitting: Pain Medicine

## 2018-09-12 ENCOUNTER — Ambulatory Visit: Payer: Medicare Other | Attending: Pain Medicine | Admitting: Pain Medicine

## 2018-09-12 ENCOUNTER — Other Ambulatory Visit: Payer: Self-pay

## 2018-09-12 DIAGNOSIS — M25562 Pain in left knee: Secondary | ICD-10-CM | POA: Diagnosis not present

## 2018-09-12 DIAGNOSIS — M79652 Pain in left thigh: Secondary | ICD-10-CM | POA: Diagnosis not present

## 2018-09-12 DIAGNOSIS — M5137 Other intervertebral disc degeneration, lumbosacral region: Secondary | ICD-10-CM | POA: Diagnosis not present

## 2018-09-12 DIAGNOSIS — M7918 Myalgia, other site: Secondary | ICD-10-CM | POA: Diagnosis not present

## 2018-09-12 DIAGNOSIS — G894 Chronic pain syndrome: Secondary | ICD-10-CM | POA: Diagnosis not present

## 2018-09-12 DIAGNOSIS — M79605 Pain in left leg: Secondary | ICD-10-CM | POA: Diagnosis not present

## 2018-09-12 DIAGNOSIS — M792 Neuralgia and neuritis, unspecified: Secondary | ICD-10-CM | POA: Diagnosis not present

## 2018-09-12 DIAGNOSIS — M25552 Pain in left hip: Secondary | ICD-10-CM

## 2018-09-12 DIAGNOSIS — M25561 Pain in right knee: Secondary | ICD-10-CM | POA: Diagnosis not present

## 2018-09-12 DIAGNOSIS — M150 Primary generalized (osteo)arthritis: Secondary | ICD-10-CM

## 2018-09-12 DIAGNOSIS — G8929 Other chronic pain: Secondary | ICD-10-CM

## 2018-09-12 DIAGNOSIS — M25551 Pain in right hip: Secondary | ICD-10-CM | POA: Diagnosis not present

## 2018-09-12 DIAGNOSIS — M79604 Pain in right leg: Secondary | ICD-10-CM | POA: Diagnosis not present

## 2018-09-12 DIAGNOSIS — M159 Polyosteoarthritis, unspecified: Secondary | ICD-10-CM

## 2018-09-12 MED ORDER — CYCLOBENZAPRINE HCL 10 MG PO TABS: 10.0000 mg | ORAL_TABLET | Freq: Every evening | ORAL | 0 refills | Status: DC | PRN

## 2018-09-12 MED ORDER — OXYCODONE HCL 5 MG PO TABS: 5.0000 mg | ORAL_TABLET | Freq: Every evening | ORAL | 0 refills | Status: DC | PRN

## 2018-09-12 MED ORDER — DICLOFENAC SODIUM 1 % TD GEL: 4.0000 g | Freq: Four times a day (QID) | TRANSDERMAL | 99 refills | Status: DC

## 2018-09-12 MED ORDER — MELOXICAM 15 MG PO TABS: 15.0000 mg | ORAL_TABLET | Freq: Every day | ORAL | 1 refills | Status: DC

## 2018-09-12 MED ORDER — PREGABALIN 50 MG PO CAPS: 100.0000 mg | ORAL_CAPSULE | Freq: Every day | ORAL | 1 refills | Status: DC

## 2018-09-12 NOTE — Progress Notes (Signed)
Pain Management Virtual Encounter Note - Virtual Visit via Telephone Telehealth (real-time audio visits between healthcare provider and patient).   Patient's Phone No. & Preferred Pharmacy:  [phone] (home); [phone] (mobile); (Preferred) [phone] [email or web address]  [place], [place] [address] Phone: [phone] Fax: [phone]    Pre-screening note:  Our staff contacted Mr. Neuman and offered him an "in person", "face-to-face" appointment versus a telephone encounter. He indicated preferring the telephone encounter, at this time.   Reason for Virtual Visit: COVID-19*  Social distancing based on CDC and AMA recommendations.   I contacted [name] on 09/12/2018 via telephone.      I clearly identified myself as Gaspar Cola, MD. I verified that I was speaking with the correct person using two identifiers (Name: AMY BELLOSO, and date of birth: 1950/12/14).  Advanced Informed Consent I sought verbal advanced consent from [name] for virtual visit interactions. I informed Mr. Graybeal of possible security and privacy concerns, risks, and limitations associated with providing "not-in-person" medical evaluation and management services. I also informed Mr. Frankson of the availability of "in-person" appointments. Finally, I informed him that there would be a charge for the virtual visit and that he could be  personally, fully or partially, financially responsible for it. Mr. Lucks expressed understanding and agreed to proceed.   Historic Elements   [name] is a 68 y.o. year old, male patient evaluated today after his last encounter by our practice on 08/14/2018. Mr. Grand  has a past medical history of Anxiety, Arthritis, Blood vessel disorder, BPH (benign prostatic hyperplasia), GERD (gastroesophageal reflux disease), Insomnia, Minimal change disease, Nocturia, PSVT (paroxysmal supraventricular tachycardia) ([place]),  and TIA (transient ischemic attack). He also  has a past surgical history that includes Renal biopsy; Nasal septoplasty w/ turbinoplasty (Bilateral, 03/22/2018); Nasopharyngoscopy eustation tube balloon dilation (Bilateral, 03/22/2018); Nasal endoscopy (Bilateral, 03/22/2018); and Excision nasal mass (Left, 03/22/2018). Mr. Faria has a current medication list which includes the following prescription(s): atorvastatin, buspirone, cyclobenzaprine, diclofenac sodium, esomeprazole, fluticasone, hydroxyzine, ketoconazole, meloxicam, multivitamin with minerals, oxycodone, oxycodone, pregabalin, triamcinolone ointment, venlafaxine xr, and metoprolol succinate. He  reports that he has quit smoking. His smoking use included cigars. He quit after 4.00 years of use. He uses smokeless tobacco. He reports current alcohol use of about 18.0 standard drinks of alcohol per week. He reports that he does not use drugs. Mr. Joslyn has No Known Allergies.  Past history included: epidural steroid injection, lumbar (PROC) - Dr. Cristy Folks for Left L3-L4-L5 esi. Follow up with Dr. Sharyne Richters or Juventino Slovak.   HPI  Today, he is being contacted for medication management.  The patient indicates doing well on his current medication regimen until approximately 10 days ago when his sciatica began to flareup again.  Today we have talked about several options including coming in for a left-sided lumbar epidural steroid injection under fluoroscopic guidance and IV sedation.  The patient indicates being interested in this and therefore we have schedule him to come in for his first LESI.  Today he dropped by the clinics and gave [place] a CD of his last lumbar MRI.  I went over the MRI and it would seem that there is L3-4 spinal stenosis.  However, it is not the best MRI imaging I have seen.  Sadly, there is no official report by a radiologist.  Pharmacotherapy Assessment  Analgesic: Oxycodone IR 5 mg 1 to 2 tablets p.o.  at bedtime (10 mg/day of  oxycodone) MME/day: 15 mg/day.   Monitoring: Pharmacotherapy: No side-effects or adverse reactions reported. [place] PMP: PDMP reviewed during this encounter.       Compliance: No problems identified. Effectiveness: Clinically acceptable. Plan: Refer to "POC".  Pertinent Labs   SAFETY SCREENING Profile No results found for: SARSCOV2NAA, COVIDSOURCE, STAPHAUREUS, MRSAPCR, HCVAB, HIV, PREGTESTUR Renal Function Lab Results  Component Value Date   BUN 23 09/06/2017   CREATININE 0.87 09/06/2017   BCR 26 (H) 09/06/2017   GFRAA 104 09/06/2017   GFRNONAA 90 09/06/2017   Hepatic Function Lab Results  Component Value Date   AST 41 (H) 09/06/2017   ALT 40 09/06/2017   ALBUMIN 4.9 (H) 09/06/2017   UDS No results found for: SUMMARY Note: Above Lab results reviewed.  Recent imaging  CT FEMUR RIGHT WO CONTRAST CLINICAL DATA:  Right thigh pain for the past 2 months. No known injury. Small sclerotic lesion in the proximal right femur on x-ray.  EXAM: CT OF THE LOWER RIGHT EXTREMITY WITHOUT CONTRAST  TECHNIQUE: Multidetector CT imaging of the right lower extremity was performed according to the standard protocol.  COMPARISON:  Pelvis and right hip x-rays.  Dated October 17, 2017  FINDINGS: Bones/Joint/Cartilage  No acute fracture or dislocation. Mild right hip joint space narrowing superiorly with subchondral sclerosis, similar to prior x-rays. Mild degenerative changes of the pubic symphysis. No joint effusion.  No focal bone lesion. The sclerotic lesion seen on x-ray corresponds to a dystrophic calcification along the insertion of the gluteus maximus muscle just posterior to the proximal subtrochanteric femur, likely related to chronic tendinopathy or old injury.  Ligaments  Suboptimally assessed by CT.  Muscles and Tendons  Unremarkable.  No muscle atrophy.  Soft tissues  Unremarkable.  No soft tissue mass or fluid collection.  IMPRESSION: 1. No focal bone lesion.  The sclerotic lesion seen on x-ray corresponds to a dystrophic calcification along the insertion of the gluteus maximus muscle just posterior to the proximal subtrochanteric femur, likely related to chronic tendinopathy or old injury. 2. Mild right hip osteoarthritis.  Electronically Signed   By: Titus Dubin M.D.   On: 11/01/2017 09:11  Assessment  The primary encounter diagnosis was Chronic pain of lower extremity (Primary area of Pain) (Bilateral) (L>R). Diagnoses of DDD (degenerative disc disease), lumbosacral, Chronic buttock pain (Secondary Area of Pain) (Bilateral) (L>R), Chronic hip pain (Tertiary Area of Pain) (Bilateral) (L>R), Chronic thigh pain (Fourth Area of Pain) (Left), Chronic knee pain (Fifth Area of Pain) (Left), Chronic pain syndrome, Chronic musculoskeletal pain, Primary osteoarthritis involving multiple joints, Chronic knee pain (Bilateral), Neurogenic pain, and Osteoarthritis involving multiple joints were also pertinent to this visit.  Plan of Care  I have discontinued Lanny Hurst A. Prewitt's diclofenac sodium. I have also changed his cyclobenzaprine, diclofenac sodium, pregabalin, meloxicam, and oxyCODONE. Additionally, I am having him start on oxyCODONE. Lastly, I am having him maintain his multivitamin with minerals, fluticasone, triamcinolone ointment, metoprolol succinate, hydrOXYzine, ketoconazole, venlafaxine XR, atorvastatin, esomeprazole, and busPIRone.  Pharmacotherapy (Medications Ordered): Meds ordered this encounter  Medications  . cyclobenzaprine (FLEXERIL) 10 MG tablet    Sig: Take 1 tablet (10 mg total) by mouth at bedtime as needed for muscle spasms.    Dispense:  30 tablet    Refill:  0    Fill one day early if pharmacy is closed on scheduled refill date. May substitute for generic if available.  . diclofenac sodium (VOLTAREN) 1 % GEL    Sig:  Apply 4 g topically 4 (four) times daily.    Dispense:  350 g    Refill:  PRN    Fill one day early if  pharmacy is closed on scheduled refill date. May substitute for generic if available.  . pregabalin (LYRICA) 50 MG capsule    Sig: Take 2 capsules (100 mg total) by mouth at bedtime.    Dispense:  60 capsule    Refill:  1    Fill one day early if pharmacy is closed on scheduled refill date. May substitute for generic if available.  . meloxicam (MOBIC) 15 MG tablet    Sig: Take 1 tablet (15 mg total) by mouth daily.    Dispense:  30 tablet    Refill:  1    Fill one day early if pharmacy is closed on scheduled refill date. May substitute for generic if available.  Marland Kitchen oxyCODONE (OXY IR/ROXICODONE) 5 MG immediate release tablet    Sig: Take 1-2 tablets (5-10 mg total) by mouth at bedtime as needed for severe pain. Must last 30 days.    Dispense:  60 tablet    Refill:  0    Chronic Pain: STOP Act - Not applicable. Fill 1 day early if closed on scheduled refill date. Do not fill until: 09/24/2018. To last until: 10/24/2018. Instruct to avoid benzodiazepines within 8 hours of opioid.  Marland Kitchen oxyCODONE (OXY IR/ROXICODONE) 5 MG immediate release tablet    Sig: Take 1-2 tablets (5-10 mg total) by mouth at bedtime as needed for severe pain. Must last 30 days.    Dispense:  60 tablet    Refill:  0    Chronic Pain: STOP Act - Not applicable. Fill 1 day early if closed on scheduled refill date. Do not fill until: 10/24/2018. To last until: 11/23/2018. Instruct to avoid benzodiazepines within 8 hours of opioid.   Orders:  Orders Placed This Encounter  Procedures  . Lumbar Epidural Injection    Standing Status:   Future    Standing Expiration Date:   10/13/2018    Scheduling Instructions:     Procedure: Interlaminar Lumbar Epidural Steroid injection (LESI)            Laterality: Left-sided     Sedation: Patient's choice.     Timeframe: ASAA    Order Specific Question:   Where will this procedure be performed?    Answer:   [facility]   Follow-up plan:   Return in 2 months (on 11/19/2018) for (VV),  E/M (MM), in addition, Procedure (w/ sedation): (L) LESI #1.      Interventional management options: Procedure(s) under consideration:  Diagnostic left-sided lumbar epidural steroid injection under fluoroscopic guidance and IV sedation.    Recent Visits Date Type Provider Dept  08/14/18 Office Visit [name], MD [facility]  07/26/18 Office Visit [name], MD [facility]  07/03/18 Office Visit [name], MD [facility]  Showing recent visits within past 90 days and meeting all other requirements   Today's Visits Date Type Provider Dept  09/12/18 Office Visit [name], MD [facility]  Showing today's visits and meeting all other requirements   Future Appointments No visits were found meeting these conditions.  Showing future appointments within next 90 days and meeting all other requirements   I discussed the assessment and treatment plan with the patient. The patient was provided an opportunity to ask questions and all were answered. The patient agreed with the plan  and demonstrated an understanding of the instructions.  Patient advised to call back or seek an in-person evaluation if the symptoms or condition worsens.  Total duration of non-face-to-face encounter: 15 minutes.  Note by: Gaspar Cola, MD Date: 09/12/2018; Time: 1:59 PM  Note: This dictation was prepared with Dragon dictation. Any transcriptional errors that may result from this process are unintentional.  Disclaimer:  * Given the special circumstances of the COVID-19 pandemic, the federal government has announced that the Office for Civil Rights (OCR) will exercise its enforcement discretion and will not impose penalties on physicians using telehealth in the event of noncompliance with regulatory requirements under the [place] (HIPAA) in connection with the good faith provision of telehealth  during the AREQJ-48 national public health emergency. ([place])

## 2018-09-12 NOTE — Patient Instructions (Signed)

## 2018-09-13 ENCOUNTER — Other Ambulatory Visit: Payer: Self-pay | Admitting: Family Medicine

## 2018-09-17 ENCOUNTER — Other Ambulatory Visit: Payer: Self-pay | Admitting: Family Medicine

## 2018-09-21 ENCOUNTER — Other Ambulatory Visit
Admission: RE | Admit: 2018-09-21 | Discharge: 2018-09-21 | Disposition: A | Discharge status: Home / Self Care | Payer: Medicare Other | Source: Ambulatory Visit | Attending: Pain Medicine | Admitting: Pain Medicine

## 2018-09-21 ENCOUNTER — Other Ambulatory Visit: Payer: Self-pay

## 2018-09-21 DIAGNOSIS — Z1159 Encounter for screening for other viral diseases: Secondary | ICD-10-CM | POA: Insufficient documentation

## 2018-09-21 DIAGNOSIS — Z01812 Encounter for preprocedural laboratory examination: Secondary | ICD-10-CM | POA: Insufficient documentation

## 2018-09-22 LAB — SARS CORONAVIRUS 2 (TAT 6-24 HRS): SARS Coronavirus 2: NEGATIVE

## 2018-09-24 ENCOUNTER — Other Ambulatory Visit: Payer: Self-pay | Admitting: Pain Medicine

## 2018-09-24 DIAGNOSIS — G894 Chronic pain syndrome: Secondary | ICD-10-CM

## 2018-09-25 ENCOUNTER — Other Ambulatory Visit: Payer: Self-pay

## 2018-09-25 ENCOUNTER — Encounter: Payer: Self-pay | Admitting: Pain Medicine

## 2018-09-25 ENCOUNTER — Ambulatory Visit (HOSPITAL_BASED_OUTPATIENT_CLINIC_OR_DEPARTMENT_OTHER): Payer: Medicare Other | Admitting: Pain Medicine

## 2018-09-25 ENCOUNTER — Ambulatory Visit
Admission: RE | Admit: 2018-09-25 | Discharge: 2018-09-25 | Disposition: A | Discharge status: Home / Self Care | Payer: Medicare Other | Source: Ambulatory Visit | Attending: Pain Medicine | Admitting: Pain Medicine

## 2018-09-25 VITALS — BP 111/67 | HR 68 | Temp 98.1°F | Resp 15 | Ht 69.0 in | Wt 195.0 lb

## 2018-09-25 DIAGNOSIS — G8929 Other chronic pain: Secondary | ICD-10-CM | POA: Insufficient documentation

## 2018-09-25 DIAGNOSIS — M5441 Lumbago with sciatica, right side: Secondary | ICD-10-CM | POA: Diagnosis not present

## 2018-09-25 DIAGNOSIS — M5442 Lumbago with sciatica, left side: Secondary | ICD-10-CM | POA: Insufficient documentation

## 2018-09-25 DIAGNOSIS — M79605 Pain in left leg: Secondary | ICD-10-CM

## 2018-09-25 DIAGNOSIS — M5137 Other intervertebral disc degeneration, lumbosacral region: Secondary | ICD-10-CM

## 2018-09-25 DIAGNOSIS — M5416 Radiculopathy, lumbar region: Secondary | ICD-10-CM | POA: Insufficient documentation

## 2018-09-25 DIAGNOSIS — M79604 Pain in right leg: Secondary | ICD-10-CM | POA: Diagnosis not present

## 2018-09-25 MED ORDER — IOHEXOL 180 MG/ML  SOLN
10.0000 mL | Freq: Once | INTRAMUSCULAR | Status: AC
  Administered 2018-09-25: 10 mL via EPIDURAL

## 2018-09-25 MED ORDER — LIDOCAINE HCL 2 % IJ SOLN
20.0000 mL | Freq: Once | INTRAMUSCULAR | Status: AC
  Administered 2018-09-25: 13:00:00 400 mg

## 2018-09-25 MED ORDER — TRIAMCINOLONE ACETONIDE 40 MG/ML IJ SUSP
40.0000 mg | Freq: Once | INTRAMUSCULAR | Status: AC
  Administered 2018-09-25: 40 mg
  Filled 2018-09-25: qty 1

## 2018-09-25 MED ORDER — SODIUM CHLORIDE 0.9% FLUSH
2.0000 mL | Freq: Once | INTRAVENOUS | Status: AC
  Administered 2018-09-25: 14:00:00 2 mL

## 2018-09-25 MED ORDER — ROPIVACAINE HCL 2 MG/ML IJ SOLN
2.0000 mL | Freq: Once | INTRAMUSCULAR | Status: AC
  Administered 2018-09-25: 2 mL via EPIDURAL
  Filled 2018-09-25: qty 10

## 2018-09-25 NOTE — Patient Instructions (Addendum)
____________________________________________________________________________________________  Post-Procedure Discharge Instructions  Instructions:  Apply ice:   Purpose: This will minimize any swelling and discomfort after procedure.   When: Day of procedure, as soon as you get home.  How: Fill a plastic sandwich bag with crushed ice. Cover it with a small towel and apply to injection site.  How long: (15 min on, 15 min off) Apply for 15 minutes then remove x 15 minutes.  Repeat sequence on day of procedure, until you go to bed.  Apply heat:   Purpose: To treat any soreness and discomfort from the procedure.  When: Starting the next day after the procedure.  How: Apply heat to procedure site starting the day following the procedure.  How long: May continue to repeat daily, until discomfort goes away.  Food intake: Start with clear liquids (like water) and advance to regular food, as tolerated.   Physical activities: Keep activities to a minimum for the first 8 hours after the procedure. After that, then as tolerated.  Driving: If you have received any sedation, be responsible and do not drive. You are not allowed to drive for 24 hours after having sedation.  Blood thinner: (Applies only to those taking blood thinners) You may restart your blood thinner 6 hours after your procedure.  Insulin: (Applies only to Diabetic patients taking insulin) As soon as you can eat, you may resume your normal dosing schedule.  Infection prevention: Keep procedure site clean and dry. Shower daily and clean area with soap and water.  Post-procedure Pain Diary: Extremely important that this be done correctly and accurately. Recorded information will be used to determine the next step in treatment. For the purpose of accuracy, follow these rules:  Evaluate only the area treated. Do not report or include pain from an untreated area. For the purpose of this evaluation, ignore all other areas of pain,  except for the treated area.  After your procedure, avoid taking a long nap and attempting to complete the pain diary after you wake up. Instead, set your alarm clock to go off every hour, on the hour, for the initial 8 hours after the procedure. Document the duration of the numbing medicine, and the relief you are getting from it.  Do not go to sleep and attempt to complete it later. It will not be accurate. If you received sedation, it is likely that you were given a medication that may cause amnesia. Because of this, completing the diary at a later time may cause the information to be inaccurate. This information is needed to plan your care.  Follow-up appointment: Keep your post-procedure follow-up evaluation appointment after the procedure (usually 2 weeks for most procedures, 6 weeks for radiofrequencies). DO NOT FORGET to bring you pain diary with you.   Expect: (What should I expect to see with my procedure?)  From numbing medicine (AKA: Local Anesthetics): Numbness or decrease in pain. You may also experience some weakness, which if present, could last for the duration of the local anesthetic.  Onset: Full effect within 15 minutes of injected.  Duration: It will depend on the type of local anesthetic used. On the average, 1 to 8 hours.   From steroids (Applies only if steroids were used): Decrease in swelling or inflammation. Once inflammation is improved, relief of the pain will follow.  Onset of benefits: Depends on the amount of swelling present. The more swelling, the longer it will take for the benefits to be seen. In some cases, up to 10 days.    Duration: Steroids will stay in the system x 2 weeks. Duration of benefits will depend on multiple posibilities including persistent irritating factors.  Side-effects: If present, they may typically last 2 weeks (the duration of the steroids).  Frequent: Cramps (if they occur, drink Gatorade and take over-the-counter Magnesium 450-500 mg  once to twice a day); water retention with temporary weight gain; increases in blood sugar; decreased immune system response; increased appetite.  Occasional: Facial flushing (red, warm cheeks); mood swings; menstrual changes.  Uncommon: Long-term decrease or suppression of natural hormones; bone thinning. (These are more common with higher doses or more frequent use. This is why we prefer that our patients avoid having any injection therapies in other practices.)   Very Rare: Severe mood changes; psychosis; aseptic necrosis.  From procedure: Some discomfort is to be expected once the numbing medicine wears off. This should be minimal if ice and heat are applied as instructed.  Call if: (When should I call?)  You experience numbness and weakness that gets worse with time, as opposed to wearing off.  New onset bowel or bladder incontinence. (Applies only to procedures done in the spine)  Emergency Numbers:  Durning business hours (Monday - Thursday, 8:00 AM - 4:00 PM) (Friday, 9:00 AM - 12:00 Noon): [phone]  After hours: [phone]  NOTE: If you are having a problem and are unable connect with, or to talk to a provider, then go to your nearest urgent care or emergency department. If the problem is serious and urgent, please call 911. ____________________________________________________________________________________________   Pain Management Discharge Instructions  General Discharge Instructions :  If you need to reach your doctor call: Monday-Friday 8:00 am - 4:00 pm at [phone] or toll free [phone].  After clinic hours [phone] to have operator reach doctor.  Bring all of your medication bottles to all your appointments in the pain clinic.  To cancel or reschedule your appointment with Pain Management please remember to call 24 hours in advance to avoid a fee.  Refer to the educational materials which you have been given on: General Risks, I had my  Procedure. Discharge Instructions, Post Sedation.  Post Procedure Instructions:  The drugs you were given will stay in your system until tomorrow, so for the next 24 hours you should not drive, make any legal decisions or drink any alcoholic beverages.  You may eat anything you prefer, but it is better to start with liquids then soups and crackers, and gradually work up to solid foods.  Please notify your doctor immediately if you have any unusual bleeding, trouble breathing or pain that is not related to your normal pain.  Depending on the type of procedure that was done, some parts of your body may feel week and/or numb.  This usually clears up by tonight or the next day.  Walk with the use of an assistive device or accompanied by an adult for the 24 hours.  You may use ice on the affected area for the first 24 hours.  Put ice in a Ziploc bag and cover with a towel and place against area 15 minutes on 15 minutes off.  You may switch to heat after 24 hours.Epidural Steroid Injection Patient Information  Description: The epidural space surrounds the nerves as they exit the spinal cord.  In some patients, the nerves can be compressed and inflamed by a bulging disc or a tight spinal canal (spinal stenosis).  By injecting steroids into the epidural space, we can bring irritated nerves   into direct contact with a potentially helpful medication.  These steroids act directly on the irritated nerves and can reduce swelling and inflammation which often leads to decreased pain.  Epidural steroids may be injected anywhere along the spine and from the neck to the low back depending upon the location of your pain.   After numbing the skin with local anesthetic (like Novocaine), a small needle is passed into the epidural space slowly.  You may experience a sensation of pressure while this is being done.  The entire block usually last less than 10 minutes.  Conditions which may be treated by epidural  steroids:   Low back and leg pain  Neck and arm pain  Spinal stenosis  Post-laminectomy syndrome  Herpes zoster (shingles) pain  Pain from compression fractures  Preparation for the injection:  1. Do not eat any solid food or dairy products within 8 hours of your appointment.  2. You may drink clear liquids up to 3 hours before appointment.  Clear liquids include water, black coffee, juice or soda.  No milk or cream please. 3. You may take your regular medication, including pain medications, with a sip of water before your appointment  Diabetics should hold regular insulin (if taken separately) and take 1/2 normal NPH dos the morning of the procedure.  Carry some sugar containing items with you to your appointment. 4. A driver must accompany you and be prepared to drive you home after your procedure.  5. Bring all your current medications with your. 6. An IV may be inserted and sedation may be given at the discretion of the physician.   7. A blood pressure cuff, EKG and other monitors will often be applied during the procedure.  Some patients may need to have extra oxygen administered for a short period. 8. You will be asked to provide medical information, including your allergies, prior to the procedure.  We must know immediately if you are taking blood thinners (like Coumadin/Warfarin)  Or if you are allergic to IV iodine contrast (dye). We must know if you could possible be pregnant.  Possible side-effects:  Bleeding from needle site  Infection (rare, may require surgery)  Nerve injury (rare)  Numbness & tingling (temporary)  Difficulty urinating (rare, temporary)  Spinal headache ( a headache worse with upright posture)  Light -headedness (temporary)  Pain at injection site (several days)  Decreased blood pressure (temporary)  Weakness in arm/leg (temporary)  Pressure sensation in back/neck (temporary)  Call if you experience:  Fever/chills associated with  headache or increased back/neck pain.  Headache worsened by an upright position.  New onset weakness or numbness of an extremity below the injection site  Hives or difficulty breathing (go to the emergency room)  Inflammation or drainage at the infection site  Severe back/neck pain  Any new symptoms which are concerning to you  Please note:  Although the local anesthetic injected can often make your back or neck feel good for several hours after the injection, the pain will likely return.  It takes 3-7 days for steroids to work in the epidural space.  You may not notice any pain relief for at least that one week.  If effective, we will often do a series of three injections spaced 3-6 weeks apart to maximally decrease your pain.  After the initial series, we generally will wait several months before considering a repeat injection of the same type.  If you have any questions, please call [phone] [facility] 

## 2018-09-25 NOTE — Progress Notes (Signed)
Patient's Name: Matthew Maldonado  MRN: [id number]  Referring Provider: [name], MD  DOB: [birth year]  PCP: Olin Hauser, DO  DOS: 09/25/2018  Note by: Gaspar Cola, MD  Service setting: Ambulatory outpatient  Specialty: Interventional Pain Management  Patient type: Established  Location: [facility] (AMB) Pain Management Facility  Visit type: Interventional Procedure   Primary Reason for Visit: Interventional Pain Management Treatment. CC: Back Pain (lower)  Procedure:          Anesthesia, Analgesia, Anxiolysis:  Type: Therapeutic Inter-Laminar Epidural Steroid Injection  #1  Region: Lumbar Level: L3-4 Level. Laterality: Left-Sided Paramedial  Type: Moderate (Conscious) Sedation combined with Local Anesthesia Indication(s): Analgesia and Anxiety Route: Intravenous (IV) IV Access: Secured Sedation: Meaningful verbal contact was maintained at all times during the procedure  Local Anesthetic: Lidocaine 1-2%  Position: Prone with head of the table was raised to facilitate breathing.   Indications: 1. DDD (degenerative disc disease), lumbosacral   2. Chronic low back pain (Bilateral) w/ sciatica (Bilateral) (L>R)   3. Chronic pain of lower extremity (Primary area of Pain) (Bilateral) (L>R)   4. Chronic lumbar radiculopathy (Left)    Pain Score: Pre-procedure: 5 /10 Post-procedure: 0-No pain/10  Pre-op Assessment:  Matthew Maldonado is a 68 y.o. (year old), male patient, seen today for interventional treatment. He  has a past surgical history that includes Renal biopsy; Nasal septoplasty w/ turbinoplasty (Bilateral, 03/22/2018); Nasopharyngoscopy eustation tube balloon dilation (Bilateral, 03/22/2018); Nasal endoscopy (Bilateral, 03/22/2018); and Excision nasal mass (Left, 03/22/2018). Matthew Maldonado has a current medication list which includes the following prescription(s): atorvastatin, buspirone, cyclobenzaprine, diclofenac sodium, esomeprazole, fluticasone, hydroxyzine, ketoconazole,  meloxicam, multivitamin with minerals, oxycodone, oxycodone, pregabalin, triamcinolone ointment, venlafaxine xr, and metoprolol succinate. His primarily concern today is the Back Pain (lower)  Initial Vital Signs:  Pulse/HCG Rate: 68ECG Heart Rate: 64 Temp: 98.1 F (36.7 C) Resp: 16 BP: (!) 109/93 SpO2: 100 %  BMI: Estimated body mass index is 28.8 kg/m as calculated from the following:   Height as of this encounter: 5\' 9"  (1.753 m).   Weight as of this encounter: 195 lb (88.5 kg).  Risk Assessment: Allergies: Reviewed. He has No Known Allergies.  Allergy Precautions: None required Coagulopathies: Reviewed. None identified.  Blood-thinner therapy: None at this time Active Infection(s): Reviewed. None identified. Matthew Maldonado is afebrile  Site Confirmation: Matthew Maldonado was asked to confirm the procedure and laterality before marking the site Procedure checklist: Completed Consent: Before the procedure and under the influence of no sedative(s), amnesic(s), or anxiolytics, the patient was informed of the treatment options, risks and possible complications. To fulfill our ethical and legal obligations, as recommended by the American Medical Association's Code of Ethics, I have informed the patient of my clinical impression; the nature and purpose of the treatment or procedure; the risks, benefits, and possible complications of the intervention; the alternatives, including doing nothing; the risk(s) and benefit(s) of the alternative treatment(s) or procedure(s); and the risk(s) and benefit(s) of doing nothing. The patient was provided information about the general risks and possible complications associated with the procedure. These may include, but are not limited to: failure to achieve desired goals, infection, bleeding, organ or nerve damage, allergic reactions, paralysis, and death. In addition, the patient was informed of those risks and complications associated to Spine-related procedures, such  as failure to decrease pain; infection (i.e.: Meningitis, epidural or intraspinal abscess); bleeding (i.e.: epidural hematoma, subarachnoid hemorrhage, or any other type of intraspinal or peri-dural bleeding); organ or nerve  damage (i.e.: Any type of peripheral nerve, nerve root, or spinal cord injury) with subsequent damage to sensory, motor, and/or autonomic systems, resulting in permanent pain, numbness, and/or weakness of one or several areas of the body; allergic reactions; (i.e.: anaphylactic reaction); and/or death. Furthermore, the patient was informed of those risks and complications associated with the medications. These include, but are not limited to: allergic reactions (i.e.: anaphylactic or anaphylactoid reaction(s)); adrenal axis suppression; blood sugar elevation that in diabetics may result in ketoacidosis or comma; water retention that in patients with history of congestive heart failure may result in shortness of breath, pulmonary edema, and decompensation with resultant heart failure; weight gain; swelling or edema; medication-induced neural toxicity; particulate matter embolism and blood vessel occlusion with resultant organ, and/or nervous system infarction; and/or aseptic necrosis of one or more joints. Finally, the patient was informed that Medicine is not an exact science; therefore, there is also the possibility of unforeseen or unpredictable risks and/or possible complications that may result in a catastrophic outcome. The patient indicated having understood very clearly. We have given the patient no guarantees and we have made no promises. Enough time was given to the patient to ask questions, all of which were answered to the patient's satisfaction. Matthew Maldonado has indicated that he wanted to continue with the procedure. Attestation: I, the ordering provider, attest that I have discussed with the patient the benefits, risks, side-effects, alternatives, likelihood of achieving goals, and  potential problems during recovery for the procedure that I have provided informed consent. Date  Time: 09/25/2018 12:50 PM  Pre-Procedure Preparation:  Monitoring: As per clinic protocol. Respiration, ETCO2, SpO2, BP, heart rate and rhythm monitor placed and checked for adequate function Safety Precautions: Patient was assessed for positional comfort and pressure points before starting the procedure. Time-out: I initiated and conducted the "Time-out" before starting the procedure, as per protocol. The patient was asked to participate by confirming the accuracy of the "Time Out" information. Verification of the correct person, site, and procedure were performed and confirmed by me, the nursing staff, and the patient. "Time-out" conducted as per Joint Commission's Universal Protocol (UP.01.01.01). Time: 1332  Description of Procedure:          Target Area: The interlaminar space, initially targeting the lower laminar border of the superior vertebral body. Approach: Paramedial approach. Area Prepped: Entire Posterior Lumbar Region Prepping solution: DuraPrep (Iodine Povacrylex [0.7% available iodine] and Isopropyl Alcohol, 74% w/w) Safety Precautions: Aspiration looking for blood return was conducted prior to all injections. At no point did we inject any substances, as a needle was being advanced. No attempts were made at seeking any paresthesias. Safe injection practices and needle disposal techniques used. Medications properly checked for expiration dates. SDV (single dose vial) medications used. Description of the Procedure: Protocol guidelines were followed. The procedure needle was introduced through the skin, ipsilateral to the reported pain, and advanced to the target area. Bone was contacted and the needle walked caudad, until the lamina was cleared. The epidural space was identified using "loss-of-resistance technique" with 2-3 ml of PF-NaCl (0.9% NSS), in a 5cc LOR glass syringe.  Vitals:    09/25/18 1249 09/25/18 1328 09/25/18 1333 09/25/18 1338  BP: (!) 109/93 119/82 109/82 111/67  Pulse: 68     Resp: 16 12 14 15   Temp: 98.1 F (36.7 C)     SpO2: 100% 98% 97% 98%  Weight: 195 lb (88.5 kg)     Height: 5\' 9"  (1.753 m)  Start Time: 1332 hrs. End Time: 1338 hrs.  Materials:  Needle(s) Type: Epidural needle Gauge: 17G Length: 3.5-in Medication(s): Please see orders for medications and dosing details.  Imaging Guidance (Spinal):          Type of Imaging Technique: Fluoroscopy Guidance (Spinal) Indication(s): Assistance in needle guidance and placement for procedures requiring needle placement in or near specific anatomical locations not easily accessible without such assistance. Exposure Time: Please see nurses notes. Contrast: Before injecting any contrast, we confirmed that the patient did not have an allergy to iodine, shellfish, or radiological contrast. Once satisfactory needle placement was completed at the desired level, radiological contrast was injected. Contrast injected under live fluoroscopy. No contrast complications. See chart for type and volume of contrast used. Fluoroscopic Guidance: I was personally present during the use of fluoroscopy. "Tunnel Vision Technique" used to obtain the best possible view of the target area. Parallax error corrected before commencing the procedure. "Direction-depth-direction" technique used to introduce the needle under continuous pulsed fluoroscopy. Once target was reached, antero-posterior, oblique, and lateral fluoroscopic projection used confirm needle placement in all planes. Images permanently stored in EMR. Interpretation: I personally interpreted the imaging intraoperatively. Adequate needle placement confirmed in multiple planes. Appropriate spread of contrast into desired area was observed. No evidence of afferent or efferent intravascular uptake. No intrathecal or subarachnoid spread observed. Permanent images saved  into the patient's record.  Antibiotic Prophylaxis:   Anti-infectives (From admission, onward)   None     Indication(s): None identified  Post-operative Assessment:  Post-procedure Vital Signs:  Pulse/HCG Rate: 6860 Temp: 98.1 F (36.7 C) Resp: 15 BP: 111/67 SpO2: 98 %  EBL: None  Complications: No immediate post-treatment complications observed by team, or reported by patient.  Note: The patient tolerated the entire procedure well. A repeat set of vitals were taken after the procedure and the patient was kept under observation following institutional policy, for this type of procedure. Post-procedural neurological assessment was performed, showing return to baseline, prior to discharge. The patient was provided with post-procedure discharge instructions, including a section on how to identify potential problems. Should any problems arise concerning this procedure, the patient was given instructions to immediately contact us, at any time, without hesitation. In any case, we plan to contact the patient by telephone for a follow-up status report regarding this interventional procedure.  Comments:  No additional relevant information.  Plan of Care  Orders:  Orders Placed This Encounter  Procedures  . Lumbar Epidural Injection    Scheduling Instructions:     Procedure: Interlaminar LESI L3-4     Laterality: Left-sided     Sedation: Patient's choice     Timeframe:  Today    Order Specific Question:   Where will this procedure be performed?    Answer:   [facility]  . DG PAIN [facility] C-ARM 1-60 MIN NO REPORT    Intraoperative interpretation by procedural physician at [place].    Standing Status:   Standing    Number of Occurrences:   1    Order Specific Question:   Reason for exam:    Answer:   Assistance in needle guidance and placement for procedures requiring needle placement in or near specific anatomical locations not easily accessible without such  assistance.  . Provider attestation of informed consent for procedure/surgical case    I, the ordering provider, attest that I have discussed with the patient the benefits, risks, side effects, alternatives, likelihood of achieving goals and potential  problems during recovery for the procedure that I have provided informed consent.    Standing Status:   Standing    Number of Occurrences:   1  . Informed Consent Details: Transcribe to consent form and obtain patient signature    Standing Status:   Standing    Number of Occurrences:   1    Order Specific Question:   Procedure    Answer:   Lumbar epidural steroid injection under fluoroscopic guidance. (See notes for level and laterality.)    Order Specific Question:   Surgeon    Answer:   Leo Weyandt A. Dossie Arbour, MD    Order Specific Question:   Indication/Reason    Answer:   Low back pain and/or leg pain secondary to lumbar radiculitis/radiculopathy   Chronic Opioid Analgesic:  Oxycodone IR 5 mg 1 to 2 tablets p.o. at bedtime (10 mg/day of oxycodone) MME/day: 15 mg/day.   Medications ordered for procedure: Meds ordered this encounter  Medications  . iohexol (OMNIPAQUE) 180 MG/ML injection 10 mL    Must be Myelogram-compatible. If not available, you may substitute with a water-soluble, non-ionic, hypoallergenic, myelogram-compatible radiological contrast medium.  Marland Kitchen lidocaine (XYLOCAINE) 2 % (with pres) injection 400 mg  . sodium chloride flush (NS) 0.9 % injection 2 mL  . ropivacaine (PF) 2 mg/mL (0.2%) (NAROPIN) injection 2 mL  . triamcinolone acetonide (KENALOG-40) injection 40 mg   Medications administered: We administered iohexol, lidocaine, sodium chloride flush, ropivacaine (PF) 2 mg/mL (0.2%), and triamcinolone acetonide.  See the medical record for exact dosing, route, and time of administration.  Follow-up plan:   Return for (VV), 2 wk PP-F/U Eval.       Interventional management options: Procedure(s) under consideration:   Diagnostic left-sided lumbar epidural steroid injection under fluoroscopic guidance and IV sedation.    Recent Visits Date Type Provider Dept  09/12/18 Office Visit [name], MD [facility]  08/14/18 Office Visit [name], MD [facility]  07/26/18 Office Visit [name], MD [facility]  07/03/18 Office Visit [name], MD [facility]  Showing recent visits within past 90 days and meeting all other requirements   Today's Visits Date Type Provider Dept  09/25/18 Procedure visit [name], MD [facility]  Showing today's visits and meeting all other requirements   Future Appointments Date Type Provider Dept  10/17/18 Appointment [name], MD [facility]  Showing future appointments within next 90 days and meeting all other requirements   Disposition: Discharge home  Discharge Date & Time: 09/25/2018; 1350 hrs.   Primary Care Physician: Olin Hauser, DO Location: [facility] Note by: Gaspar Cola, MD Date: 09/25/2018; Time: 1:49 PM  Disclaimer:  Medicine is not an Chief Strategy Officer. The only guarantee in medicine is that nothing is guaranteed. It is important to note that the decision to proceed with this intervention was based on the information collected from the patient. The Data and conclusions were drawn from the patient's questionnaire, the interview, and the physical examination. Because the information was provided in large part by the patient, it cannot be guaranteed that it has not been purposely or unconsciously manipulated. Every effort has been made to obtain as much relevant data as possible for this evaluation. It is important to note that the conclusions that lead to this procedure are derived in large part from the available data. Always take into account that the treatment will also be dependent on availability of  resources and existing treatment guidelines, considered by other Pain Management Practitioners as being common knowledge and practice, at the time of the intervention. For Medico-Legal purposes, it is also important to point out that variation in procedural techniques and pharmacological choices are the acceptable norm. The indications, contraindications, technique, and results of the above procedure should only be interpreted and judged by a Board-Certified Interventional Pain Specialist with extensive familiarity and expertise in the same exact procedure and technique.

## 2018-09-26 ENCOUNTER — Telehealth: Payer: Self-pay

## 2018-09-26 NOTE — Telephone Encounter (Signed)
Post procedure phone call. Patient states he is doing well.  

## 2018-09-27 ENCOUNTER — Telehealth: Payer: Self-pay | Admitting: Pain Medicine

## 2018-09-27 NOTE — Telephone Encounter (Signed)
I don't see anything in your note about why this has been changed.

## 2018-09-27 NOTE — Telephone Encounter (Signed)
I don't see anything in your notes about why this was changed.

## 2018-09-27 NOTE — Telephone Encounter (Signed)
Patient picked up meds today Pregabalin, the dosage is different and he wants to make sure it is correct. Please call to verify his prescription for this medication Last time it was 1 in am, 2 in pm. This time it is 2 in pm

## 2018-10-02 ENCOUNTER — Other Ambulatory Visit: Payer: Self-pay | Admitting: Pain Medicine

## 2018-10-02 DIAGNOSIS — M792 Neuralgia and neuritis, unspecified: Secondary | ICD-10-CM

## 2018-10-02 MED ORDER — PREGABALIN 50 MG PO CAPS: ORAL_CAPSULE | ORAL | 1 refills | Status: DC

## 2018-10-02 NOTE — Telephone Encounter (Signed)
Patient needs to know what to do about medication. Has been waiting since last week for an answer. Please find out from Dr. Dossie Arbour and call patient. He is upset due to no response.

## 2018-10-02 NOTE — Telephone Encounter (Signed)
I spoke with Dr. Dossie Arbour, he's not sure why the Lyrica dose was changed. Patient asked if he is having adverse side effects such as drowziness, patient denied. He said he was satisfied with the previous dose. Dr. Dossie Arbour will send a script for the previous dose.

## 2018-10-02 NOTE — Progress Notes (Signed)
I have corrected the issue with the Lyrica prescription where I gave him less medication on the last prescription.  He is currently taking Lyrica 50 mg 2 tablets at bedtime and 1 in a.m.

## 2018-10-16 ENCOUNTER — Encounter: Payer: Self-pay | Admitting: Pain Medicine

## 2018-10-16 NOTE — Progress Notes (Signed)
Pain Management Virtual Encounter Note - Virtual Visit via Telephone Telehealth (real-time audio visits between healthcare provider and patient).   Patient's Phone No. & Preferred Pharmacy:  [phone] (home); [phone] (mobile); (Preferred) [phone] [email or web address]  [place], [address] [place] [address] Phone: [phone] Fax: [phone]    Pre-screening note:  Our staff contacted [place] and offered him an "in person", "face-to-face" appointment versus a telephone encounter. He indicated preferring the telephone encounter, at this time.   Reason for Virtual Visit: COVID-19*  Social distancing based on CDC and AMA recommendations.   I contacted Matthew [place] on 10/17/2018 via telephone.      I clearly identified myself as Matthew Cola, MD. I verified that I was speaking with the correct person using two identifiers (Name: Matthew [place], and date of birth: 03/25/1950).  Advanced Informed Consent I sought verbal advanced consent from Matthew [place] for virtual visit interactions. I informed Matthew [place] of possible security and privacy concerns, risks, and limitations associated with providing "not-in-person" medical evaluation and management services. I also informed Matthew [place] of the availability of "in-person" appointments. Finally, I informed him that there would be a charge for the virtual visit and that he could be  personally, fully or partially, financially responsible for it. Matthew [place] expressed understanding and agreed to proceed.   Historic Elements   Matthew [place] is a 68 y.o. year old, male patient evaluated today after his last encounter by our practice on 09/27/2018. Matthew [place]  has a past medical history of Anxiety, Arthritis, Blood vessel disorder, BPH (benign prostatic hyperplasia), GERD (gastroesophageal reflux disease), Insomnia, Minimal change disease, Nocturia, PSVT (paroxysmal supraventricular tachycardia)  ([place]), and TIA (transient ischemic attack). He also  has a past surgical history that includes Renal biopsy; Nasal septoplasty w/ turbinoplasty (Bilateral, 03/22/2018); Nasopharyngoscopy eustation tube balloon dilation (Bilateral, 03/22/2018); Nasal endoscopy (Bilateral, 03/22/2018); and Excision nasal mass (Left, 03/22/2018). Matthew [place] has a current medication list which includes the following prescription(s): atorvastatin, buspirone, cyclobenzaprine, diclofenac sodium, esomeprazole, fluticasone, hydroxyzine, ketoconazole, meloxicam, multivitamin with minerals, oxycodone, oxycodone, pregabalin, triamcinolone ointment, venlafaxine xr, and metoprolol succinate. He  reports that he has quit smoking. His smoking use included cigars. He quit after 4.00 years of use. He uses smokeless tobacco. He reports current alcohol use of about 18.0 standard drinks of alcohol per week. He reports that he does not use drugs. Matthew [place] has No Known Allergies.   HPI  Today, he is being contacted for a post-procedure assessment.  The patient describes having done great after the procedure to the point where he has no sciatica left.  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  I have explained to patient that this could be a combination of an electrolyte imbalance triggered by the steroids versus having lost some sodium while sweating in the yard.  He admitted that he tends to sweat profusely and he agrees it sounds like a likely explanation. I have provided him with information on how to treat it in today's AVS.  Post-Procedure Evaluation  Procedure: Diagnostic left-sided L3-4 interlaminar LESI #1 under fluoroscopic guidance and IV sedation Pre-procedure pain level:  5/10 Post-procedure: 0/10 (100% relief)  Sedation: Sedation provided.  Effectiveness during initial hour after procedure(Ultra-Short Term Relief): 100 %   Local anesthetic  used: Long-acting (4-6 hours) Effectiveness: Defined  as any analgesic benefit obtained secondary to the administration of local anesthetics. This carries significant diagnostic value as to the etiological location, or anatomical origin, of the pain. Duration of benefit is expected to coincide with the duration of the local anesthetic used.  Effectiveness during initial 4-6 hours after procedure(Short-Term Relief): 100 %   Long-term benefit: Defined as any relief past the pharmacologic duration of the local anesthetics.  Effectiveness past the initial 6 hours after procedure(Long-Term Relief): 100 %   Current benefits: Defined as benefit that persist at this time.   Analgesia:  90-100% better Function: Matthew [place] reports improvement in function ROM: Matthew [place] reports improvement in ROM  Pharmacotherapy Assessment  Analgesic: Oxycodone IR 5 mg 1 to 2 tablets p.o. at bedtime (10 mg/day of oxycodone) (has enough to last until 11/23/2018) MME/day: 15 mg/day.   Monitoring: Pharmacotherapy: No side-effects or adverse reactions reported. [place] PMP: PDMP not reviewed this encounter.       Compliance: No problems identified. Effectiveness: Clinically acceptable. Plan: Refer to "POC".  UDS: No results found for: SUMMARY Laboratory Chemistry Profile (12 mo)  Renal: No results found for requested labs within last 8760 hours.  Hepatic: No results found for requested labs within last 8760 hours. Other: No results found for requested labs within last 8760 hours. Note: Above Lab results reviewed.  Imaging  Last 90 days:  Dg Pain Clinic C-arm 1-60 Min No Report  Result Date: 09/25/2018 Fluoro was used, but no Radiologist interpretation will be provided. Please refer to "NOTES" tab for provider progress note.  Last Hospital Admission:  Dg Pain Clinic C-arm 1-60 Min No Report  Result Date: 09/25/2018 Fluoro was used, but no Radiologist interpretation will be provided. Please refer to "NOTES" tab for  provider progress note.  Assessment  The primary encounter diagnosis was Chronic pain syndrome. Diagnoses of Chronic pain of lower extremity (Primary area of Pain) (Bilateral) (L>R), Chronic buttock pain (Secondary Area of Pain) (Bilateral) (L>R), Chronic hip pain (Tertiary Area of Pain) (Bilateral) (L>R), Chronic knee pain (Fifth Area of Pain) (Left), Pharmacologic therapy, Disorder of skeletal system, and Problems influencing health status were also pertinent to this visit.  Plan of Care  I am having Lanny Hurst A. Mazzie maintain his multivitamin with minerals, fluticasone, triamcinolone ointment, metoprolol succinate, hydrOXYzine, ketoconazole, venlafaxine XR, atorvastatin, esomeprazole, busPIRone, cyclobenzaprine, diclofenac sodium, meloxicam, oxyCODONE, oxyCODONE, and pregabalin.  Pharmacotherapy (Medications Ordered): No orders of the defined types were placed in this encounter.  Orders:  Orders Placed This Encounter  Procedures  . Lumbar Epidural Injection    Standing Status:   Standing    Number of Occurrences:   6    Standing Expiration Date:   04/18/2020    Scheduling Instructions:     Procedure: Interlaminar Lumbar Epidural Steroid injection (LESI)  L3-4     Laterality: Left-sided     Sedation: Patient's choice.     Timeframe: PRN, Patient will call    Order Specific Question:   Where will this procedure be performed?    Answer:   [facility]  . ToxASSURE Select 13 (MW), Urine    Volume: 30 ml(s). Minimum 3 ml of urine is needed. Document temperature of fresh sample. Indications: Long term (current) use of opiate analgesic (Z79.891)  . Comp. Metabolic Panel (12)    With GFR. Indications: Chronic Pain Syndrome (G89.4) & Pharmacotherapy (E83.151)    Order Specific Question:   Has the patient fasted?    Answer:   No  Order Specific Question:   CC Results    Answer:   PCP-NURSE [[id number]]  . Magnesium    Indication: Pharmacologic therapy (D98.338)    Order Specific  Question:   CC Results    Answer:   PCP-NURSE [[id number]]  . Vitamin B12    Indication: Pharmacologic therapy (J67.341).    Order Specific Question:   CC Results    Answer:   PCP-NURSE [[id number]]  . Sedimentation rate    Indication: Disorder of skeletal system (M89.9)    Order Specific Question:   CC Results    Answer:   PCP-NURSE [[id number]]  . 25-Hydroxyvitamin D Lcms D2+D3    Indication: Disorder of skeletal system (M89.9).    Order Specific Question:   CC Results    Answer:   PCP-NURSE [[id number]]  . C-reactive protein    Indication: Problems influencing health status (Z78.9)    Order Specific Question:   CC Results    Answer:   PCP-NURSE [[id number]]   Follow-up plan:   Return in about 5 weeks (around 11/21/2018) for (VV), E/M (MM), in addition, PRN Procedure: (L) L3-4 LESI #2, (w/ Sedation).      Interventional management options: Procedure(s) under consideration:  Diagnostic left-sided lumbar epidural steroid injection under fluoroscopic guidance and IV sedation.     Recent Visits Date Type Provider Dept  09/25/18 Procedure visit [name], MD [facility]  09/12/18 Office Visit [name], MD [facility]  08/14/18 Office Visit [name], MD [facility]  07/26/18 Office Visit [name], MD [facility]  Showing recent visits within past 90 days and meeting all other requirements   Today's Visits Date Type Provider Dept  10/17/18 Office Visit [name], MD [facility]  Showing today's visits and meeting all other requirements   Future Appointments No visits were found meeting these conditions.  Showing future appointments within next 90 days and meeting all other requirements   I discussed the assessment and treatment plan with the patient. The patient was provided an opportunity to ask questions and all were answered. The patient agreed with the plan and demonstrated an understanding of the  instructions.  Patient advised to call back or seek an in-person evaluation if the symptoms or condition worsens.  Total duration of non-face-to-face encounter: 15 minutes.  Note by: Matthew Cola, MD Date: 10/17/2018; Time: 1:33 PM  Note: This dictation was prepared with Dragon dictation. Any transcriptional errors that may result from this process are unintentional.  Disclaimer:  * Given the special circumstances of the COVID-19 pandemic, the federal government has announced that the Office for Civil Rights (OCR) will exercise its enforcement discretion and will not impose penalties on physicians using telehealth in the event of noncompliance with regulatory requirements under the [place] (HIPAA) in connection with the good faith provision of telehealth during the DQQIW-97 national public health emergency. ([place])

## 2018-10-17 ENCOUNTER — Ambulatory Visit: Payer: Medicare Other | Attending: Pain Medicine | Admitting: Pain Medicine

## 2018-10-17 ENCOUNTER — Other Ambulatory Visit: Payer: Self-pay

## 2018-10-17 DIAGNOSIS — Z79899 Other long term (current) drug therapy: Secondary | ICD-10-CM | POA: Diagnosis not present

## 2018-10-17 DIAGNOSIS — Z789 Other specified health status: Secondary | ICD-10-CM

## 2018-10-17 DIAGNOSIS — M25552 Pain in left hip: Secondary | ICD-10-CM

## 2018-10-17 DIAGNOSIS — M7918 Myalgia, other site: Secondary | ICD-10-CM | POA: Diagnosis not present

## 2018-10-17 DIAGNOSIS — M25562 Pain in left knee: Secondary | ICD-10-CM

## 2018-10-17 DIAGNOSIS — M25551 Pain in right hip: Secondary | ICD-10-CM | POA: Diagnosis not present

## 2018-10-17 DIAGNOSIS — M899 Disorder of bone, unspecified: Secondary | ICD-10-CM

## 2018-10-17 DIAGNOSIS — G894 Chronic pain syndrome: Secondary | ICD-10-CM | POA: Diagnosis not present

## 2018-10-17 DIAGNOSIS — M79605 Pain in left leg: Secondary | ICD-10-CM | POA: Diagnosis not present

## 2018-10-17 DIAGNOSIS — G8929 Other chronic pain: Secondary | ICD-10-CM

## 2018-10-17 DIAGNOSIS — M79604 Pain in right leg: Secondary | ICD-10-CM | POA: Diagnosis not present

## 2018-10-17 NOTE — Patient Instructions (Signed)
____________________________________________________________________________________________  Muscle Spasms & Cramps  Cause:  The most common cause of muscle spasms and cramps is vitamin and/or electrolyte (calcium, potassium, sodium, etc.) deficiencies.  Possible triggers: Sweating - causes loss of electrolytes thru the skin. Steroids - causes loss of electrolytes thru the urine.  Treatment: 1. Gatorade (or any other electrolyte-replenishing drink) - Take 1, 8 oz glass with each meal (3 times a day). 2. OTC (over-the-counter) Magnesium 400 to 500 mg - Take 1 tablet twice a day (one with breakfast and one before bedtime). If you have kidney problems, talk to your primary care physician before taking any Magnesium. 3. Tonic Water with quinine - Take 1, 8 oz glass before bedtime.   ____________________________________________________________________________________________   ____________________________________________________________________________________________  Pain Prevention Technique  Definition:   A technique used to minimize the effects of an activity known to cause inflammation or swelling, which in turn leads to an increase in pain.  Purpose: To prevent swelling from occurring. It is based on the fact that it is easier to prevent swelling from happening than it is to get rid of it, once it occurs.  Contraindications: 1. Anyone with allergy or hypersensitivity to the recommended medications. 2. Anyone taking anticoagulants (Blood Thinners) (e.g., Coumadin, Warfarin, Plavix, etc.). 3. Patients in Renal Failure.  Technique: Before you undertake an activity known to cause pain, or a flare-up of your chronic pain, and before you experience any pain, do the following:  1. On a full stomach, take 4 (four) over the counter Ibuprofens 200mg  tablets (Motrin), for a total of 800 mg. 2. In addition, take over the counter Magnesium 400 to 500 mg, before doing the activity.   3. Six (6) hours later, again on a full stomach, repeat the Ibuprofen. 4. That night, take a warm shower and stretch under the running warm water.  This technique may be sufficient to abort the pain and discomfort before it happens. Keep in mind that it takes a lot less medication to prevent swelling than it takes to eliminate it once it occurs.  ____________________________________________________________________________________________   ____________________________________________________________________________________________  Preparing for Procedure with Sedation  Procedure appointments are limited to planned procedures: . No Prescription Refills. . No disability issues will be discussed. . No medication changes will be discussed.  Instructions: . Oral Intake: Do not eat or drink anything for at least 8 hours prior to your procedure. . Transportation: Public transportation is not allowed. Bring an adult driver. The driver must be physically present in our waiting room before any procedure can be started. Marland Kitchen Physical Assistance: Bring an adult physically capable of assisting you, in the event you need help. This adult should keep you company at home for at least 6 hours after the procedure. . Blood Pressure Medicine: Take your blood pressure medicine with a sip of water the morning of the procedure. . Blood thinners: Notify our staff if you are taking any blood thinners. Depending on which one you take, there will be specific instructions on how and when to stop it. . Diabetics on insulin: Notify the staff so that you can be scheduled 1st case in the morning. If your diabetes requires high dose insulin, take only  of your normal insulin dose the morning of the procedure and notify the staff that you have done so. . Preventing infections: Shower with an antibacterial soap the morning of your procedure. . Build-up your immune system: Take 1000 mg of Vitamin C with every meal (3 times a  day) the day prior  to your procedure. Marland Kitchen Antibiotics: Inform the staff if you have a condition or reason that requires you to take antibiotics before dental procedures. . Pregnancy: If you are pregnant, call and cancel the procedure. . Sickness: If you have a cold, fever, or any active infections, call and cancel the procedure. . Arrival: You must be in the facility at least 30 minutes prior to your scheduled procedure. . Children: Do not bring children with you. . Dress appropriately: Bring dark clothing that you would not mind if they get stained. . Valuables: Do not bring any jewelry or valuables.  Reasons to call and reschedule or cancel your procedure: (Following these recommendations will minimize the risk of a serious complication.) . Surgeries: Avoid having procedures within 2 weeks of any surgery. (Avoid for 2 weeks before or after any surgery). . Flu Shots: Avoid having procedures within 2 weeks of a flu shots or . (Avoid for 2 weeks before or after immunizations). . Barium: Avoid having a procedure within 7-10 days after having had a radiological study involving the use of radiological contrast. (Myelograms, Barium swallow or enema study). . Heart attacks: Avoid any elective procedures or surgeries for the initial 6 months after a "Myocardial Infarction" (Heart Attack). . Blood thinners: It is imperative that you stop these medications before procedures. Let us know if you if you take any blood thinner.  . Infection: Avoid procedures during or within two weeks of an infection (including chest colds or gastrointestinal problems). Symptoms associated with infections include: Localized redness, fever, chills, night sweats or profuse sweating, burning sensation when voiding, cough, congestion, stuffiness, runny nose, sore throat, diarrhea, nausea, vomiting, cold or Flu symptoms, recent or current infections. It is specially important if the infection is over the area that we intend to  treat. Marland Kitchen Heart and lung problems: Symptoms that may suggest an active cardiopulmonary problem include: cough, chest pain, breathing difficulties or shortness of breath, dizziness, ankle swelling, uncontrolled high or unusually low blood pressure, and/or palpitations. If you are experiencing any of these symptoms, cancel your procedure and contact your primary care physician for an evaluation.  Remember:  Regular Business hours are:  Monday to Thursday 8:00 AM to 4:00 PM  Provider's Schedule: [name], MD:  Procedure days: Tuesday and Thursday 7:30 AM to 4:00 PM  Gillis Santa, MD:  Procedure days: Monday and Wednesday 7:30 AM to 4:00 PM ____________________________________________________________________________________________

## 2018-10-23 ENCOUNTER — Other Ambulatory Visit: Payer: Self-pay | Admitting: Pain Medicine

## 2018-10-23 DIAGNOSIS — G8929 Other chronic pain: Secondary | ICD-10-CM

## 2018-10-23 DIAGNOSIS — M7918 Myalgia, other site: Secondary | ICD-10-CM

## 2018-11-07 ENCOUNTER — Other Ambulatory Visit: Payer: Self-pay

## 2018-11-07 ENCOUNTER — Encounter: Payer: Self-pay | Admitting: Family Medicine

## 2018-11-07 ENCOUNTER — Ambulatory Visit (INDEPENDENT_AMBULATORY_CARE_PROVIDER_SITE_OTHER): Payer: Medicare Other | Admitting: Family Medicine

## 2018-11-07 VITALS — BP 118/67 | HR 62 | Temp 98.4°F | Resp 16 | Ht 69.0 in | Wt 202.0 lb

## 2018-11-07 DIAGNOSIS — H6983 Other specified disorders of Eustachian tube, bilateral: Secondary | ICD-10-CM | POA: Diagnosis not present

## 2018-11-07 DIAGNOSIS — J0100 Acute maxillary sinusitis, unspecified: Secondary | ICD-10-CM | POA: Diagnosis not present

## 2018-11-07 DIAGNOSIS — J3089 Other allergic rhinitis: Secondary | ICD-10-CM | POA: Diagnosis not present

## 2018-11-07 DIAGNOSIS — F411 Generalized anxiety disorder: Secondary | ICD-10-CM | POA: Diagnosis not present

## 2018-11-07 DIAGNOSIS — H8113 Benign paroxysmal vertigo, bilateral: Secondary | ICD-10-CM

## 2018-11-07 DIAGNOSIS — H6593 Unspecified nonsuppurative otitis media, bilateral: Secondary | ICD-10-CM

## 2018-11-07 MED ORDER — MONTELUKAST SODIUM 10 MG PO TABS: 10.0000 mg | ORAL_TABLET | Freq: Every day | ORAL | 1 refills | Status: DC

## 2018-11-07 MED ORDER — HYDROXYZINE PAMOATE 25 MG PO CAPS: 25.0000 mg | ORAL_CAPSULE | Freq: Every evening | ORAL | 1 refills | Status: DC | PRN

## 2018-11-07 MED ORDER — AMOXICILLIN-POT CLAVULANATE 875-125 MG PO TABS: 1.0000 | ORAL_TABLET | Freq: Two times a day (BID) | ORAL | 0 refills | Status: DC

## 2018-11-07 NOTE — Patient Instructions (Addendum)
Thank you for coming to the office today.  Possible sinus infection - Start Augmentin 1 pill twice daily (breakfast and dinner, with food and plenty of water) for 7 days, complete entire course, do not stop early even if feeling better - Continue allergy treatment  START Singulair (montelukast) 10mg  nightly for allergies  Consider original sudafed decongestant  Future may need an anti histamine nasal spray  Consider return to Dr Lum B[facility] for next opinion - since seems most of issues are from fluid in middle / inner ear possibly poor drainage.  - Improve hydration by drinking plenty of clear fluids (water, gatorade) to reduce secretions and thin congestion - Congestion draining down throat can cause irritation. May try warm herbal tea with honey, cough drops - Can take Tylenol or Ibuprofen as needed for fevers   Please schedule a Follow-up Appointment to: Return if symptoms worsen or fail to improve, for sinus/ears.  If you have any other questions or concerns, please feel free to call the office or send a message through [place]. You may also schedule an earlier appointment if necessary.  Additionally, you may be receiving a survey about your experience at our office within a few days to 1 week by e-mail or mail. We value your feedback.  Nobie Putnam, DO [facility], [place]  1. You have symptoms of Vertigo (Benign Paroxysmal Positional Vertigo) - This is commonly caused by inner ear fluid imbalance, sometimes can be worsened by allergies and sinus symptoms, otherwise it can occur randomly sometimes and we may never discover the exact cause. - To treat this, try the Epley Manuever (see diagrams/instructions below) at home up to 3 times a day for 1-2 weeks or until symptoms resolve - You may take Meclizine as needed up to 3 times a day for dizziness, this will not cure symptoms but may help. Caution may make you drowsy.  If you develop significant  worsening episode with vertigo that does not improve and you get severe headache, loss of vision, arm or leg weakness, slurred speech, or other concerning symptoms please seek immediate medical attention at Emergency Department.  See the next page for images describing the [place].     ----------------------------------------------------------------------------------------------------------------------

## 2018-11-07 NOTE — Progress Notes (Signed)
Subjective:    Patient ID: Matthew Maldonado, male    DOB: [birth year], 68 y.o.   MRN: [id number]  Matthew Maldonado is a 68 y.o. male presenting on 11/07/2018 for Sinusitis (2 weeks) and Otitis Media (onset 2 weeks both side )   HPI    Sinusitis / Ear Pressure / VERTIGO Reports problem for few weeks now with persistent sinus pressure congestion. Worsening, similar sinus infection in past, common for him. He has had sinus surgery before and followed [facility] - January 2019, sinus surgery - has done well. - Tried Flonase nasal spray without good results - Sinus saline wash, without any improvement - He has had episodes of dizziness lightheadedness, episode can be almost daily, can occur different times of day, usually not provoked, seems almost as if took a medication that made him feel groggy but he cannot associate it with any medication - He has had allergies with Hay Fever that has helped him, allergy season, usually grasses bother him - He has history of some insomnia, had prior rx Hydroxyzine 25-75mg , he used to take in PM for anxiety and insomnia at times.  Additional update Back pain - improved sciatica, following - Dr Consuela Mimes 10/17/18 - epidural injection with good results, and he is usgin wedge seat cushion    Depression screen [facility] 2/9 11/07/2018 06/22/2018 03/21/2018  Decreased Interest 2 0 0  Down, Depressed, Hopeless 2 0 0  PHQ - 2 Score 4 0 0  Altered sleeping 2 - 0  Tired, decreased energy 3 - 0  Change in appetite 2 - 0  Feeling bad or failure about yourself  1 - 0  Trouble concentrating 0 - 0  Moving slowly or fidgety/restless 0 - 0  Suicidal thoughts 0 - 0  PHQ-9 Score 12 - 0  Difficult doing work/chores Not difficult at all - Not difficult at all    Social History   Tobacco Use  . Smoking status: Former Smoker    Years: 4.00    Types: Cigars  . Smokeless tobacco: Current User  . Tobacco comment: 1 or 2 a day   Substance Use Topics  . Alcohol use: Yes   Alcohol/week: 18.0 standard drinks    Types: 1 Cans of beer, 14 Shots of liquor, 3 Standard drinks or equivalent per week  . Drug use: No    Review of Systems Per HPI unless specifically indicated above     Objective:    BP 118/67   Pulse 62   Temp 98.4 F (36.9 C) (Oral)   Resp 16   Ht 5\' 9"  (1.753 m)   Wt 202 lb (91.6 kg)   BMI 29.83 kg/m   Wt Readings from Last 3 Encounters:  11/07/18 202 lb (91.6 kg)  09/25/18 195 lb (88.5 kg)  07/03/18 194 lb (88 kg)    Physical Exam Vitals signs and nursing note reviewed.  Constitutional:      General: He is not in acute distress.    Appearance: He is well-developed. He is not diaphoretic.     Comments: Well-appearing, comfortable, cooperative  HENT:     Head: Normocephalic and atraumatic.     Comments: Maxillary sinus tenderness R>L    Right Ear: Ear canal and external ear normal.     Left Ear: Ear canal and external ear normal.     Ears:     Comments: Bilateral TM with some clear effusion, some old scarring on TM. No erythema, no bulging, no cerumen  or other debris. Eyes:     General:        Right eye: No discharge.        Left eye: No discharge.     Conjunctiva/sclera: Conjunctivae normal.  Cardiovascular:     Rate and Rhythm: Normal rate.  Pulmonary:     Effort: Pulmonary effort is normal.  Skin:    General: Skin is warm and dry.     Findings: No erythema or rash.  Neurological:     Mental Status: He is alert and oriented to person, place, and time.  Psychiatric:        Behavior: Behavior normal.     Comments: Well groomed, good eye contact, normal speech and thoughts    Results for orders placed or performed during the hospital encounter of 09/21/18  SARS Coronavirus 2 (Performed in [facility] hospital lab)   Specimen: Nasal Swab  Result Value Ref Range   SARS Coronavirus 2 NEGATIVE NEGATIVE      Assessment & Plan:   Problem List Items Addressed This Visit    GAD (generalized anxiety disorder)  Improved  sleep on med PRN Request refill    Relevant Medications   hydrOXYzine (VISTARIL) 25 MG capsule    Other Visit Diagnoses    BPPV (benign paroxysmal positional vertigo), bilateral    -  Primary  Suspected bilateral-BPPV by history - No other significant neurological findings or focal deficits  Plan: 1. Handout given with Epley maneuver TID for 1-2 weeks until resolved 2. Return criteria, if not improved consider vestibular PT referral     Acute non-recurrent maxillary sinusitis       Relevant Medications   amoxicillin-clavulanate (AUGMENTIN) 875-125 MG tablet   Middle ear effusion, bilateral       Eustachian tube dysfunction, bilateral       Seasonal allergic rhinitis due to other allergic trigger       Relevant Medications   montelukast (SINGULAIR) 10 MG tablet      Subacute on chronic vs Recurrent bilateral eustachian tube dysfunction with secondary ear effusion, without loss of hearing Concern for acute sinusitis as well known chronic recurrent issue / allergic component Failed conservative therapy  Plan: 1. Start Augmentin x 7 days 2. May use other OTC sinus therapy and current allergy/nasal spray 3. Add Singulair 10mg  nightly for allergy 4. Try OTC sudafed decongestant if need Follow-up if not improved or worsening, return criteria   Meds ordered this encounter  Medications  . amoxicillin-clavulanate (AUGMENTIN) 875-125 MG tablet    Sig: Take 1 tablet by mouth 2 (two) times daily. For 7 days    Dispense:  14 tablet    Refill:  0  . hydrOXYzine (VISTARIL) 25 MG capsule    Sig: Take 1-3 capsules (25-75 mg total) by mouth at bedtime as needed (for severe anxiety sx and sleep).    Dispense:  270 capsule    Refill:  1  . montelukast (SINGULAIR) 10 MG tablet    Sig: Take 1 tablet (10 mg total) by mouth at bedtime.    Dispense:  90 tablet    Refill:  1     Follow up plan: Return if symptoms worsen or fail to improve, for sinus/ears.  Nobie Putnam, [facility] 11/07/2018, 3:46 PM

## 2018-11-09 ENCOUNTER — Other Ambulatory Visit: Payer: Self-pay | Admitting: Pain Medicine

## 2018-11-14 ENCOUNTER — Telehealth: Payer: Self-pay | Admitting: *Deleted

## 2018-11-14 ENCOUNTER — Encounter: Payer: Self-pay | Admitting: Pain Medicine

## 2018-11-15 ENCOUNTER — Ambulatory Visit: Payer: Medicare Other | Attending: Pain Medicine | Admitting: Pain Medicine

## 2018-11-15 ENCOUNTER — Other Ambulatory Visit: Payer: Self-pay

## 2018-11-15 DIAGNOSIS — M79604 Pain in right leg: Secondary | ICD-10-CM | POA: Diagnosis not present

## 2018-11-15 DIAGNOSIS — G894 Chronic pain syndrome: Secondary | ICD-10-CM

## 2018-11-15 DIAGNOSIS — M150 Primary generalized (osteo)arthritis: Secondary | ICD-10-CM | POA: Diagnosis not present

## 2018-11-15 DIAGNOSIS — M7918 Myalgia, other site: Secondary | ICD-10-CM | POA: Diagnosis not present

## 2018-11-15 DIAGNOSIS — M25552 Pain in left hip: Secondary | ICD-10-CM | POA: Diagnosis not present

## 2018-11-15 DIAGNOSIS — M25562 Pain in left knee: Secondary | ICD-10-CM | POA: Diagnosis not present

## 2018-11-15 DIAGNOSIS — M159 Polyosteoarthritis, unspecified: Secondary | ICD-10-CM

## 2018-11-15 DIAGNOSIS — M792 Neuralgia and neuritis, unspecified: Secondary | ICD-10-CM | POA: Diagnosis not present

## 2018-11-15 DIAGNOSIS — G8929 Other chronic pain: Secondary | ICD-10-CM | POA: Diagnosis not present

## 2018-11-15 DIAGNOSIS — M79605 Pain in left leg: Secondary | ICD-10-CM

## 2018-11-15 DIAGNOSIS — M25551 Pain in right hip: Secondary | ICD-10-CM

## 2018-11-15 DIAGNOSIS — M25561 Pain in right knee: Secondary | ICD-10-CM | POA: Diagnosis not present

## 2018-11-15 DIAGNOSIS — M79652 Pain in left thigh: Secondary | ICD-10-CM

## 2018-11-15 MED ORDER — OXYCODONE HCL 5 MG PO TABS: 5.0000 mg | ORAL_TABLET | Freq: Every evening | ORAL | 0 refills | Status: DC | PRN

## 2018-11-15 MED ORDER — PREGABALIN 50 MG PO CAPS: ORAL_CAPSULE | ORAL | 2 refills | Status: DC

## 2018-11-15 MED ORDER — MELOXICAM 15 MG PO TABS: 15.0000 mg | ORAL_TABLET | Freq: Every day | ORAL | 2 refills | Status: DC

## 2018-11-15 MED ORDER — CYCLOBENZAPRINE HCL 10 MG PO TABS: 10.0000 mg | ORAL_TABLET | Freq: Every evening | ORAL | 0 refills | Status: DC | PRN

## 2018-11-15 MED ORDER — DICLOFENAC SODIUM 1 % TD GEL: 4.0000 g | Freq: Four times a day (QID) | TRANSDERMAL | 99 refills | Status: DC

## 2018-11-15 NOTE — Progress Notes (Signed)
Pain Management Virtual Encounter Note - Virtual Visit via Telephone Telehealth (real-time audio visits between healthcare provider and patient).   Patient's Phone No. & Preferred Pharmacy:  [phone] (home); [phone] (mobile); (Preferred) [phone] [email or web address]  [place], [place] [address] Phone: [phone] Fax: [phone]    Pre-screening note:  Our staff contacted Mr. Alamillo and offered him an "in person", "face-to-face" appointment versus a telephone encounter. He indicated preferring the telephone encounter, at this time.   Reason for Virtual Visit: COVID-19*  Social distancing based on CDC and AMA recommendations.   I contacted [name] on 11/15/2018 via telephone.      I clearly identified myself as Gaspar Cola, MD. I verified that I was speaking with the correct person using two identifiers (Name: LARK SZETO, and date of birth: May 07, 1950).  Advanced Informed Consent I sought verbal advanced consent from [name] for virtual visit interactions. I informed Mr. Opsahl of possible security and privacy concerns, risks, and limitations associated with providing "not-in-person" medical evaluation and management services. I also informed Mr. Swiggett of the availability of "in-person" appointments. Finally, I informed him that there would be a charge for the virtual visit and that he could be  personally, fully or partially, financially responsible for it. Mr. Hession expressed understanding and agreed to proceed.   Historic Elements   Matthew Maldonado is a 68 y.o. year old, male patient evaluated today after his last encounter by our practice on 11/14/2018. Mr. Speyer  has a past medical history of Anxiety, Arthritis, Blood vessel disorder, BPH (benign prostatic hyperplasia), GERD (gastroesophageal reflux disease), Insomnia, Minimal change disease, Nocturia, PSVT (paroxysmal supraventricular tachycardia) ([place]),  and TIA (transient ischemic attack). He also  has a past surgical history that includes Renal biopsy; Nasal septoplasty w/ turbinoplasty (Bilateral, 03/22/2018); Nasopharyngoscopy eustation tube balloon dilation (Bilateral, 03/22/2018); Nasal endoscopy (Bilateral, 03/22/2018); and Excision nasal mass (Left, 03/22/2018). Mr. Centanni has a current medication list which includes the following prescription(s): amoxicillin-clavulanate, atorvastatin, buspirone, cyclobenzaprine, diclofenac sodium, esomeprazole, fluticasone, hydroxyzine, ketoconazole, meloxicam, metoprolol succinate, montelukast, multivitamin with minerals, oxycodone, oxycodone, oxycodone, pregabalin, and venlafaxine xr. He  reports that he has quit smoking. His smoking use included cigars. He quit after 4.00 years of use. He uses smokeless tobacco. He reports current alcohol use of about 18.0 standard drinks of alcohol per week. He reports that he does not use drugs. Mr. Desai has No Known Allergies.   HPI  Today, he is being contacted for medication management.  Pharmacotherapy Assessment  Analgesic: Oxycodone IR 5 mg 1 to 2 tablets p.o. at bedtime (10 mg/day of oxycodone) (has enough to last until 11/23/2018) MME/day: 15 mg/day.   Monitoring: Pharmacotherapy: No side-effects or adverse reactions reported. [place] PMP: PDMP reviewed during this encounter.       Compliance: No problems identified. Effectiveness: Clinically acceptable. Plan: Refer to "POC".  UDS: No results found for: SUMMARY Laboratory Chemistry Profile (12 mo)  Renal: No results found for requested labs within last 8760 hours.  Lab Results  Component Value Date   GFRAA 104 09/06/2017   GFRNONAA 90 09/06/2017   Hepatic: No results found for requested labs within last 8760 hours. Lab Results  Component Value Date   AST 41 (H) 09/06/2017   ALT 40 09/06/2017   Other: No results found for requested labs within last 8760 hours. Note: Above Lab results reviewed.  Imaging  Last  90 days:  Dg Pain Clinic C-arm 1-60 Min No Report  Result Date: 09/25/2018 Fluoro was used, but no Radiologist interpretation will be provided. Please refer to "NOTES" tab for provider progress note.   Assessment  The primary encounter diagnosis was Chronic pain syndrome. Diagnoses of Chronic pain of lower extremity (Primary area of Pain) (Bilateral) (L>R), Chronic buttock pain (Secondary Area of Pain) (Bilateral) (L>R), Chronic hip pain (Tertiary Area of Pain) (Bilateral) (L>R), Chronic thigh pain (Fourth Area of Pain) (Left), Chronic knee pain (Fifth Area of Pain) (Left), Primary osteoarthritis involving multiple joints, Chronic knee pain (Bilateral), Chronic musculoskeletal pain, Neurogenic pain, and Osteoarthritis involving multiple joints were also pertinent to this visit.  Plan of Care  I have discontinued Lanny Hurst A. Vallance's triamcinolone ointment. I am also having him start on oxyCODONE and oxyCODONE. Additionally, I am having him maintain his multivitamin with minerals, fluticasone, metoprolol succinate, ketoconazole, venlafaxine XR, atorvastatin, esomeprazole, busPIRone, amoxicillin-clavulanate, hydrOXYzine, montelukast, diclofenac sodium, cyclobenzaprine, pregabalin, meloxicam, and oxyCODONE.  Pharmacotherapy (Medications Ordered): Meds ordered this encounter  Medications  . diclofenac sodium (VOLTAREN) 1 % GEL    Sig: Apply 4 g topically 4 (four) times daily.    Dispense:  350 g    Refill:  PRN    Fill one day early if pharmacy is closed on scheduled refill date. May substitute for generic if available.  . cyclobenzaprine (FLEXERIL) 10 MG tablet    Sig: Take 1 tablet (10 mg total) by mouth at bedtime as needed for muscle spasms.    Dispense:  30 tablet    Refill:  0    Fill one day early if pharmacy is closed on scheduled refill date. May substitute for generic if available.  . pregabalin (LYRICA) 50 MG capsule    Sig: Take 2 capsules (100 mg total) by mouth at bedtime AND 1 capsule  (50 mg total) daily.    Dispense:  90 capsule    Refill:  2    Fill one day early if pharmacy is closed on scheduled refill date. May substitute for generic if available.  . meloxicam (MOBIC) 15 MG tablet    Sig: Take 1 tablet (15 mg total) by mouth daily.    Dispense:  30 tablet    Refill:  2    Fill one day early if pharmacy is closed on scheduled refill date. May substitute for generic if available.  Marland Kitchen oxyCODONE (OXY IR/ROXICODONE) 5 MG immediate release tablet    Sig: Take 1-2 tablets (5-10 mg total) by mouth at bedtime as needed for severe pain. Must last 30 days.    Dispense:  60 tablet    Refill:  0    Chronic Pain: STOP Act - Not applicable. Fill 1 day early if closed on scheduled refill date. Do not fill until: 11/23/2018. To last until: 12/23/2018. Instruct to avoid benzodiazepines within 8 hours of opioid.  Marland Kitchen oxyCODONE (OXY IR/ROXICODONE) 5 MG immediate release tablet    Sig: Take 1-2 tablets (5-10 mg total) by mouth at bedtime as needed for severe pain. Must last 30 days.    Dispense:  60 tablet    Refill:  0    Chronic Pain: STOP Act (Not applicable) Fill 1 day early if closed on refill date. Do not fill until: 12/23/2018. To last until: 01/22/2019. Avoid benzodiazepines within 8 hours of opioids  . oxyCODONE (OXY IR/ROXICODONE) 5 MG immediate release tablet    Sig: Take 1-2 tablets (5-10 mg total) by mouth at bedtime as needed for severe  pain. Must last 30 days.    Dispense:  60 tablet    Refill:  0    Chronic Pain: STOP Act (Not applicable) Fill 1 day early if closed on refill date. Do not fill until: 01/22/2019. To last until: 02/21/2019. Avoid benzodiazepines within 8 hours of opioids   Orders:  No orders of the defined types were placed in this encounter.  Follow-up plan:   No follow-ups on file.      Interventional management options: Procedure(s) under consideration:  Diagnostic left-sided lumbar epidural steroid injection under fluoroscopic guidance and IV  sedation.      Recent Visits Date Type Provider Dept  10/17/18 Office Visit [name], MD [facility]  09/25/18 Procedure visit [name], MD [facility]  09/12/18 Office Visit [name], MD [facility]  Showing recent visits within past 90 days and meeting all other requirements   Today's Visits Date Type Provider Dept  11/15/18 Office Visit [name], MD [facility]  Showing today's visits and meeting all other requirements   Future Appointments No visits were found meeting these conditions.  Showing future appointments within next 90 days and meeting all other requirements   I discussed the assessment and treatment plan with the patient. The patient was provided an opportunity to ask questions and all were answered. The patient agreed with the plan and demonstrated an understanding of the instructions.  Patient advised to call back or seek an in-person evaluation if the symptoms or condition worsens.  Total duration of non-face-to-face encounter: 12 minutes.  Note by: Gaspar Cola, MD Date: 11/15/2018; Time: 3:17 PM  Note: This dictation was prepared with Dragon dictation. Any transcriptional errors that may result from this process are unintentional.  Disclaimer:  * Given the special circumstances of the COVID-19 pandemic, the federal government has announced that the Office for Civil Rights (OCR) will exercise its enforcement discretion and will not impose penalties on physicians using telehealth in the event of noncompliance with regulatory requirements under the [place] (HIPAA) in connection with the good faith provision of telehealth during the XX123456 national public health emergency. ([place])

## 2018-11-17 ENCOUNTER — Other Ambulatory Visit: Payer: Self-pay | Admitting: Family Medicine

## 2018-11-17 DIAGNOSIS — F338 Other recurrent depressive disorders: Secondary | ICD-10-CM

## 2018-11-17 DIAGNOSIS — F411 Generalized anxiety disorder: Secondary | ICD-10-CM

## 2018-11-19 ENCOUNTER — Other Ambulatory Visit: Payer: Self-pay | Admitting: Family Medicine

## 2018-11-20 NOTE — Telephone Encounter (Signed)
Requested medication (s) are due for refill today: yes  Requested medication (s) are on the active medication list: yes  Last refill:  04/28/2018  Future visit scheduled:no    Notes to clinic:  Review for refill  Requested Prescriptions  Pending Prescriptions Disp Refills   metoprolol succinate (TOPROL-XL) 25 MG 24 hr tablet [Pharmacy Med Name: METOPROLOL SUCCINATE ER 25 MG TAB] 90 tablet 1    Sig: TAKE 1 TABLET BY MOUTH ONCE DAILY     Cardiovascular:  Beta Blockers Failed - 11/19/2018  1:22 PM      Failed - Valid encounter within last 6 months    Recent Outpatient Visits          11 months ago Acute swimmer's ear of right side   [facility] [place], Jeannette How, MD   1 year ago Pain of both hip joints   [facility] [place], Adriana M, PA-C   1 year ago Acute bilateral low back pain with bilateral sciatica   [place] [place], [place]   1 year ago Mixed hyperlipidemia   [facility] [place], [place], DO   1 year ago Acute non-recurrent maxillary sinusitis   [facility] [place], [name], DO             Passed - Last BP in normal range    BP Readings from Last 1 Encounters:  11/07/18 118/67         Passed - Last Heart Rate in normal range    Pulse Readings from Last 1 Encounters:  11/07/18 62

## 2018-11-20 NOTE — Telephone Encounter (Signed)
Doesn't look like patient is a current patient here. Looks like he established with Dr. Raliegh Ip at [facility].

## 2018-11-21 ENCOUNTER — Other Ambulatory Visit: Payer: Self-pay | Admitting: Family Medicine

## 2018-11-21 DIAGNOSIS — I471 Supraventricular tachycardia: Secondary | ICD-10-CM

## 2018-11-21 NOTE — Telephone Encounter (Signed)
Requested medication (s) are due for refill today: yes  Requested medication (s) are on the active medication list: yes  Last refill:  04/28/2018  Future visit scheduled: no  Notes to clinic: review for refill   Requested Prescriptions  Pending Prescriptions Disp Refills   metoprolol succinate (TOPROL-XL) 25 MG 24 hr tablet [Pharmacy Med Name: METOPROLOL SUCCINATE ER 25 MG TAB] 90 tablet 1    Sig: TAKE 1 TABLET BY MOUTH ONCE DAILY     Cardiovascular:  Beta Blockers Failed - 11/21/2018  2:29 PM      Failed - Valid encounter within last 6 months    Recent Outpatient Visits          11 months ago Acute swimmer's ear of right side   [facility] [place], Jeannette How, MD   1 year ago Pain of both hip joints   [facility] [place], Adriana M, PA-C   1 year ago Acute bilateral low back pain with bilateral sciatica   [facility] [place], [place]   1 year ago Mixed hyperlipidemia   [facility] [place], [place], DO   1 year ago Acute non-recurrent maxillary sinusitis   [facility] [place], [name], DO             Passed - Last BP in normal range    BP Readings from Last 1 Encounters:  11/07/18 118/67         Passed - Last Heart Rate in normal range    Pulse Readings from Last 1 Encounters:  11/07/18 62

## 2018-11-21 NOTE — Telephone Encounter (Signed)
I think this was sent to [place] by accident

## 2018-12-14 DIAGNOSIS — R42 Dizziness and giddiness: Secondary | ICD-10-CM | POA: Diagnosis not present

## 2018-12-14 DIAGNOSIS — J301 Allergic rhinitis due to pollen: Secondary | ICD-10-CM | POA: Diagnosis not present

## 2018-12-14 DIAGNOSIS — J3489 Other specified disorders of nose and nasal sinuses: Secondary | ICD-10-CM | POA: Diagnosis not present

## 2018-12-19 DIAGNOSIS — Z23 Encounter for immunization: Secondary | ICD-10-CM | POA: Diagnosis not present

## 2018-12-24 ENCOUNTER — Other Ambulatory Visit: Payer: Self-pay | Admitting: Family Medicine

## 2018-12-24 DIAGNOSIS — E782 Mixed hyperlipidemia: Secondary | ICD-10-CM

## 2018-12-25 NOTE — Progress Notes (Signed)
12/26/2018 11:03 AM   Matthew Maldonado 31-Mar-1950 [id number]  Referring provider: Olin Hauser, DO [address] [place],  [place] [address]  Chief Complaint  Patient presents with  . Benign Prostatic Hypertrophy   HPI: Matthew Maldonado is a 68 y.o. male with BPH with LU TS, nocturia and ED who presents today for an annual follow up.  BPH WITH LUTS  (prostate and/or bladder) IPSS score: 9/2    PVR: 0 mL   Previous score: 17/2  Previous PVR: 30 mL  Major complaint(s): Urgency, intermittency and a weak urinary stream.  The urgency is worsened by vodka tonics.  Denies any dysuria, hematuria or suprapubic pain.   Denies any recent fevers, chills, nausea or vomiting.  He does not have a family history of PCa.  IPSS    Row Name 12/26/18 1000         International Prostate Symptom Score   How often have you had the sensation of not emptying your bladder?  Less than half the time     How often have you had to urinate less than every two hours?  About half the time     How often have you found you stopped and started again several times when you urinated?  Less than 1 in 5 times     How often have you found it difficult to postpone urination?  Not at All     How often have you had a weak urinary stream?  Less than half the time     How often have you had to strain to start urination?  Not at All     How many times did you typically get up at night to urinate?  1 Time     Total IPSS Score  9       Quality of Life due to urinary symptoms   If you were to spend the rest of your life with your urinary condition just the way it is now how would you feel about that?  Mostly Satisfied        Score:  1-7 Mild 8-19 Moderate 20-35 Severe   Erectile dysfunction His SHIM score is 15, which is mild to moderate   His previous SHIM score was 11.  He is currently not sexually active as his wife suffers a traumatic brain injury after a MVA.   SHIM    Row Name 12/26/18 1045         SHIM: Over the last 6 months:   How do you rate your confidence that you could get and keep an erection?  Moderate     When you had erections with sexual stimulation, how often were your erections hard enough for penetration (entering your partner)?  Sometimes (about half the time)     During sexual intercourse, how often were you able to maintain your erection after you had penetrated (entered) your partner?  Sometimes (about half the time)     During sexual intercourse, how difficult was it to maintain your erection to completion of intercourse?  Difficult     When you attempted sexual intercourse, how often was it satisfactory for you?  Sometimes (about half the time)       SHIM Total Score   SHIM  15        Score: 1-7 Severe ED 8-11 Moderate ED 12-16 Mild-Moderate ED 17-21 Mild ED 22-25 No ED  PMH: Past Medical History:  Diagnosis Date  . Anxiety   .  Arthritis    hand, knees, hip  . 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   Marland Kitchen BPH (benign prostatic hyperplasia)   . GERD (gastroesophageal reflux disease)   . Insomnia   . Minimal change disease    no issues for last 4-5 yrs  . Nocturia   . PSVT (paroxysmal supraventricular tachycardia) ([place])   . TIA (transient ischemic attack)    Sees neurology at [facility] - last MRI 2017 showing stenosed M1    Surgical History: Past Surgical History:  Procedure Laterality Date  . EXCISION NASAL MASS Left 03/22/2018   Procedure: EXCISION nasopharyngeal lesion;  Surgeon: Margaretha Sheffield, MD;  Location: [place];  Service: ENT;  Laterality: Left;  . NASAL ENDOSCOPY Bilateral 03/22/2018   Procedure: NASAL ENDOSCOPY NASOPHARYNGESCOPY;  Surgeon: Margaretha Sheffield, MD;  Location: [place];  Service: ENT;  Laterality: Bilateral;  . NASAL SEPTOPLASTY W/ TURBINOPLASTY Bilateral 03/22/2018   Procedure: NASAL SEPTOPLASTY WITH INFERIORTURBINATE REDUCTION;  Surgeon: Margaretha Sheffield, MD;  Location: [place];  Service: ENT;  Laterality: Bilateral;  . NASOPHARYNGOSCOPY EUSTATION TUBE BALLOON DILATION Bilateral 03/22/2018   Procedure: NASOPHARYNGOSCOPY EUSTATION TUBE BALLOON DILATION;  Surgeon: Margaretha Sheffield, MD;  Location: [place];  Service: ENT;  Laterality: Bilateral;  . RENAL BIOPSY      Home Medications:  Allergies as of 12/26/2018   No Known Allergies     Medication List       Accurate as of December 26, 2018 11:03 AM. If you have any questions, ask your nurse or doctor.        STOP taking these medications   amoxicillin-clavulanate 875-125 MG tablet Commonly known as: AUGMENTIN Stopped by: Charmain Diosdado, PA-C     TAKE these medications   atorvastatin 80 MG tablet Commonly known as: LIPITOR TAKE 1 TABLET BY MOUTH AT BEDTIME   busPIRone 15 MG tablet Commonly known as: BUSPAR TAKE 1 TABLET BY MOUTH 2 TIMES PER DAY.   cyclobenzaprine 10 MG tablet Commonly known as: FLEXERIL Take 1 tablet (10 mg total) by mouth at bedtime as needed for muscle spasms.   diclofenac sodium 1 % Gel Commonly known as: VOLTAREN Apply 4 g topically 4 (four) times daily.   esomeprazole 20 MG capsule Commonly known as: NEXIUM TAKE 1 CAPSULE BY MOUTH ONCE DAILY AT 12NOON.   fluticasone 50 MCG/ACT nasal spray Commonly known as: FLONASE Place 2 sprays into both nostrils daily.   hydrOXYzine 25 MG capsule Commonly known as: Vistaril Take 1-3 capsules (25-75 mg total) by mouth at bedtime as needed (for severe anxiety sx and sleep).   ketoconazole 2 % shampoo Commonly known as: NIZORAL APPLY TOPICALLY AS NEEDED IRRITATION TO SCALP 1-2 TIMES A WEEK   meloxicam 15 MG tablet Commonly known as: MOBIC Take 1 tablet (15 mg total) by mouth daily.   metoprolol succinate 25 MG 24 hr tablet Commonly known as: TOPROL-XL TAKE 1 TABLET BY MOUTH ONCE DAILY   montelukast 10 MG tablet Commonly known as: SINGULAIR Take 1 tablet (10 mg total) by mouth at bedtime.   multivitamin  with minerals tablet Take 1 tablet by mouth daily.   oxyCODONE 5 MG immediate release tablet Commonly known as: Oxy IR/ROXICODONE Take 1-2 tablets (5-10 mg total) by mouth at bedtime as needed for severe pain. Must last 30 days.   oxyCODONE 5 MG immediate release tablet Commonly known as: Oxy IR/ROXICODONE Take 1-2 tablets (5-10 mg total) by  mouth at bedtime as needed for severe pain. Must last 30 days.   oxyCODONE 5 MG immediate release tablet Commonly known as: Oxy IR/ROXICODONE Take 1-2 tablets (5-10 mg total) by mouth at bedtime as needed for severe pain. Must last 30 days. Start taking on: January 22, 2019   pregabalin 50 MG capsule Commonly known as: Lyrica Take 2 capsules (100 mg total) by mouth at bedtime AND 1 capsule (50 mg total) daily.   venlafaxine XR 75 MG 24 hr capsule Commonly known as: EFFEXOR-XR TAKE 1 CAPSULE BY MOUTH ONCE DAILY       Allergies: No Known Allergies  Family History: Family History  Problem Relation Age of Onset  . Osteoporosis Mother   . Heart disease Father   . Heart disease Brother   . Alcohol abuse Brother   . Kidney disease Neg Hx   . Prostate cancer Neg Hx     Social History:  reports that he has quit smoking. His smoking use included cigars. He quit after 4.00 years of use. He uses smokeless tobacco. He reports current alcohol use of about 18.0 standard drinks of alcohol per week. He reports that he does not use drugs.  ROS: UROLOGY Frequent Urination?: No Hard to postpone urination?: Yes Burning/pain with urination?: No Get up at night to urinate?: No Leakage of urine?: No Urine stream starts and stops?: Yes Trouble starting stream?: No Do you have to strain to urinate?: No Blood in urine?: No Urinary tract infection?: No Sexually transmitted disease?: No Injury to kidneys or bladder?: No Painful intercourse?: No Weak stream?: Yes Erection problems?: No Penile pain?: No  Gastrointestinal Nausea?: No Vomiting?: No  Indigestion/heartburn?: Yes Diarrhea?: No Constipation?: No  Constitutional Fever: No Night sweats?: No Weight loss?: No Fatigue?: No  Skin Skin rash/lesions?: No Itching?: No  Eyes Blurred vision?: No Double vision?: No  Ears/Nose/Throat Sore throat?: No Sinus problems?: Yes  Hematologic/Lymphatic Swollen glands?: No Easy bruising?: No  Cardiovascular Leg swelling?: No Chest pain?: No  Respiratory Cough?: No Shortness of breath?: No  Endocrine Excessive thirst?: No  Musculoskeletal Back pain?: No Joint pain?: Yes  Neurological Headaches?: No Dizziness?: No  Psychologic Depression?: No Anxiety?: Yes  Physical Exam: BP 119/82   Pulse 62   Ht 5\' 9"  (1.753 m)   Wt 201 lb (91.2 kg)   BMI 29.68 kg/m   Constitutional:  Well nourished. Alert and oriented, No acute distress. HEENT: [place] AT, moist mucus membranes.  Trachea midline, no masses. Cardiovascular: No clubbing, cyanosis, or edema. Respiratory: Normal respiratory effort, no increased work of breathing. GI: Abdomen is soft, non tender, non distended, no abdominal masses. Liver and spleen not palpable.  No hernias appreciated.  Stool sample for occult testing is not indicated.   GU: No CVA tenderness.  No bladder fullness or masses.  Patient with circumcised phallus. Urethral meatus is patent.  No penile discharge. No penile lesions or rashes. Scrotum without lesions, cysts, rashes and/or edema.  Testicles are located scrotally bilaterally. No masses are appreciated in the testicles. Left and right epididymis are normal. Rectal: Patient with  normal sphincter tone. Anus and perineum without scarring or rashes. A polypoid lesion is palpated at the 8 o'clock position in the rectal canal.  Prostate is approximately 50 grams, nonodules are appreciated. Seminal vesicles could not be palpated.   Skin: No rashes, bruises or suspicious lesions. Lymph: No inguinal adenopathy. Neurologic: Grossly intact, no focal  deficits, moving all 4 extremities. Psychiatric: Normal mood and affect.  Laboratory Data: Lab Results  Component Value Date   WBC 5.6 03/21/2017   HGB 14.8 03/21/2017   HCT 43.9 03/21/2017   MCV 93 03/21/2017   PLT 207 03/21/2017    Lab Results  Component Value Date   CREATININE 0.87 09/06/2017    PSA History  Component     Latest Ref Rng & Units 10/30/2014 01/18/2016 01/31/2017  Prostate Specific Ag, Serum     0.0 - 4.0 ng/mL 0.8 1.1 1.1   Pertinent Imaging: Results for ZADKIEL, RASMUSSEN (MRN [id number]) as of 12/26/2018 10:46  Ref. Range 12/26/2018 10:39  Scan Result Unknown 0    Assessment & Plan:    1. BPH with LUTS IPSS score is 9/2, it is stable Continue conservative management, avoiding bladder irritants and timed voiding's Most bothersome symptoms is/are urgency PSA pending  RTC in 12 months for IPSS, PSA, PVR and exam   2. Erectile dysfunction SHIM score is 15, it is improving He is not currently sexually active  3. Rectal mass Internal hemorrhoid vs polyp  Will refer to GI   Return in about 1 year (around 12/26/2019) for IPSS, SHIM, PSA and exam.  [name], [facility]  [place], [place] [address] [phone]

## 2018-12-26 ENCOUNTER — Other Ambulatory Visit: Payer: Self-pay

## 2018-12-26 ENCOUNTER — Ambulatory Visit (INDEPENDENT_AMBULATORY_CARE_PROVIDER_SITE_OTHER): Payer: Medicare Other | Admitting: Urology

## 2018-12-26 ENCOUNTER — Encounter: Payer: Self-pay | Admitting: Urology

## 2018-12-26 VITALS — BP 119/82 | HR 62 | Ht 69.0 in | Wt 201.0 lb

## 2018-12-26 DIAGNOSIS — Z789 Other specified health status: Secondary | ICD-10-CM | POA: Diagnosis not present

## 2018-12-26 DIAGNOSIS — N529 Male erectile dysfunction, unspecified: Secondary | ICD-10-CM

## 2018-12-26 DIAGNOSIS — N401 Enlarged prostate with lower urinary tract symptoms: Secondary | ICD-10-CM | POA: Diagnosis not present

## 2018-12-26 DIAGNOSIS — N138 Other obstructive and reflux uropathy: Secondary | ICD-10-CM | POA: Diagnosis not present

## 2018-12-26 DIAGNOSIS — R351 Nocturia: Secondary | ICD-10-CM

## 2018-12-26 DIAGNOSIS — M899 Disorder of bone, unspecified: Secondary | ICD-10-CM | POA: Diagnosis not present

## 2018-12-26 DIAGNOSIS — G894 Chronic pain syndrome: Secondary | ICD-10-CM | POA: Diagnosis not present

## 2018-12-26 DIAGNOSIS — K621 Rectal polyp: Secondary | ICD-10-CM | POA: Diagnosis not present

## 2018-12-26 DIAGNOSIS — K629 Disease of anus and rectum, unspecified: Secondary | ICD-10-CM

## 2018-12-26 DIAGNOSIS — Z79899 Other long term (current) drug therapy: Secondary | ICD-10-CM | POA: Diagnosis not present

## 2018-12-26 LAB — BLADDER SCAN AMB NON-IMAGING: Scan Result: 0

## 2018-12-27 LAB — PSA: Prostate Specific Ag, Serum: 1 ng/mL (ref 0.0–4.0)

## 2019-01-02 LAB — COMP. METABOLIC PANEL (12)
AST: 32 IU/L (ref 0–40)
Albumin/Globulin Ratio: 2.2 (ref 1.2–2.2)
Albumin: 4.6 g/dL (ref 3.8–4.8)
Alkaline Phosphatase: 82 IU/L (ref 39–117)
BUN/Creatinine Ratio: 29 — ABNORMAL HIGH (ref 10–24)
BUN: 25 mg/dL (ref 8–27)
Bilirubin Total: 0.5 mg/dL (ref 0.0–1.2)
Calcium: 9.4 mg/dL (ref 8.6–10.2)
Chloride: 107 mmol/L — ABNORMAL HIGH (ref 96–106)
Creatinine, Ser: 0.85 mg/dL (ref 0.76–1.27)
GFR calc Af Amer: 103 mL/min/{1.73_m2} (ref >59)
GFR calc non Af Amer: 90 mL/min/{1.73_m2} (ref >59)
Globulin, Total: 2.1 g/dL (ref 1.5–4.5)
Glucose: 98 mg/dL (ref 65–99)
Potassium: 4.4 mmol/L (ref 3.5–5.2)
Sodium: 142 mmol/L (ref 134–144)
Total Protein: 6.7 g/dL (ref 6.0–8.5)

## 2019-01-02 LAB — VITAMIN B12: Vitamin B-12: 648 pg/mL (ref 232–1245)

## 2019-01-02 LAB — C-REACTIVE PROTEIN: CRP: 1 mg/L (ref 0–10)

## 2019-01-02 LAB — 25-HYDROXY VITAMIN D LCMS D2+D3
25-Hydroxy, Vitamin D-2: <1 ng/mL
25-Hydroxy, Vitamin D-3: 43 ng/mL
25-Hydroxy, Vitamin D: 43 ng/mL

## 2019-01-02 LAB — MAGNESIUM: Magnesium: 2.1 mg/dL (ref 1.6–2.3)

## 2019-01-02 LAB — SEDIMENTATION RATE: Sed Rate: 16 mm/hr (ref 0–30)

## 2019-01-09 DIAGNOSIS — D485 Neoplasm of uncertain behavior of skin: Secondary | ICD-10-CM | POA: Diagnosis not present

## 2019-01-09 DIAGNOSIS — D0462 Carcinoma in situ of skin of left upper limb, including shoulder: Secondary | ICD-10-CM | POA: Diagnosis not present

## 2019-01-09 NOTE — Progress Notes (Signed)
-    BUN levels between 7 to 20 mg/dL (2.5 to 7.1 mmol/L) are considered normal. Elevated blood urea nitrogen can also be due to: urinary tract obstruction; congestive heart failure or recent heart attack; gastrointestinal bleeding; dehydration; shock; severe burns; certain medications, such as corticosteroids and some antibiotics; and/or a high protein diet. ___________________________________________________________________________________ - Normal chloride levels are between 94 and 104 mEq/L, for our Lab. Elevated level may be due to: carbonic anhydrase inhibitors (used to treat glaucoma); diarrhea; metabolic acidosis; respiratory alkalosis (compensated); or renal tubular acidosis. ___________________________________________________________________________________

## 2019-01-21 ENCOUNTER — Other Ambulatory Visit: Payer: Self-pay | Admitting: Family Medicine

## 2019-01-21 DIAGNOSIS — F411 Generalized anxiety disorder: Secondary | ICD-10-CM

## 2019-01-21 DIAGNOSIS — F338 Other recurrent depressive disorders: Secondary | ICD-10-CM

## 2019-02-14 ENCOUNTER — Ambulatory Visit: Payer: Medicare Other | Admitting: Gastroenterology

## 2019-02-21 ENCOUNTER — Encounter: Payer: Self-pay | Admitting: Pain Medicine

## 2019-02-21 DIAGNOSIS — J301 Allergic rhinitis due to pollen: Secondary | ICD-10-CM | POA: Diagnosis not present

## 2019-02-24 NOTE — Progress Notes (Signed)
Pain Management Virtual Encounter Note - Virtual Visit via Telephone Telehealth (real-time audio visits between healthcare provider and patient).   Patient's Phone No. & Preferred Pharmacy:  [phone] (home); [phone] (mobile); (Preferred) [phone] [email or web address]  [place], [place] [address] Phone: [phone] Fax: [phone]    Pre-screening note:  Our staff contacted Matthew Maldonado and offered him an "in person", "face-to-face" appointment versus a telephone encounter. He indicated preferring the telephone encounter, at this time.   Reason for Virtual Visit: COVID-19*  Social distancing based on CDC and AMA recommendations.   I contacted Matthew Maldonado on 02/25/2019 via telephone.      I clearly identified myself as Gaspar Cola, MD. I verified that I was speaking with the correct person using two identifiers (Name: Matthew Maldonado, and date of birth: May 31, 1950).  Advanced Informed Consent I sought verbal advanced consent from Matthew Maldonado for virtual visit interactions. I informed Matthew Maldonado of possible security and privacy concerns, risks, and limitations associated with providing "not-in-person" medical evaluation and management services. I also informed Matthew Maldonado of the availability of "in-person" appointments. Finally, I informed him that there would be a charge for the virtual visit and that he could be  personally, fully or partially, financially responsible for it. Matthew Maldonado expressed understanding and agreed to proceed.   Historic Elements   [name] is a 68 y.o. year old, male patient evaluated today after his last encounter by our practice on 11/15/2018. Matthew Maldonado  has a past medical history of Anxiety, Arthritis, Blood vessel disorder, BPH (benign prostatic hyperplasia), GERD (gastroesophageal reflux disease), Insomnia, Minimal change disease, Nocturia, PSVT (paroxysmal supraventricular tachycardia)  ([place]), and TIA (transient ischemic attack). He also  has a past surgical history that includes Renal biopsy; Nasal septoplasty w/ turbinoplasty (Bilateral, 03/22/2018); Nasopharyngoscopy eustation tube balloon dilation (Bilateral, 03/22/2018); Nasal endoscopy (Bilateral, 03/22/2018); and Excision nasal mass (Left, 03/22/2018). Matthew Maldonado has a current medication list which includes the following prescription(s): atorvastatin, buspirone, cyclobenzaprine, esomeprazole, fluticasone, ketoconazole, meloxicam, metoprolol succinate, montelukast, multivitamin with minerals, pregabalin, venlafaxine xr, [DISCONTINUED] diclofenac sodium, diclofenac sodium, hydroxyzine, oxycodone, [START ON 03/27/2019] oxycodone, and [START ON 04/26/2019] oxycodone. He  reports that he has quit smoking. His smoking use included cigars. He quit after 4.00 years of use. He uses smokeless tobacco. He reports current alcohol use of about 18.0 standard drinks of alcohol per week. He reports that he does not use drugs. Matthew Maldonado has No Known Allergies.   HPI  Today, he is being contacted for medication management.  The patient indicates doing well with the current medication regimen. No adverse reactions or side effects reported to the medications.  The patient indicates that his left lower extremity pain has returned.  He indicates not having any low back pain and the pain seems to start in the area of the buttocks and goes down through the back and lateral aspect of the leg to the level just below the knee.  In the past, we have done a left L3-4 LESI with excellent results.  He wants to know if we can schedule him to come in to have that repeated.  In addition, he refers experiencing a lot of pain in the area of the left hip.  I review of the x-rays available would suggest that he has degenerative joint disease affecting the left hip.  He has had this problem for a while and he has  requested that we consider doing an MRI of the hip.  Today I had the him  perform a Patrick maneuver and he tested positive for left hip arthralgia, possibly due to to an arthropathy affecting that left hip.  In view of this on the fact that he has not improved, we will go ahead and order an MRI of the hip to see if there is any pathology that can be surgically corrected.  I will schedule him to come in for a left-sided L3-4 LESI, as soon as possible.  Today he also requested to have his Voltaren gel concentration increased since he seems to think that 1% is not really doing the trick.  I do not see any problems with this and therefore I have taken care of sending a refill for the 3%.  Pharmacotherapy Assessment  Analgesic: Oxycodone IR 5 mg, 1-2 tabs PO HS (10 mg/day of oxycodone) MME/day: 15 mg/day.   Monitoring: Pharmacotherapy: No side-effects or adverse reactions reported. [place] PMP: PDMP reviewed during this encounter.       Compliance: No problems identified. Effectiveness: Clinically acceptable. Plan: Refer to "POC".  UDS: No results found for: SUMMARY Laboratory Chemistry Profile (12 mo)  Renal: 12/26/2018: BUN 25; BUN/Creatinine Ratio 29; Creatinine, Ser 0.85  Lab Results  Component Value Date   GFRAA 103 12/26/2018   GFRNONAA 90 12/26/2018   Hepatic: 12/26/2018: Albumin 4.6 Lab Results  Component Value Date   AST 32 12/26/2018   ALT 40 09/06/2017   Other: 12/26/2018: 25-Hydroxy, Vitamin D 43; 25-Hydroxy, Vitamin D-2 <1.0; 25-Hydroxy, Vitamin D-3 43; CRP 1; Sed Rate 16; Vitamin B-12 648 Note: Above Lab results reviewed.  Imaging  DG PAIN CLINIC C-ARM 1-60 MIN NO REPORT Fluoro was used, but no Radiologist interpretation will be provided.  Please refer to "NOTES" tab for provider progress note.   Assessment  The primary encounter diagnosis was Chronic pain syndrome. Diagnoses of Chronic hip pain (Left), Osteoarthritis of hip (Left), Osteoarthritis involving multiple joints, Neurogenic pain, DDD (degenerative disc disease), lumbosacral, and  Chronic lumbar radiculopathy (Left) were also pertinent to this visit.  Plan of Care  Problem-specific:  No problem-specific Assessment & Plan notes found for this encounter.  I have discontinued Matthew Maldonado's diclofenac sodium. I am also having him start on oxyCODONE, oxyCODONE, and Diclofenac Sodium. Additionally, I am having him maintain his multivitamin with minerals, fluticasone, ketoconazole, esomeprazole, hydrOXYzine, montelukast, cyclobenzaprine, venlafaxine XR, metoprolol succinate, atorvastatin, busPIRone, oxyCODONE, meloxicam, and pregabalin.  Pharmacotherapy (Medications Ordered): Meds ordered this encounter  Medications  . oxyCODONE (OXY IR/ROXICODONE) 5 MG immediate release tablet    Sig: Take 1-2 tablets (5-10 mg total) by mouth at bedtime as needed for severe pain. Must last 30 days.    Dispense:  60 tablet    Refill:  0    Chronic Pain: STOP Act (Not applicable) Fill 1 day early if closed on refill date. Do not fill until: 02/25/2019. To last until: 03/27/2019. Avoid benzodiazepines within 8 hours of opioids  . meloxicam (MOBIC) 15 MG tablet    Sig: Take 1 tablet (15 mg total) by mouth daily.    Dispense:  30 tablet    Refill:  5    Fill one day early if pharmacy is closed on scheduled refill date. May substitute for generic if available.  . pregabalin (LYRICA) 50 MG capsule    Sig: Take 2 capsules (100 mg total) by mouth at bedtime AND 1 capsule (50 mg total) daily.    Dispense:  90 capsule    Refill:  5    Fill one day early if pharmacy is closed on scheduled refill date. May substitute for generic if available.  Marland Kitchen oxyCODONE (OXY IR/ROXICODONE) 5 MG immediate release tablet    Sig: Take 1-2 tablets (5-10 mg total) by mouth at bedtime as needed for severe pain. Must last 30 days.    Dispense:  60 tablet    Refill:  0    Chronic Pain: STOP Act (Not applicable) Fill 1 day early if closed on refill date. Do not fill until: 03/27/2019. To last until: 04/26/2019. Avoid  benzodiazepines within 8 hours of opioids  . oxyCODONE (OXY IR/ROXICODONE) 5 MG immediate release tablet    Sig: Take 1-2 tablets (5-10 mg total) by mouth at bedtime as needed for severe pain. Must last 30 days.    Dispense:  60 tablet    Refill:  0    Chronic Pain: STOP Act (Not applicable) Fill 1 day early if closed on refill date. Do not fill until: 04/26/2019. To last until: 05/26/2019. Avoid benzodiazepines within 8 hours of opioids  . Diclofenac Sodium 3 % GEL    Sig: Place 4 g onto the skin 4 (four) times daily.    Dispense:  100 g    Refill:  10    Fill one day early if pharmacy is closed on scheduled refill date. May substitute for generic if available.   Orders:  Orders Placed This Encounter  Procedures  . LESI (Schedule)    Standing Status:   Future    Standing Expiration Date:   03/28/2019    Scheduling Instructions:     Procedure: Interlaminar Lumbar Epidural Steroid injection (LESI)  L3-4     Laterality: Left-sided     Sedation: Patient's choice.     Timeframe: ASAA    Order Specific Question:   Where will this procedure be performed?    Answer:   [facility]  . LESI (PRN)    Standing Status:   Standing    Number of Occurrences:   9    Standing Expiration Date:   08/25/2020    Scheduling Instructions:     Purpose: Palliative     Indication: Lower extremity pain/Sciatica left (M54.32).     Side: Left-sided     Level: L3-4     Sedation: Patient's choice.     TIMEFRAME: PRN procedure. (Mr. Coulibaly will call when needed.)    Order Specific Question:   Where will this procedure be performed?    Answer:   [facility]  . MR HIP LEFT WO CONTRAST    Standing Status:   Future    Standing Expiration Date:   04/27/2020    Order Specific Question:   What is the patient's sedation requirement?    Answer:   No Sedation    Order Specific Question:   Does the patient have a pacemaker or implanted devices?    Answer:   No    Order Specific Question:   Preferred  imaging location?    Answer:   [facility] (table limit-400lbs)    Order Specific Question:   Radiology Contrast Protocol - do NOT remove file path    Answer:   [email or web address]    Order Specific Question:   ** REASON FOR EXAM (FREE TEXT)    Answer:   Persistent chronic hip pain with DJD evidence on x-ray.  Failure of conservative therapy.   Follow-up plan:   Return in  about 3 months (around 05/22/2019) for (VV), (MM), in addition, Procedure (w/ sedation): (L) L3-4 LESI #2.      Interventional management options: Procedure(s) under consideration:  Diagnostic left-sided lumbar epidural steroid injection under fluoroscopic guidance and IV sedation.       Recent Visits No visits were found meeting these conditions.  Showing recent visits within past 90 days and meeting all other requirements   Today's Visits Date Type Provider Dept  02/25/19 Telemedicine [name], MD [facility]  Showing today's visits and meeting all other requirements   Future Appointments No visits were found meeting these conditions.  Showing future appointments within next 90 days and meeting all other requirements   I discussed the assessment and treatment plan with the patient. The patient was provided an opportunity to ask questions and all were answered. The patient agreed with the plan and demonstrated an understanding of the instructions.  Patient advised to call back or seek an in-person evaluation if the symptoms or condition worsens.  Total duration of non-face-to-face encounter: 20 minutes.  Note by: Gaspar Cola, MD Date: 02/25/2019; Time: 2:22 PM  Note: This dictation was prepared with Dragon dictation. Any transcriptional errors that may result from this process are unintentional.  Disclaimer:  * Given the special circumstances of the COVID-19 pandemic, the federal government has announced that the Office for Civil Rights (OCR) will  exercise its enforcement discretion and will not impose penalties on physicians using telehealth in the event of noncompliance with regulatory requirements under the [place] and [place] (HIPAA) in connection with the good faith provision of telehealth during the XX123456 national public health emergency. ([place])

## 2019-02-25 ENCOUNTER — Other Ambulatory Visit: Payer: Self-pay

## 2019-02-25 ENCOUNTER — Telehealth: Payer: Self-pay | Admitting: *Deleted

## 2019-02-25 ENCOUNTER — Ambulatory Visit: Payer: Medicare Other | Attending: Pain Medicine | Admitting: Pain Medicine

## 2019-02-25 DIAGNOSIS — M159 Polyosteoarthritis, unspecified: Secondary | ICD-10-CM

## 2019-02-25 DIAGNOSIS — M1612 Unilateral primary osteoarthritis, left hip: Secondary | ICD-10-CM

## 2019-02-25 DIAGNOSIS — M5416 Radiculopathy, lumbar region: Secondary | ICD-10-CM | POA: Diagnosis not present

## 2019-02-25 DIAGNOSIS — G8929 Other chronic pain: Secondary | ICD-10-CM | POA: Diagnosis not present

## 2019-02-25 DIAGNOSIS — M5137 Other intervertebral disc degeneration, lumbosacral region: Secondary | ICD-10-CM

## 2019-02-25 DIAGNOSIS — M25552 Pain in left hip: Secondary | ICD-10-CM

## 2019-02-25 DIAGNOSIS — M792 Neuralgia and neuritis, unspecified: Secondary | ICD-10-CM | POA: Diagnosis not present

## 2019-02-25 DIAGNOSIS — M8949 Other hypertrophic osteoarthropathy, multiple sites: Secondary | ICD-10-CM | POA: Diagnosis not present

## 2019-02-25 DIAGNOSIS — G894 Chronic pain syndrome: Secondary | ICD-10-CM | POA: Diagnosis not present

## 2019-02-25 MED ORDER — OXYCODONE HCL 5 MG PO TABS: 5.0000 mg | ORAL_TABLET | Freq: Every evening | ORAL | 0 refills | Status: DC | PRN

## 2019-02-25 MED ORDER — PREGABALIN 50 MG PO CAPS: ORAL_CAPSULE | ORAL | 5 refills | Status: DC

## 2019-02-25 MED ORDER — MELOXICAM 15 MG PO TABS: 15.0000 mg | ORAL_TABLET | Freq: Every day | ORAL | 5 refills | Status: DC

## 2019-02-25 MED ORDER — DICLOFENAC SODIUM 3 % EX GEL: 4.0000 g | Freq: Four times a day (QID) | CUTANEOUS | 10 refills | Status: DC

## 2019-02-26 ENCOUNTER — Ambulatory Visit
Admission: RE | Admit: 2019-02-26 | Discharge: 2019-02-26 | Disposition: A | Discharge status: Home / Self Care | Payer: Medicare Other | Source: Ambulatory Visit | Attending: Pain Medicine | Admitting: Pain Medicine

## 2019-02-26 ENCOUNTER — Other Ambulatory Visit: Payer: Self-pay | Admitting: Pain Medicine

## 2019-02-26 ENCOUNTER — Encounter: Payer: Self-pay | Admitting: Pain Medicine

## 2019-02-26 ENCOUNTER — Other Ambulatory Visit: Payer: Self-pay

## 2019-02-26 ENCOUNTER — Ambulatory Visit (HOSPITAL_BASED_OUTPATIENT_CLINIC_OR_DEPARTMENT_OTHER): Payer: Medicare Other | Admitting: Pain Medicine

## 2019-02-26 VITALS — BP 124/80 | HR 57 | Temp 97.7°F | Resp 9 | Ht 69.0 in | Wt 201.0 lb

## 2019-02-26 DIAGNOSIS — M5137 Other intervertebral disc degeneration, lumbosacral region: Secondary | ICD-10-CM | POA: Insufficient documentation

## 2019-02-26 DIAGNOSIS — M79604 Pain in right leg: Secondary | ICD-10-CM | POA: Diagnosis present

## 2019-02-26 DIAGNOSIS — G8929 Other chronic pain: Secondary | ICD-10-CM | POA: Diagnosis present

## 2019-02-26 DIAGNOSIS — M5416 Radiculopathy, lumbar region: Secondary | ICD-10-CM | POA: Diagnosis present

## 2019-02-26 DIAGNOSIS — M79605 Pain in left leg: Secondary | ICD-10-CM | POA: Insufficient documentation

## 2019-02-26 MED ORDER — LIDOCAINE HCL 2 % IJ SOLN
20.0000 mL | Freq: Once | INTRAMUSCULAR | Status: AC
  Administered 2019-02-26: 400 mg
  Filled 2019-02-26: qty 20

## 2019-02-26 MED ORDER — MIDAZOLAM HCL 5 MG/5ML IJ SOLN
1.0000 mg | INTRAMUSCULAR | Status: DC | PRN
  Filled 2019-02-26: qty 5

## 2019-02-26 MED ORDER — ROPIVACAINE HCL 2 MG/ML IJ SOLN
2.0000 mL | Freq: Once | INTRAMUSCULAR | Status: AC
  Administered 2019-02-26: 10 mL via EPIDURAL
  Filled 2019-02-26: qty 10

## 2019-02-26 MED ORDER — SODIUM CHLORIDE 0.9% FLUSH
2.0000 mL | Freq: Once | INTRAVENOUS | Status: AC
  Administered 2019-02-26: 10 mL

## 2019-02-26 MED ORDER — SODIUM CHLORIDE (PF) 0.9 % IJ SOLN
INTRAMUSCULAR | Status: AC
  Filled 2019-02-26: qty 10

## 2019-02-26 MED ORDER — LACTATED RINGERS IV SOLN: 1000.0000 mL | Freq: Once | INTRAVENOUS | Status: DC

## 2019-02-26 MED ORDER — FENTANYL CITRATE (PF) 100 MCG/2ML IJ SOLN
25.0000 ug | INTRAMUSCULAR | Status: DC | PRN
  Filled 2019-02-26: qty 2

## 2019-02-26 MED ORDER — TRIAMCINOLONE ACETONIDE 40 MG/ML IJ SUSP
40.0000 mg | Freq: Once | INTRAMUSCULAR | Status: AC
  Administered 2019-02-26: 40 mg
  Filled 2019-02-26: qty 1

## 2019-02-26 MED ORDER — IOHEXOL 180 MG/ML  SOLN
10.0000 mL | Freq: Once | INTRAMUSCULAR | Status: AC
  Administered 2019-02-26: 09:00:00 10 mL via EPIDURAL
  Filled 2019-02-26: qty 20

## 2019-02-26 NOTE — Progress Notes (Signed)
Safety precautions to be maintained throughout the outpatient stay will include: orient to surroundings, keep bed in low position, maintain call bell within reach at all times, provide assistance with transfer out of bed and ambulation.  

## 2019-02-26 NOTE — Patient Instructions (Signed)

## 2019-02-26 NOTE — Progress Notes (Signed)
Disclaimer: In compliance with Federal Rules mandating open notes, implemented by the [facility], these notes are made available to patients through the electronic medical record. Information contained herein reflects the providers review of information obtained during the pre-charting review of records, as well as notes taken during the patients appointment.  Although all data contained herein was reviewed by the provider, unless specifically stated, due to time constrains, not all of it was discussed with the patient during the appointment. Specific medical language and abbreviations used within medical records is meant to communicate precise data to individuals trained to interpret such data.  Warning: These encounter notes are not meant to serve the purpose of providing patients with clear and concise information on their conditions, treatment plan, or specific risks and possible complications. Such information is provided to patients under the encounter's "Patient Information" section. Interpretation of the information contained herein should be left to individuals with the necessary training to understand the data.  Patient's Name: Matthew Maldonado  MRN: [id number]  Referring Provider: Nobie Putnam *  DOB: [birth year]  PCP: Olin Hauser, DO  DOS: 02/26/2019  Note by: Gaspar Cola, MD  Service setting: Ambulatory outpatient  Specialty: Interventional Pain Management  Patient type: Established  Location: [facility] (AMB) Pain Management Facility  Visit type: Interventional Procedure   Primary Reason for Visit: Interventional Pain Management Treatment. CC: Hip Pain (left) and Knee Pain (left)  Procedure:          Anesthesia, Analgesia, Anxiolysis:  Type: Therapeutic Inter-Laminar Epidural Steroid Injection  #2  Region: Lumbar Level: L3-4 Level. Laterality: Left-Sided Paramedial  Type: Local Anesthesia Indication(s): Analgesia         Route: Infiltration ([place]/IM) IV  Access: Declined Sedation: Declined  Local Anesthetic: Lidocaine 1-2%  Position: Prone with head of the table was raised to facilitate breathing.   Indications: 1. DDD (degenerative disc disease), lumbosacral   2. Chronic pain of lower extremity (Primary area of Pain) (Bilateral) (L>R)   3. Chronic lumbar radiculopathy (Left)    Pain Score: Pre-procedure: 8 /10 Post-procedure: 0-No pain/10   Pre-op Assessment:  Mr. Lefton is a 68 y.o. (year old), male patient, seen today for interventional treatment. He  has a past surgical history that includes Renal biopsy; Nasal septoplasty w/ turbinoplasty (Bilateral, 03/22/2018); Nasopharyngoscopy eustation tube balloon dilation (Bilateral, 03/22/2018); Nasal endoscopy (Bilateral, 03/22/2018); and Excision nasal mass (Left, 03/22/2018). Mr. Broers has a current medication list which includes the following prescription(s): atorvastatin, buspirone, diclofenac sodium, esomeprazole, fluticasone, ketoconazole, meloxicam, metoprolol succinate, montelukast, multivitamin with minerals, oxycodone, [START ON 03/27/2019] oxycodone, [START ON 04/26/2019] oxycodone, pregabalin, venlafaxine xr, cyclobenzaprine, hydroxyzine, and [DISCONTINUED] diclofenac sodium. His primarily concern today is the Hip Pain (left) and Knee Pain (left)  Initial Vital Signs:  Pulse/HCG Rate: (!) 57ECG Heart Rate: (!) 54 Temp: 97.7 F (36.5 C) Resp: 16 BP: 122/86 SpO2: 97 %  BMI: Estimated body mass index is 29.68 kg/m as calculated from the following:   Height as of this encounter: 5\' 9"  (1.753 m).   Weight as of this encounter: 201 lb (91.2 kg).  Risk Assessment: Allergies: Reviewed. He has No Known Allergies.  Allergy Precautions: None required Coagulopathies: Reviewed. None identified.  Blood-thinner therapy: None at this time Active Infection(s): Reviewed. None identified. Mr. Levins is afebrile  Site Confirmation: Mr. Thebo was asked to confirm the procedure and laterality before marking  the site Procedure checklist: Completed Consent: Before the procedure and under the influence of no sedative(s), amnesic(s), or  anxiolytics, the patient was informed of the treatment options, risks and possible complications. To fulfill our ethical and legal obligations, as recommended by the American Medical Association's Code of Ethics, I have informed the patient of my clinical impression; the nature and purpose of the treatment or procedure; the risks, benefits, and possible complications of the intervention; the alternatives, including doing nothing; the risk(s) and benefit(s) of the alternative treatment(s) or procedure(s); and the risk(s) and benefit(s) of doing nothing. The patient was provided information about the general risks and possible complications associated with the procedure. These may include, but are not limited to: failure to achieve desired goals, infection, bleeding, organ or nerve damage, allergic reactions, paralysis, and death. In addition, the patient was informed of those risks and complications associated to Spine-related procedures, such as failure to decrease pain; infection (i.e.: Meningitis, epidural or intraspinal abscess); bleeding (i.e.: epidural hematoma, subarachnoid hemorrhage, or any other type of intraspinal or peri-dural bleeding); organ or nerve damage (i.e.: Any type of peripheral nerve, nerve root, or spinal cord injury) with subsequent damage to sensory, motor, and/or autonomic systems, resulting in permanent pain, numbness, and/or weakness of one or several areas of the body; allergic reactions; (i.e.: anaphylactic reaction); and/or death. Furthermore, the patient was informed of those risks and complications associated with the medications. These include, but are not limited to: allergic reactions (i.e.: anaphylactic or anaphylactoid reaction(s)); adrenal axis suppression; blood sugar elevation that in diabetics may result in ketoacidosis or comma; water  retention that in patients with history of congestive heart failure may result in shortness of breath, pulmonary edema, and decompensation with resultant heart failure; weight gain; swelling or edema; medication-induced neural toxicity; particulate matter embolism and blood vessel occlusion with resultant organ, and/or nervous system infarction; and/or aseptic necrosis of one or more joints. Finally, the patient was informed that Medicine is not an exact science; therefore, there is also the possibility of unforeseen or unpredictable risks and/or possible complications that may result in a catastrophic outcome. The patient indicated having understood very clearly. We have given the patient no guarantees and we have made no promises. Enough time was given to the patient to ask questions, all of which were answered to the patient's satisfaction. Mr. Hartzog has indicated that he wanted to continue with the procedure. Attestation: I, the ordering provider, attest that I have discussed with the patient the benefits, risks, side-effects, alternatives, likelihood of achieving goals, and potential problems during recovery for the procedure that I have provided informed consent. Date  Time: 02/26/2019  8:15 AM  Pre-Procedure Preparation:  Monitoring: As per clinic protocol. Respiration, ETCO2, SpO2, BP, heart rate and rhythm monitor placed and checked for adequate function Safety Precautions: Patient was assessed for positional comfort and pressure points before starting the procedure. Time-out: I initiated and conducted the "Time-out" before starting the procedure, as per protocol. The patient was asked to participate by confirming the accuracy of the "Time Out" information. Verification of the correct person, site, and procedure were performed and confirmed by me, the nursing staff, and the patient. "Time-out" conducted as per Joint Commission's Universal Protocol (UP.01.01.01). Time: 0905  Description of  Procedure:          Target Area: The interlaminar space, initially targeting the lower laminar border of the superior vertebral body. Approach: Paramedial approach. Area Prepped: Entire Posterior Lumbar Region Prepping solution: DuraPrep (Iodine Povacrylex [0.7% available iodine] and Isopropyl Alcohol, 74% w/w) Safety Precautions: Aspiration looking for blood return was conducted prior to all injections.  At no point did we inject any substances, as a needle was being advanced. No attempts were made at seeking any paresthesias. Safe injection practices and needle disposal techniques used. Medications properly checked for expiration dates. SDV (single dose vial) medications used. Description of the Procedure: Protocol guidelines were followed. The procedure needle was introduced through the skin, ipsilateral to the reported pain, and advanced to the target area. Bone was contacted and the needle walked caudad, until the lamina was cleared. The epidural space was identified using "loss-of-resistance technique" with 2-3 ml of PF-NaCl (0.9% NSS), in a 5cc LOR glass syringe.  Vitals:   02/26/19 0900 02/26/19 0905 02/26/19 0910 02/26/19 0915  BP: (!) 134/92 (!) 136/94 115/80 124/80  Pulse:      Resp: (!) 9 (!) 9 (!) 7 (!) 9  Temp:      TempSrc:      SpO2: 97% 97% 98% 95%  Weight:      Height:        Start Time: 0905 hrs. End Time: 0912 hrs.  Materials:  Needle(s) Type: Epidural needle Gauge: 17G Length: 3.5-in Medication(s): Please see orders for medications and dosing details.  Imaging Guidance (Spinal):          Type of Imaging Technique: Fluoroscopy Guidance (Spinal) Indication(s): Assistance in needle guidance and placement for procedures requiring needle placement in or near specific anatomical locations not easily accessible without such assistance. Exposure Time: Please see nurses notes. Contrast: Before injecting any contrast, we confirmed that the patient did not have an allergy to  iodine, shellfish, or radiological contrast. Once satisfactory needle placement was completed at the desired level, radiological contrast was injected. Contrast injected under live fluoroscopy. No contrast complications. See chart for type and volume of contrast used. Fluoroscopic Guidance: I was personally present during the use of fluoroscopy. "Tunnel Vision Technique" used to obtain the best possible view of the target area. Parallax error corrected before commencing the procedure. "Direction-depth-direction" technique used to introduce the needle under continuous pulsed fluoroscopy. Once target was reached, antero-posterior, oblique, and lateral fluoroscopic projection used confirm needle placement in all planes. Images permanently stored in EMR. Interpretation: I personally interpreted the imaging intraoperatively. Adequate needle placement confirmed in multiple planes. Appropriate spread of contrast into desired area was observed. No evidence of afferent or efferent intravascular uptake. No intrathecal or subarachnoid spread observed. Permanent images saved into the patient's record.  Antibiotic Prophylaxis:   Anti-infectives (From admission, onward)   None     Indication(s): None identified  Post-operative Assessment:  Post-procedure Vital Signs:  Pulse/HCG Rate: (!) 57(!) 54 Temp: 97.7 F (36.5 C) Resp: (!) 9 BP: 124/80 SpO2: 95 %  EBL: None  Complications: No immediate post-treatment complications observed by team, or reported by patient.  Note: The patient tolerated the entire procedure well. A repeat set of vitals were taken after the procedure and the patient was kept under observation following institutional policy, for this type of procedure. Post-procedural neurological assessment was performed, showing return to baseline, prior to discharge. The patient was provided with post-procedure discharge instructions, including a section on how to identify potential problems. Should  any problems arise concerning this procedure, the patient was given instructions to immediately contact us, at any time, without hesitation. In any case, we plan to contact the patient by telephone for a follow-up status report regarding this interventional procedure.  Comments:  No additional relevant information.  Plan of Care  Orders:  Orders Placed This Encounter  Procedures  .  LESI (Today)    Scheduling Instructions:     Procedure: Interlaminar LESI L3-4     Laterality: Left-sided     Sedation: Patient's choice     Timeframe:  Today    Order Specific Question:   Where will this procedure be performed?    Answer:   [facility]  . Fluoro (C-Arm) (<60 min) (No Report)    Intraoperative interpretation by procedural physician at [place].    Standing Status:   Standing    Number of Occurrences:   1    Order Specific Question:   Reason for exam:    Answer:   Assistance in needle guidance and placement for procedures requiring needle placement in or near specific anatomical locations not easily accessible without such assistance.  . Consent: LESI    Provider Attestation: I, Alexandria Dossie Arbour, MD, (Pain Management Specialist), the physician/practitioner, attest that I have discussed with the patient the benefits, risks, side effects, alternatives, likelihood of achieving goals and potential problems during recovery for the procedure that I have provided informed consent.    Scheduling Instructions:     Procedure: Lumbar epidural steroid injection under fluoroscopic guidance     Indications: Low back and/or lower extremity pain secondary to lumbar radiculitis     Note: Always confirm laterality of pain with Mr. Cottingham, before procedure.     Transcribe to consent form and obtain patient signature.  Marland Kitchen Epidural Tray    Equipment required: Single use, disposable, "Epidural Tray" Epidural Catheter: NOT required    Standing Status:   Standing    Number of Occurrences:    1    Order Specific Question:   Specify    Answer:   Epidural Tray   Chronic Opioid Analgesic:  Oxycodone IR 5 mg, 1-2 tabs PO HS (10 mg/day of oxycodone) MME/day: 15 mg/day.   Medications ordered for procedure: Meds ordered this encounter  Medications  . iohexol (OMNIPAQUE) 180 MG/ML injection 10 mL    Must be Myelogram-compatible. If not available, you may substitute with a water-soluble, non-ionic, hypoallergenic, myelogram-compatible radiological contrast medium.  Marland Kitchen lidocaine (XYLOCAINE) 2 % (with pres) injection 400 mg  . DISCONTD: lactated ringers infusion 1,000 mL  . DISCONTD: midazolam (VERSED) 5 MG/5ML injection 1-2 mg    Make sure Flumazenil is available in the pyxis when using this medication. If oversedation occurs, administer 0.2 mg IV over 15 sec. If after 45 sec no response, administer 0.2 mg again over 1 min; may repeat at 1 min intervals; not to exceed 4 doses (1 mg)  . DISCONTD: fentaNYL (SUBLIMAZE) injection 25-50 mcg    Make sure Narcan is available in the pyxis when using this medication. In the event of respiratory depression (RR< 8/min): Titrate NARCAN (naloxone) in increments of 0.1 to 0.2 mg IV at 2-3 minute intervals, until desired degree of reversal.  . sodium chloride flush (NS) 0.9 % injection 2 mL  . ropivacaine (PF) 2 mg/mL (0.2%) (NAROPIN) injection 2 mL  . triamcinolone acetonide (KENALOG-40) injection 40 mg   Medications administered: We administered iohexol, lidocaine, sodium chloride flush, ropivacaine (PF) 2 mg/mL (0.2%), and triamcinolone acetonide.  See the medical record for exact dosing, route, and time of administration.  Follow-up plan:   Return in about 2 weeks (around 03/12/2019) for (VV), (PP).       Interventional management options: Procedure(s) under consideration:  Diagnostic left-sided lumbar epidural steroid injection under fluoroscopic guidance and IV sedation.  Recent Visits Date Type Provider Dept  02/25/19 Telemedicine  [name], MD [facility]  Showing recent visits within past 90 days and meeting all other requirements   Today's Visits Date Type Provider Dept  02/26/19 Procedure visit [name], MD [facility]  Showing today's visits and meeting all other requirements   Future Appointments Date Type Provider Dept  03/12/19 Appointment [name], [facility]  05/22/19 Appointment [name], MD [facility]  Showing future appointments within next 90 days and meeting all other requirements   Disposition: Discharge home  Discharge Date & Time: 02/26/2019; 0919 hrs.   Primary Care Physician: Olin Hauser, DO Location: [facility] Note by: Gaspar Cola, MD Date: 02/26/2019; Time: 9:32 AM  Disclaimer:  Medicine is not an exact science. The only guarantee in medicine is that nothing is guaranteed. It is important to note that the decision to proceed with this intervention was based on the information collected from the patient. The Data and conclusions were drawn from the patient's questionnaire, the interview, and the physical examination. Because the information was provided in large part by the patient, it cannot be guaranteed that it has not been purposely or unconsciously manipulated. Every effort has been made to obtain as much relevant data as possible for this evaluation. It is important to note that the conclusions that lead to this procedure are derived in large part from the available data. Always take into account that the treatment will also be dependent on availability of resources and existing treatment guidelines, considered by other Pain Management Practitioners as being common knowledge and practice, at the time of the intervention. For Medico-Legal purposes, it is also important to point out that variation in procedural techniques and pharmacological choices are the  acceptable norm. The indications, contraindications, technique, and results of the above procedure should only be interpreted and judged by a Board-Certified Interventional Pain Specialist with extensive familiarity and expertise in the same exact procedure and technique.

## 2019-02-27 ENCOUNTER — Telehealth: Payer: Self-pay

## 2019-02-27 DIAGNOSIS — C44629 Squamous cell carcinoma of skin of left upper limb, including shoulder: Secondary | ICD-10-CM | POA: Diagnosis not present

## 2019-02-27 DIAGNOSIS — D0462 Carcinoma in situ of skin of left upper limb, including shoulder: Secondary | ICD-10-CM | POA: Diagnosis not present

## 2019-02-27 NOTE — Telephone Encounter (Signed)
States he is doing better and slept well last night. He did question about how to get the Duraprep off his back.

## 2019-02-28 ENCOUNTER — Other Ambulatory Visit: Payer: Self-pay | Admitting: Family Medicine

## 2019-02-28 DIAGNOSIS — F411 Generalized anxiety disorder: Secondary | ICD-10-CM

## 2019-02-28 DIAGNOSIS — F338 Other recurrent depressive disorders: Secondary | ICD-10-CM

## 2019-03-01 ENCOUNTER — Telehealth: Payer: Self-pay | Admitting: Family Medicine

## 2019-03-01 NOTE — Telephone Encounter (Signed)
I left a message asking the patient to call and schedule AWV with Tiffany. Last AWV 08/02/17 so patient can be scheduled at next available opening. VDM (DD)

## 2019-03-10 NOTE — Progress Notes (Signed)
Virtual Encounter - Pain Management PROVIDER NOTE: Information contained herein reflects review and annotations entered in association with encounter. Interpretation of such information and data should be left to medically-trained personnel. Information provided to patient can be located elsewhere in the medical record under "Patient Instructions". Document created using STT-dictation technology, any transcriptional errors that may result from process are unintentional.    Contact & Pharmacy Preferred: [phone] Home: [phone] (home) Mobile: [phone] (mobile) E-mail: [email or web address]  [place], [place]. [place] [address] Phone: [phone] Fax: [phone]   Pre-screening  Mr. Meckel offered "in-person" vs "virtual" encounter. He indicated preferring virtual for this encounter.   Reason COVID-19*  Social distancing based on CDC and AMA recommendations.   I contacted [name] on 03/12/2019 via telephone.      I clearly identified myself as Gaspar Cola, MD. I verified that I was speaking with the correct person using two identifiers (Name: DONALD NORDMARK, and date of birth: 1950/05/25).  Consent I sought verbal advanced consent from [name] for virtual visit interactions. I informed Mr. Touray of possible security and privacy concerns, risks, and limitations associated with providing "not-in-person" medical evaluation and management services. I also informed Mr. Holdren of the availability of "in-person" appointments. Finally, I informed him that there would be a charge for the virtual visit and that he could be  personally, fully or partially, financially responsible for it. Mr. Bartolucci expressed understanding and agreed to proceed.   Historic Elements   Mr. BIRCH SCHETTER is a 68 y.o. year old, male patient evaluated today after his last encounter by our practice on 02/27/2019. Mr. Abraha  has a past medical history of  Anxiety, Arthritis, Blood vessel disorder, BPH (benign prostatic hyperplasia), GERD (gastroesophageal reflux disease), Insomnia, Minimal change disease, Nocturia, PSVT (paroxysmal supraventricular tachycardia) ([facility]), and TIA (transient ischemic attack). He also  has a past surgical history that includes Renal biopsy; Nasal septoplasty w/ turbinoplasty (Bilateral, 03/22/2018); Nasopharyngoscopy eustation tube balloon dilation (Bilateral, 03/22/2018); Nasal endoscopy (Bilateral, 03/22/2018); and Excision nasal mass (Left, 03/22/2018). Mr. Kosky has a current medication list which includes the following prescription(s): atorvastatin, buspirone, diclofenac sodium, esomeprazole, fluticasone, ketoconazole, meloxicam, metoprolol succinate, montelukast, multivitamin with minerals, [START ON 03/27/2019] oxycodone, [START ON 04/26/2019] oxycodone, [START ON 05/26/2019] oxycodone, pregabalin, venlafaxine xr, cyclobenzaprine, and hydroxyzine. He  reports that he has quit smoking. His smoking use included cigars. He quit after 4.00 years of use. He uses smokeless tobacco. He reports current alcohol use of about 18.0 standard drinks of alcohol per week. He reports that he does not use drugs. Mr. Fei has No Known Allergies.   HPI  Today, he is being contacted for a post-procedure assessment.  According to the patient, he is doing great after this second LESI.  The first 1 was done on 09/25/2018 and it provided him with complete relief of the pain until around 02/25/2019 when he started experiencing a recurrence of the pain.  He again has obtained excellent relief with this 1.  Since this seems to work well for him and provide him with at least 6 months of pain relief, I will go ahead and put a standing order to have this repeated PRN, if and when the pain returns.  Post-Procedure Evaluation  Procedure (02/26/2019): Therapeutic left L3-4 LESI #2 under fluoroscopic guidance, no sedation Pre-procedure pain level:  8/10 Post-procedure:  0/10 (100% relief)  Sedation: None.  Janett Billow, RN  03/11/2019 11:57 AM  Sign when Signing Visit Pain relief after procedure (treated area only): (Questions asked to patient) 1. Starting about 15 minutes after the procedure, and "while the area was still numb" (from the local anesthetics), were you having any of your usual pain "in that area" (the treated area)?  (NOTE: NOT including the discomfort from the needle sticks.) First 1 hour: 100 % better. First 4-6 hours: 100 % better. 2. How long did the numbness from the local anesthetics last? (More than 6 hours?) Duration: 24 hours.  3. How much better is your pain now, when compared to before the procedure? Current benefit: 100 % better. 4. Can you move better now? Improvement in ROM (Range of Motion): Yes. 5. Can you do more now? Improvement in function: Yes. 4. Did you have any problems with the procedure? Side-effects/Complications: No.  Current benefits: Defined as benefit that persist at this time.   Analgesia:  90-100% better Function: Mr. Locurto reports improvement in function ROM: Mr. Salonga reports improvement in ROM  Pharmacotherapy Assessment  Analgesic: Oxycodone IR 5 mg, 1-2 tabs PO HS (10 mg/day of oxycodone) MME/day: 15 mg/day.   Monitoring: Pharmacotherapy: No side-effects or adverse reactions reported. [place] PMP: PDMP reviewed during this encounter.       Compliance: No problems identified. Effectiveness: Clinically acceptable. Plan: Refer to "POC".  UDS: No results found for: SUMMARY Laboratory Chemistry Profile (12 mo)  Renal: 12/26/2018: BUN 25; BUN/Creatinine Ratio 29; Creatinine, Ser 0.85  Lab Results  Component Value Date   GFRAA 103 12/26/2018   GFRNONAA 90 12/26/2018   Hepatic: 12/26/2018: Albumin 4.6 Lab Results  Component Value Date   AST 32 12/26/2018   ALT 40 09/06/2017   Other: 12/26/2018: 25-Hydroxy, Vitamin D 43; 25-Hydroxy, Vitamin D-2 <1.0; 25-Hydroxy, Vitamin D-3 43; CRP 1; Sed  Rate 16; Vitamin B-12 648 Note: Above Lab results reviewed.  Imaging  Fluoro (C-Arm) (<60 min) (No Report) Fluoro was used, but no Radiologist interpretation will be provided.  Please refer to "NOTES" tab for provider progress note.   Assessment  The primary encounter diagnosis was Chronic low back pain (Bilateral) w/ sciatica (Bilateral) (L>R). Diagnoses of Chronic pain of lower extremity (Primary area of Pain) (Bilateral) (L>R), Chronic pain syndrome, and Chronic musculoskeletal pain were also pertinent to this visit.  Plan of Care  Problem-specific:  No problem-specific Assessment & Plan notes found for this encounter.  I am having Lanny Hurst A. Seim maintain his multivitamin with minerals, fluticasone, ketoconazole, esomeprazole, hydrOXYzine, montelukast, metoprolol succinate, atorvastatin, busPIRone, meloxicam, pregabalin, oxyCODONE, oxyCODONE, Diclofenac Sodium, venlafaxine XR, cyclobenzaprine, and oxyCODONE.  Pharmacotherapy (Medications Ordered): Meds ordered this encounter  Medications  . cyclobenzaprine (FLEXERIL) 10 MG tablet    Sig: Take 1 tablet (10 mg total) by mouth at bedtime as needed for muscle spasms.    Dispense:  30 tablet    Refill:  5    Fill one day early if pharmacy is closed on scheduled refill date. May substitute for generic if available.  Marland Kitchen oxyCODONE (OXY IR/ROXICODONE) 5 MG immediate release tablet    Sig: Take 1-2 tablets (5-10 mg total) by mouth at bedtime as needed for severe pain. Must last 30 days.    Dispense:  60 tablet    Refill:  0    Chronic Pain: STOP Act (Not applicable) Fill 1 day early if closed on refill date. Do not fill until: 05/26/2019. To last until: 06/25/2019. Avoid benzodiazepines within 8 hours of opioids   Orders:  Orders  Placed This Encounter  Procedures  . LESI (PRN)    Standing Status:   Standing    Number of Occurrences:   9    Standing Expiration Date:   09/09/2020    Scheduling Instructions:     Purpose: Therapeutic      Indication: Lower extremity pain/Sciatica left (M54.32).     Side: Left-sided     Level: L3-4     Sedation: Patient's choice.     TIMEFRAME: PRN procedure. (Mr. Swagger will call when needed.)    Order Specific Question:   Where will this procedure be performed?    Answer:   [facility]   Follow-up plan:   No follow-ups on file.      Interventional management options: Procedure(s) under consideration:  Diagnostic left-sided lumbar epidural steroid injection under fluoroscopic guidance and IV sedation.         Recent Visits Date Type Provider Dept  02/26/19 Procedure visit [name], MD [facility]  02/25/19 Telemedicine [name], MD [facility]  Showing recent visits within past 90 days and meeting all other requirements   Today's Visits Date Type Provider Dept  03/12/19 Telemedicine [name], MD [facility]  Showing today's visits and meeting all other requirements   Future Appointments Date Type Provider Dept  05/22/19 Appointment [name], MD [facility]  Showing future appointments within next 90 days and meeting all other requirements   I discussed the assessment and treatment plan with the patient. The patient was provided an opportunity to ask questions and all were answered. The patient agreed with the plan and demonstrated an understanding of the instructions.  Patient advised to call back or seek an in-person evaluation if the symptoms or condition worsens.  Total duration of non-face-to-face encounter: 13 minutes.  Note by: Gaspar Cola, MD Date: 03/12/2019; Time: 9:46 AM

## 2019-03-11 ENCOUNTER — Ambulatory Visit: Payer: Medicare Other

## 2019-03-11 NOTE — Progress Notes (Signed)
Pain relief after procedure (treated area only): (Questions asked to patient) 1. Starting about 15 minutes after the procedure, and "while the area was still numb" (from the local anesthetics), were you having any of your usual pain "in that area" (the treated area)?  (NOTE: NOT including the discomfort from the needle sticks.) First 1 hour: 100 % better. First 4-6 hours: 100 % better. 2. How long did the numbness from the local anesthetics last? (More than 6 hours?) Duration: 24 hours.  3. How much better is your pain now, when compared to before the procedure? Current benefit: 100 % better. 4. Can you move better now? Improvement in ROM (Range of Motion): Yes. 5. Can you do more now? Improvement in function: Yes. 4. Did you have any problems with the procedure? Side-effects/Complications: No.

## 2019-03-12 ENCOUNTER — Other Ambulatory Visit: Payer: Self-pay

## 2019-03-12 ENCOUNTER — Ambulatory Visit: Payer: Medicare Other | Attending: Pain Medicine | Admitting: Pain Medicine

## 2019-03-12 DIAGNOSIS — G894 Chronic pain syndrome: Secondary | ICD-10-CM | POA: Diagnosis not present

## 2019-03-12 DIAGNOSIS — M7918 Myalgia, other site: Secondary | ICD-10-CM | POA: Diagnosis not present

## 2019-03-12 DIAGNOSIS — M79604 Pain in right leg: Secondary | ICD-10-CM

## 2019-03-12 DIAGNOSIS — M5441 Lumbago with sciatica, right side: Secondary | ICD-10-CM

## 2019-03-12 DIAGNOSIS — M5442 Lumbago with sciatica, left side: Secondary | ICD-10-CM

## 2019-03-12 DIAGNOSIS — M79605 Pain in left leg: Secondary | ICD-10-CM

## 2019-03-12 DIAGNOSIS — G8929 Other chronic pain: Secondary | ICD-10-CM

## 2019-03-12 MED ORDER — CYCLOBENZAPRINE HCL 10 MG PO TABS: 10.0000 mg | ORAL_TABLET | Freq: Every evening | ORAL | 5 refills | Status: DC | PRN

## 2019-03-12 MED ORDER — OXYCODONE HCL 5 MG PO TABS: 5.0000 mg | ORAL_TABLET | Freq: Every evening | ORAL | 0 refills | Status: DC | PRN

## 2019-03-14 ENCOUNTER — Other Ambulatory Visit: Payer: Self-pay | Admitting: Family Medicine

## 2019-03-14 DIAGNOSIS — J3089 Other allergic rhinitis: Secondary | ICD-10-CM

## 2019-03-19 ENCOUNTER — Other Ambulatory Visit: Payer: Self-pay

## 2019-03-19 ENCOUNTER — Ambulatory Visit
Admission: RE | Admit: 2019-03-19 | Discharge: 2019-03-19 | Disposition: A | Discharge status: Home / Self Care | Payer: Medicare Other | Source: Ambulatory Visit | Attending: Pain Medicine | Admitting: Pain Medicine

## 2019-03-19 DIAGNOSIS — G8929 Other chronic pain: Secondary | ICD-10-CM | POA: Diagnosis not present

## 2019-03-19 DIAGNOSIS — M1612 Unilateral primary osteoarthritis, left hip: Secondary | ICD-10-CM | POA: Diagnosis not present

## 2019-03-19 DIAGNOSIS — M8949 Other hypertrophic osteoarthropathy, multiple sites: Secondary | ICD-10-CM | POA: Insufficient documentation

## 2019-03-19 DIAGNOSIS — M25552 Pain in left hip: Secondary | ICD-10-CM | POA: Diagnosis not present

## 2019-03-19 DIAGNOSIS — M159 Polyosteoarthritis, unspecified: Secondary | ICD-10-CM

## 2019-03-19 DIAGNOSIS — M150 Primary generalized (osteo)arthritis: Secondary | ICD-10-CM

## 2019-03-22 ENCOUNTER — Telehealth: Payer: Self-pay | Admitting: Family Medicine

## 2019-03-22 NOTE — Telephone Encounter (Signed)
I called the patient to schedule AWV with Tiffany, but there was no answer and no option to leave a message. Last AWV 08/02/17 VDM (DD)

## 2019-03-26 ENCOUNTER — Other Ambulatory Visit: Payer: Self-pay

## 2019-03-26 ENCOUNTER — Ambulatory Visit (INDEPENDENT_AMBULATORY_CARE_PROVIDER_SITE_OTHER): Payer: Medicare Other | Admitting: Gastroenterology

## 2019-03-26 VITALS — BP 111/77 | HR 69 | Temp 98.0°F | Ht 69.0 in | Wt 207.4 lb

## 2019-03-26 DIAGNOSIS — K6289 Other specified diseases of anus and rectum: Secondary | ICD-10-CM | POA: Diagnosis not present

## 2019-03-26 NOTE — Progress Notes (Signed)
Matthew Bellows Matthew Maldonado, MRCP(U.K) [address]  [place]  [place], [place] [address]  Main: [phone]  Fax: [phone]   Gastroenterology Consultation  Referring Provider:     Laneta Simmers Primary Care Physician:  Olin Hauser, DO Primary Gastroenterologist:  Dr. Jonathon Maldonado  Reason for Consultation:     Rectal polyp        HPI:   Matthew Maldonado is a 69 y.o. y/o male referred in 12/2018 for rectal polyp palpated during DRE for BPH evaluation by his urologist.   He denies any change in bowel habits, change in shape of stool, rectal bleeding, weight loss.  Last colonoscopy documented was on January 2013 not sure the colonoscopy was done at that point all the documentation was on that day.  Either waits at least 8 years back that he had a last colonoscopy the patient recalls he was not having told he had any polyps.  Past Medical History:  Diagnosis Date   Anxiety    Arthritis    hand, knees, hip   Blood vessel disorder    Constricted, not able to be operated on in back of brain.    BPH (benign prostatic hyperplasia)    GERD (gastroesophageal reflux disease)    Insomnia    Minimal change disease    no issues for last 4-5 yrs   Nocturia    PSVT (paroxysmal supraventricular tachycardia) ([facility])    TIA (transient ischemic attack)    Sees neurology at [facility] - last MRI 2017 showing stenosed M1    Past Surgical History:  Procedure Laterality Date   EXCISION NASAL MASS Left 03/22/2018   Procedure: EXCISION nasopharyngeal lesion;  Surgeon: Margaretha Sheffield, Matthew Maldonado;  Location: [facility];  Service: ENT;  Laterality: Left;   NASAL ENDOSCOPY Bilateral 03/22/2018   Procedure: NASAL ENDOSCOPY NASOPHARYNGESCOPY;  Surgeon: Margaretha Sheffield, Matthew Maldonado;  Location: [facility];  Service: ENT;  Laterality: Bilateral;   NASAL SEPTOPLASTY W/ TURBINOPLASTY Bilateral 03/22/2018   Procedure: NASAL SEPTOPLASTY WITH INFERIORTURBINATE REDUCTION;  Surgeon: Margaretha Sheffield,  Matthew Maldonado;  Location: [facility];  Service: ENT;  Laterality: Bilateral;   NASOPHARYNGOSCOPY EUSTATION TUBE BALLOON DILATION Bilateral 03/22/2018   Procedure: NASOPHARYNGOSCOPY EUSTATION TUBE BALLOON DILATION;  Surgeon: Margaretha Sheffield, Matthew Maldonado;  Location: [facility];  Service: ENT;  Laterality: Bilateral;   RENAL BIOPSY      Prior to Admission medications   Medication Sig Start Date End Date Taking? Authorizing Provider  atorvastatin (LIPITOR) 80 MG tablet TAKE 1 TABLET BY MOUTH AT BEDTIME 12/25/18   Mikey College, NP  busPIRone (BUSPAR) 15 MG tablet TAKE 1 TABLET BY MOUTH TWICE DAILY 01/21/19   Karamalegos, Devonne Doughty, DO  cyclobenzaprine (FLEXERIL) 10 MG tablet Take 1 tablet (10 mg total) by mouth at bedtime as needed for muscle spasms. 03/12/19 09/08/19  [name], Matthew Maldonado  Diclofenac Sodium 3 % GEL Place 4 g onto the skin 4 (four) times daily. 02/25/19 08/24/19  [name], Matthew Maldonado  esomeprazole (NEXIUM) 20 MG capsule TAKE 1 CAPSULE BY MOUTH ONCE DAILY AT 12NOON. 06/21/18   Johnson, [name], DO  fluticasone (FLONASE) 50 MCG/ACT nasal spray Place 2 sprays into both nostrils daily. 04/24/17   Johnson, [name], DO  hydrOXYzine (VISTARIL) 25 MG capsule Take 1-3 capsules (25-75 mg total) by mouth at bedtime as needed (for severe anxiety sx and sleep). Patient not taking: Reported on 02/21/2019 11/07/18   Olin Hauser, DO  ketoconazole (NIZORAL) 2 % shampoo APPLY  TOPICALLY AS NEEDED IRRITATION TO SCALP 1-2 TIMES A WEEK 03/16/18   Karamalegos, Devonne Doughty, DO  meloxicam (MOBIC) 15 MG tablet Take 1 tablet (15 mg total) by mouth daily. 02/25/19 08/24/19  [name], Matthew Maldonado  metoprolol succinate (TOPROL-XL) 25 MG 24 hr tablet TAKE 1 TABLET BY MOUTH ONCE DAILY 11/21/18   Parks Ranger, Devonne Doughty, DO  montelukast (SINGULAIR) 10 MG tablet TAKE 1 TABLET BY MOUTH AT BEDTIME 03/18/19   Karamalegos, Devonne Doughty, DO  Multiple Vitamins-Minerals (MULTIVITAMIN WITH MINERALS) tablet Take 1  tablet by mouth daily.    Provider, Historical, Matthew Maldonado  oxyCODONE (OXY IR/ROXICODONE) 5 MG immediate release tablet Take 1-2 tablets (5-10 mg total) by mouth at bedtime as needed for severe pain. Must last 30 days. 03/27/19 04/26/19  [name], Matthew Maldonado  oxyCODONE (OXY IR/ROXICODONE) 5 MG immediate release tablet Take 1-2 tablets (5-10 mg total) by mouth at bedtime as needed for severe pain. Must last 30 days. 04/26/19 05/26/19  [name], Matthew Maldonado  oxyCODONE (OXY IR/ROXICODONE) 5 MG immediate release tablet Take 1-2 tablets (5-10 mg total) by mouth at bedtime as needed for severe pain. Must last 30 days. 05/26/19 06/25/19  [name], Matthew Maldonado  pregabalin (LYRICA) 50 MG capsule Take 2 capsules (100 mg total) by mouth at bedtime AND 1 capsule (50 mg total) daily. 02/25/19 08/24/19  [name], Matthew Maldonado  venlafaxine XR (EFFEXOR-XR) 75 MG 24 hr capsule TAKE 1 CAPSULE BY MOUTH ONCE DAILY 02/28/19   Olin Hauser, DO    Family History  Problem Relation Age of Onset   Osteoporosis Mother    Heart disease Father    Heart disease Brother    Alcohol abuse Brother    Kidney disease Neg Hx    Prostate cancer Neg Hx      Social History   Tobacco Use   Smoking status: Former Smoker    Years: 4.00    Types: Cigars   Smokeless tobacco: Current User   Tobacco comment: 1 or 2 a day   Substance Use Topics   Alcohol use: Yes    Alcohol/week: 18.0 standard drinks    Types: 1 Cans of beer, 14 Shots of liquor, 3 Standard drinks or equivalent per week   Drug use: No    Allergies as of 03/26/2019   (No Known Allergies)    Review of Systems:    All systems reviewed and negative except where noted in HPI.   Physical Exam:  There were no vitals taken for this visit. No LMP for male patient. Psych:  Alert and cooperative. Normal mood and affect. General:   Alert,  Well-developed, well-nourished, pleasant and cooperative in NAD Head:  Normocephalic and atraumatic. Eyes:   Sclera clear, no icterus.   Conjunctiva pink. Ears:  Normal auditory acuity. Lungs:  Respirations even and unlabored.  Clear throughout to auscultation.   No wheezes, crackles, or rhonchi. No acute distress. Heart:  Regular rate and rhythm; no murmurs, clicks, rubs, or gallops. Abdomen:  Normal bowel sounds.  No bruits.  Soft, non-tender and non-distended without masses, hepatosplenomegaly or hernias noted.  No guarding or rebound tenderness.    Neurologic:  Alert and oriented x3;  grossly normal neurologically. Skin:  Intact without significant lesions or rashes. No jaundice. Lymph Nodes:  No significant cervical adenopathy. Psych:  Alert and cooperative. Normal mood and affect.  Imaging Studies: MR HIP LEFT WO CONTRAST  Result Date: 03/20/2019 CLINICAL DATA:  Chronic hip pain.  Persistent hip pain. EXAM: MR OF THE LEFT  HIP WITHOUT CONTRAST TECHNIQUE: Multiplanar, multisequence MR imaging was performed. No intravenous contrast was administered. COMPARISON:  None. FINDINGS: Bones No hip fracture, dislocation or avascular necrosis. No periosteal reaction or bone destruction. No aggressive osseous lesion. Normal sacrum and sacroiliac joints. No SI joint widening or erosive changes. Articular cartilage and labrum Articular cartilage: High-grade partial-thickness cartilage loss with areas of full-thickness cartilage loss of the left femoral head and acetabulum. High-grade partial-thickness cartilage loss and areas of full-thickness cartilage loss of the right femoral head and acetabulum. Labrum: Severe left labral degeneration with a degenerative tear and a 16 mm posterior paralabral cyst. Severe right labral degeneration. 3 mm right superior paralabral cyst. Joint or bursal effusion Joint effusion:  Small left hip joint effusion. Bursae:  No bursa formation. Muscles and tendons Flexors: Normal. Extensors: Normal. Abductors: Normal. Adductors: Normal. Gluteals: Normal. Hamstrings: Normal. Other findings  Miscellaneous: No pelvic free fluid. No fluid collection or hematoma. No inguinal lymphadenopathy. No inguinal hernia. IMPRESSION: 1. Severe osteoarthritis of the right hip. 2. Severe osteoarthritis of the left hip. 3. Severe left labral degeneration with a degenerative tear and a 16 mm posterior paralabral cyst. 4. Severe right labral degeneration. 3 mm right superior paralabral cyst. Electronically Signed   By: Kathreen Devoid   On: 03/20/2019 08:45   Fluoro (C-Arm) (<60 min) (No Report)  Result Date: 02/26/2019 Fluoro was used, but no Radiologist interpretation will be provided. Please refer to "NOTES" tab for provider progress note.   Assessment and Plan:   Matthew Maldonado is a 69 y.o. y/o male has been referred for possibly rectal polyp felt on rectal examination.  Last colonoscopy at least 8 years if not longer.  Discussed plan to proceed with colonoscopy.  I have discussed alternative options, risks & benefits,  which include, but are not limited to, bleeding, infection, perforation,respiratory complication & drug reaction.  The patient agrees with this plan & written consent will be obtained.     Follow up in PRN  Dr Matthew Bellows Matthew Maldonado,MRCP(U.K)

## 2019-04-01 ENCOUNTER — Other Ambulatory Visit: Payer: Self-pay | Admitting: Nurse Practitioner

## 2019-04-01 DIAGNOSIS — E782 Mixed hyperlipidemia: Secondary | ICD-10-CM

## 2019-04-02 ENCOUNTER — Other Ambulatory Visit
Admission: RE | Admit: 2019-04-02 | Discharge: 2019-04-02 | Disposition: A | Discharge status: Home / Self Care | Payer: Medicare Other | Source: Ambulatory Visit | Attending: Gastroenterology | Admitting: Gastroenterology

## 2019-04-02 DIAGNOSIS — Z01812 Encounter for preprocedural laboratory examination: Secondary | ICD-10-CM | POA: Insufficient documentation

## 2019-04-02 DIAGNOSIS — Z20822 Contact with and (suspected) exposure to covid-19: Secondary | ICD-10-CM | POA: Insufficient documentation

## 2019-04-03 LAB — SARS CORONAVIRUS 2 (TAT 6-24 HRS): SARS Coronavirus 2: NEGATIVE

## 2019-04-04 ENCOUNTER — Ambulatory Visit: Payer: Medicare Other | Admitting: Anesthesiology

## 2019-04-04 ENCOUNTER — Ambulatory Visit
Admission: RE | Admit: 2019-04-04 | Discharge: 2019-04-04 | Disposition: A | Discharge status: Home / Self Care | Payer: Medicare Other | Attending: Gastroenterology | Admitting: Gastroenterology

## 2019-04-04 ENCOUNTER — Encounter
Admission: RE | Disposition: A | Discharge status: Home / Self Care | Payer: Self-pay | Source: Home / Self Care | Attending: Gastroenterology

## 2019-04-04 ENCOUNTER — Encounter: Payer: Self-pay | Admitting: Gastroenterology

## 2019-04-04 ENCOUNTER — Other Ambulatory Visit: Payer: Self-pay

## 2019-04-04 DIAGNOSIS — K648 Other hemorrhoids: Secondary | ICD-10-CM | POA: Diagnosis not present

## 2019-04-04 DIAGNOSIS — Z791 Long term (current) use of non-steroidal anti-inflammatories (NSAID): Secondary | ICD-10-CM | POA: Insufficient documentation

## 2019-04-04 DIAGNOSIS — K635 Polyp of colon: Secondary | ICD-10-CM | POA: Diagnosis not present

## 2019-04-04 DIAGNOSIS — N400 Enlarged prostate without lower urinary tract symptoms: Secondary | ICD-10-CM | POA: Diagnosis not present

## 2019-04-04 DIAGNOSIS — D123 Benign neoplasm of transverse colon: Secondary | ICD-10-CM | POA: Insufficient documentation

## 2019-04-04 DIAGNOSIS — E785 Hyperlipidemia, unspecified: Secondary | ICD-10-CM | POA: Diagnosis not present

## 2019-04-04 DIAGNOSIS — M170 Bilateral primary osteoarthritis of knee: Secondary | ICD-10-CM | POA: Diagnosis not present

## 2019-04-04 DIAGNOSIS — M19041 Primary osteoarthritis, right hand: Secondary | ICD-10-CM | POA: Insufficient documentation

## 2019-04-04 DIAGNOSIS — Z87891 Personal history of nicotine dependence: Secondary | ICD-10-CM | POA: Insufficient documentation

## 2019-04-04 DIAGNOSIS — Z8673 Personal history of transient ischemic attack (TIA), and cerebral infarction without residual deficits: Secondary | ICD-10-CM | POA: Insufficient documentation

## 2019-04-04 DIAGNOSIS — K219 Gastro-esophageal reflux disease without esophagitis: Secondary | ICD-10-CM | POA: Diagnosis not present

## 2019-04-04 DIAGNOSIS — K6289 Other specified diseases of anus and rectum: Secondary | ICD-10-CM | POA: Diagnosis not present

## 2019-04-04 DIAGNOSIS — I471 Supraventricular tachycardia: Secondary | ICD-10-CM | POA: Diagnosis not present

## 2019-04-04 DIAGNOSIS — Z79899 Other long term (current) drug therapy: Secondary | ICD-10-CM | POA: Insufficient documentation

## 2019-04-04 DIAGNOSIS — M19042 Primary osteoarthritis, left hand: Secondary | ICD-10-CM | POA: Diagnosis not present

## 2019-04-04 DIAGNOSIS — G4700 Insomnia, unspecified: Secondary | ICD-10-CM | POA: Diagnosis not present

## 2019-04-04 DIAGNOSIS — M1610 Unilateral primary osteoarthritis, unspecified hip: Secondary | ICD-10-CM | POA: Insufficient documentation

## 2019-04-04 DIAGNOSIS — R6889 Other general symptoms and signs: Secondary | ICD-10-CM | POA: Diagnosis not present

## 2019-04-04 DIAGNOSIS — F419 Anxiety disorder, unspecified: Secondary | ICD-10-CM | POA: Diagnosis not present

## 2019-04-04 DIAGNOSIS — F418 Other specified anxiety disorders: Secondary | ICD-10-CM | POA: Diagnosis not present

## 2019-04-04 HISTORY — PX: COLONOSCOPY WITH PROPOFOL: SHX5780

## 2019-04-04 SURGERY — COLONOSCOPY WITH PROPOFOL
Anesthesia: General

## 2019-04-04 MED ORDER — LACTATED RINGERS IV SOLN: INTRAVENOUS | Status: DC | PRN

## 2019-04-04 MED ORDER — PROPOFOL 500 MG/50ML IV EMUL
INTRAVENOUS | Status: DC | PRN
  Administered 2019-04-04: 150 ug/kg/min via INTRAVENOUS
  Administered 2019-04-04: 30 ug via INTRAVENOUS

## 2019-04-04 MED ORDER — SODIUM CHLORIDE 0.9 % IV SOLN: INTRAVENOUS | Status: DC

## 2019-04-04 NOTE — Anesthesia Preprocedure Evaluation (Signed)
Anesthesia Evaluation  Patient identified by MRN, date of birth, ID band Patient awake    Reviewed: Allergy & Precautions, NPO status , Patient's Chart, lab work & pertinent test results  History of Anesthesia Complications Negative for: history of anesthetic complications  Airway Mallampati: II  TM Distance: >3 FB    Comment: [place]  (+) Dental Advidsory Given, Teeth Intact   Pulmonary neg shortness of breath, neg COPD, neg recent URI, Current Smoker and Patient abstained from smoking., former smoker,    breath sounds clear to auscultation       Cardiovascular (-) hypertension(-) angina(-) Past MI and (-) Cardiac Stents + dysrhythmias (PSVT - managed with metoprolol) (-) Valvular Problems/Murmurs Rhythm:Regular Rate:Normal  Normal TTE and stress test 2017  Takes atorvastatin for stroke prevention   Neuro/Psych neg Seizures PSYCHIATRIC DISORDERS Anxiety Depression Brain MRI 2017: No acute intracranial abnormality. 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 Vessel wall imaging may be considered if there is clinical suspicion for a vasculitis.  Stenosed vessel above is not operable and stroke prevention has been managed with atorvastatin and aspirin TIA   GI/Hepatic Neg liver ROS, GERD  Medicated,  Endo/Other  negative endocrine ROSBMI 30  Renal/GU Renal disease (minimal change disease)     Musculoskeletal  (+) Arthritis ,   Abdominal   Peds  Hematology   Anesthesia Other Findings Past Medical History: No date: Anxiety No date: Arthritis     Comment:  hand, knees, hip No date: Blood vessel disorder     Comment:  Constricted, not able to be operated on in back of               brain.  No date: BPH (benign prostatic hyperplasia) No date: GERD (gastroesophageal reflux disease) No date: Insomnia No date: Minimal change disease     Comment:  no issues for last 4-5 yrs No date: Nocturia No date: PSVT  (paroxysmal supraventricular tachycardia) (HCC) No date: TIA (transient ischemic attack)     Comment:  Sees neurology at [facility] - last MRI 2017 showing stenosed               M1   Reproductive/Obstetrics                             Anesthesia Physical  Anesthesia Plan  ASA: III  Anesthesia Plan: General   Post-op Pain Management:    Induction: Intravenous  PONV Risk Score and Plan: 1 and Propofol infusion and TIVA  Airway Management Planned: Natural Airway and Nasal Cannula  Additional Equipment:   Intra-op Plan:   Post-operative Plan:   Informed Consent: I have reviewed the patients History and Physical, chart, labs and discussed the procedure including the risks, benefits and alternatives for the proposed anesthesia with the patient or authorized representative who has indicated his/her understanding and acceptance.     Dental advisory given  Plan Discussed with: CRNA  Anesthesia Plan Comments:         Anesthesia Quick Evaluation

## 2019-04-04 NOTE — H&P (Addendum)
Matthew Bellows, MD [address] [place], [place], [place], [address] [address], [place], [place], [place], [address] Phone: [phone]  Fax: [phone]  Primary Care Physician:  Olin Hauser, DO   Pre-Procedure History & Physical: HPI:  Matthew Maldonado is a 69 y.o. male is here for an colonoscopy.   Past Medical History:  Diagnosis Date  . Anxiety   . Arthritis    hand, knees, hip  . 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   Marland Kitchen BPH (benign prostatic hyperplasia)   . GERD (gastroesophageal reflux disease)   . Insomnia   . Minimal change disease    no issues for last 4-5 yrs  . Nocturia   . PSVT (paroxysmal supraventricular tachycardia) ([place])   . TIA (transient ischemic attack)    Sees neurology at [facility] - last MRI 2017 showing stenosed M1    Past Surgical History:  Procedure Laterality Date  . EXCISION NASAL MASS Left 03/22/2018   Procedure: EXCISION nasopharyngeal lesion;  Surgeon: Margaretha Sheffield, MD;  Location: [facility];  Service: ENT;  Laterality: Left;  . NASAL ENDOSCOPY Bilateral 03/22/2018   Procedure: NASAL ENDOSCOPY NASOPHARYNGESCOPY;  Surgeon: Margaretha Sheffield, MD;  Location: [facility];  Service: ENT;  Laterality: Bilateral;  . NASAL SEPTOPLASTY W/ TURBINOPLASTY Bilateral 03/22/2018   Procedure: NASAL SEPTOPLASTY WITH INFERIORTURBINATE REDUCTION;  Surgeon: Margaretha Sheffield, MD;  Location: [facility];  Service: ENT;  Laterality: Bilateral;  . NASOPHARYNGOSCOPY EUSTATION TUBE BALLOON DILATION Bilateral 03/22/2018   Procedure: NASOPHARYNGOSCOPY EUSTATION TUBE BALLOON DILATION;  Surgeon: Margaretha Sheffield, MD;  Location: [facility];  Service: ENT;  Laterality: Bilateral;  . RENAL BIOPSY      Prior to Admission medications   Medication Sig Start Date End Date Taking? Authorizing Provider  atorvastatin (LIPITOR) 80 MG tablet TAKE 1 TABLET BY MOUTH AT BEDTIME 04/02/19  Yes Karamalegos,  Devonne Doughty, DO  busPIRone (BUSPAR) 15 MG tablet TAKE 1 TABLET BY MOUTH TWICE DAILY 01/21/19  Yes Karamalegos, Devonne Doughty, DO  cyclobenzaprine (FLEXERIL) 10 MG tablet Take 1 tablet (10 mg total) by mouth at bedtime as needed for muscle spasms. 03/12/19 09/08/19 Yes [name], MD  esomeprazole (NEXIUM) 20 MG capsule TAKE 1 CAPSULE BY MOUTH ONCE DAILY AT 12NOON. 06/21/18  Yes Johnson, [name], DO  meloxicam (MOBIC) 15 MG tablet Take 1 tablet (15 mg total) by mouth daily. 02/25/19 08/24/19 Yes [name], MD  metoprolol succinate (TOPROL-XL) 25 MG 24 hr tablet TAKE 1 TABLET BY MOUTH ONCE DAILY 11/21/18  Yes Karamalegos, Devonne Doughty, DO  Multiple Vitamins-Minerals (MULTIVITAMIN WITH MINERALS) tablet Take 1 tablet by mouth daily.   Yes [provider]  oxyCODONE (OXY IR/ROXICODONE) 5 MG immediate release tablet Take 1-2 tablets (5-10 mg total) by mouth at bedtime as needed for severe pain. Must last 30 days. 03/27/19 04/26/19 Yes [name], MD  oxyCODONE (OXY IR/ROXICODONE) 5 MG immediate release tablet Take 1-2 tablets (5-10 mg total) by mouth at bedtime as needed for severe pain. Must last 30 days. 04/26/19 05/26/19 Yes [name], MD  oxyCODONE (OXY IR/ROXICODONE) 5 MG immediate release tablet Take 1-2 tablets (5-10 mg total) by mouth at bedtime as needed for severe pain. Must last 30 days. 05/26/19 06/25/19 Yes [name], MD  pregabalin (LYRICA) 50 MG capsule Take 2 capsules (100 mg total) by mouth at bedtime AND 1 capsule (50 mg total) daily. 02/25/19 08/24/19 Yes  [name], MD  venlafaxine XR (EFFEXOR-XR) 75 MG 24 hr capsule TAKE 1 CAPSULE BY MOUTH ONCE DAILY 02/28/19  Yes Karamalegos, Devonne Doughty, DO  Diclofenac Sodium 3 % GEL Place 4 g onto the skin 4 (four) times daily. 02/25/19 08/24/19  [name], MD  fluticasone (FLONASE) 50 MCG/ACT nasal spray Place 2 sprays into both nostrils daily. 04/24/17   Johnson, [name], DO  hydrOXYzine (VISTARIL)  25 MG capsule Take 1-3 capsules (25-75 mg total) by mouth at bedtime as needed (for severe anxiety sx and sleep). Patient not taking: Reported on 02/21/2019 11/07/18   Olin Hauser, DO  ketoconazole (NIZORAL) 2 % shampoo APPLY TOPICALLY AS NEEDED IRRITATION TO SCALP 1-2 TIMES A WEEK 03/16/18   Karamalegos, Devonne Doughty, DO  montelukast (SINGULAIR) 10 MG tablet TAKE 1 TABLET BY MOUTH AT BEDTIME Patient not taking: Reported on 03/26/2019 03/18/19   Olin Hauser, DO    Allergies as of 03/26/2019  . (No Known Allergies)    Family History  Problem Relation Age of Onset  . Osteoporosis Mother   . Heart disease Father   . Heart disease Brother   . Alcohol abuse Brother   . Kidney disease Neg Hx   . Prostate cancer Neg Hx     Social History   Socioeconomic History  . Marital status: Married    Spouse name: quein  . Number of children: 0  . Years of education: Not on file  . Highest education level: Some college, no degree  Occupational History    Comment: retired  Tobacco Use  . Smoking status: Former Smoker    Years: 4.00    Types: Cigars  . Smokeless tobacco: Former Systems developer  . Tobacco comment: 1 or 2 a day   Substance and Sexual Activity  . Alcohol use: Yes    Alcohol/week: 18.0 standard drinks    Types: 1 Cans of beer, 14 Shots of liquor, 3 Standard drinks or equivalent per week  . Drug use: No  . Sexual activity: Not Currently  Other Topics Concern  . Not on file  Social History Narrative  . Not on file   Social Determinants of Health   Financial Resource Strain:   . Difficulty of Paying Living Expenses: Not on file  Food Insecurity:   . Worried About Charity fundraiser in the Last Year: Not on file  . Ran Out of Food in the Last Year: Not on file  Transportation Needs:   . Lack of Transportation (Medical): Not on file  . Lack of Transportation (Non-Medical): Not on file  Physical Activity:   . Days of Exercise per Week: Not on file  . Minutes of  Exercise per Session: Not on file  Stress:   . Feeling of Stress : Not on file  Social Connections:   . Frequency of Communication with Friends and Family: Not on file  . Frequency of Social Gatherings with Friends and Family: Not on file  . Attends Religious Services: Not on file  . Active Member of Clubs or Organizations: Not on file  . Attends Archivist Meetings: Not on file  . Marital Status: Not on file  Intimate Partner Violence:   . Fear of Current or Ex-Partner: Not on file  . Emotionally Abused: Not on file  . Physically Abused: Not on file  . Sexually Abused: Not on file    Review of Systems: See HPI, otherwise negative ROS  Physical Exam: BP 115/86  Pulse 71   Temp (!) 97.3 F (36.3 C) (Temporal)   Resp 15   Ht 5\' 9"  (1.753 m)   Wt 90.7 kg   SpO2 99%   BMI 29.53 kg/m  General:   Alert,  pleasant and cooperative in NAD Head:  Normocephalic and atraumatic. Neck:  Supple; no masses or thyromegaly. Lungs:  Clear throughout to auscultation, normal respiratory effort.    Heart:  +S1, +S2, Regular rate and rhythm, No edema. Abdomen:  Soft, nontender and nondistended. Normal bowel sounds, without guarding, and without rebound.   Neurologic:  Alert and  oriented x4;  grossly normal neurologically.  Impression/Plan: GAGANDEEP SIEGERT is here for an colonoscopy to be performed for abnormal rectal exam. Risks, benefits, limitations, and alternatives regarding  colonoscopy have been reviewed with the patient.  Questions have been answered.  All parties agreeable.   Matthew Bellows, MD  04/04/2019, 8:18 AM

## 2019-04-04 NOTE — Anesthesia Postprocedure Evaluation (Signed)
Anesthesia Post Note  Patient: Matthew Maldonado  Procedure(s) Performed: COLONOSCOPY WITH PROPOFOL (N/A )  Patient location during evaluation: Endoscopy Anesthesia Type: General Level of consciousness: awake and alert Pain management: pain level controlled Vital Signs Assessment: post-procedure vital signs reviewed and stable Respiratory status: spontaneous breathing, nonlabored ventilation, respiratory function stable and patient connected to nasal cannula oxygen Cardiovascular status: blood pressure returned to baseline and stable Postop Assessment: no apparent nausea or vomiting Anesthetic complications: no     Last Vitals:  Vitals:   04/04/19 0759 04/04/19 0856  BP: 115/86 104/72  Pulse: 71   Resp: 15   Temp: (!) 36.3 C (!) 35.9 C  SpO2: 99%     Last Pain:  Vitals:   04/04/19 0916  TempSrc:   PainSc: 0-No pain                 Martha Clan

## 2019-04-04 NOTE — Anesthesia Procedure Notes (Signed)
Date/Time: 04/04/2019 9:38 AM Performed by: Nelda Marseille, CRNA Pre-anesthesia Checklist: Patient identified, Emergency Drugs available, Suction available, Patient being monitored and Timeout performed Oxygen Delivery Method: Nasal cannula

## 2019-04-04 NOTE — Op Note (Signed)
[facility] Gastroenterology Patient Name: Matthew Maldonado Procedure Date: 04/04/2019 8:21 AM MRN: [id number] Account #: [id number] Date of Birth: [birth year] Admit Type: Outpatient Age: 69 Room: [facility] ENDO ROOM 1 Gender: Male Note Status: Finalized Procedure:             Colonoscopy Indications:           Abnormal rectal exam Providers:             Jonathon Bellows MD, MD Referring MD:          Olin Hauser (Referring MD) Medicines:             Monitored Anesthesia Care Complications:         No immediate complications. Procedure:             Pre-Anesthesia Assessment:                        - Prior to the procedure, a History and Physical was                         performed, and patient medications, allergies and                         sensitivities were reviewed. The patient's tolerance                         of previous anesthesia was reviewed.                        - The risks and benefits of the procedure and the                         sedation options and risks were discussed with the                         patient. All questions were answered and informed                         consent was obtained.                        - ASA Grade Assessment: II - A patient with mild                         systemic disease.                        After obtaining informed consent, the colonoscope was                         passed under direct vision. Throughout the procedure,                         the patient's blood pressure, pulse, and oxygen                         saturations were monitored continuously. The                         Colonoscope was introduced through the anus and  advanced to the the cecum, identified by the                         appendiceal orifice. The colonoscopy was performed                         with ease. The patient tolerated the procedure well.                         The quality of the bowel preparation  was good. Findings:      The perianal and digital rectal examinations were normal.      Non-bleeding internal hemorrhoids were found during retroflexion. The       hemorrhoids were medium-sized and Grade I (internal hemorrhoids that do       not prolapse).      Three sessile polyps were found in the transverse colon. The polyps were       3 to 5 mm in size. These polyps were removed with a cold snare.       Resection and retrieval were complete.      The exam was otherwise without abnormality on direct and retroflexion       views. Impression:            - Non-bleeding internal hemorrhoids.                        - Three 3 to 5 mm polyps in the transverse colon,                         removed with a cold snare. Resected and retrieved.                        - The examination was otherwise normal on direct and                         retroflexion views. Recommendation:        - Discharge patient to home (with escort).                        - Resume previous diet.                        - Continue present medications.                        - Await pathology results.                        - Repeat colonoscopy for surveillance based on                         pathology results. Procedure Code(s):     --- Professional ---                        [phone], Colonoscopy, flexible; with removal of                         tumor(s), polyp(s), or other lesion(s) by snare  technique Diagnosis Code(s):     --- Professional ---                        K63.5, Polyp of colon                        K64.0, First degree hemorrhoids                        K62.89, Other specified diseases of anus and rectum CPT copyright 2019 [employer] Medical Association. All rights reserved. The codes documented in this report are preliminary and upon coder review may  be revised to meet current compliance requirements. Jonathon Bellows, MD Jonathon Bellows MD, MD 04/04/2019 8:50:17 AM This report has been  signed electronically. Number of Addenda: 0 Note Initiated On: 04/04/2019 8:21 AM Scope Withdrawal Time: 0 hours 15 minutes 44 seconds  Total Procedure Duration: 0 hours 19 minutes 47 seconds  Estimated Blood Loss:  Estimated blood loss: none.      [facility]

## 2019-04-04 NOTE — Transfer of Care (Signed)
Immediate Anesthesia Transfer of Care Note  Patient: ISREAL MOLINE  Procedure(s) Performed: COLONOSCOPY WITH PROPOFOL (N/A )  Patient Location: PACU and Endoscopy Unit  Anesthesia Type:MAC  Level of Consciousness: awake, alert  and oriented  Airway & Oxygen Therapy: Patient Spontanous Breathing and Patient connected to nasal cannula oxygen  Post-op Assessment: Report given to RN and Post -op Vital signs reviewed and stable  Post vital signs: Reviewed and stable  Last Vitals:  Vitals Value Taken Time  BP 104/72 04/04/19 0856  Temp    Pulse 65 04/04/19 0856  Resp 19 04/04/19 0856  SpO2 98 % 04/04/19 0856  Vitals shown include unvalidated device data.  Last Pain:  Vitals:   04/04/19 0759  TempSrc: Temporal  PainSc: 0-No pain         Complications: No apparent anesthesia complications

## 2019-04-08 ENCOUNTER — Encounter: Payer: Self-pay | Admitting: Gastroenterology

## 2019-04-08 LAB — SURGICAL PATHOLOGY

## 2019-04-15 ENCOUNTER — Ambulatory Visit: Payer: Medicare Other | Admitting: Gastroenterology

## 2019-04-18 DIAGNOSIS — L578 Other skin changes due to chronic exposure to nonionizing radiation: Secondary | ICD-10-CM | POA: Diagnosis not present

## 2019-04-18 DIAGNOSIS — L920 Granuloma annulare: Secondary | ICD-10-CM | POA: Diagnosis not present

## 2019-04-18 DIAGNOSIS — Z872 Personal history of diseases of the skin and subcutaneous tissue: Secondary | ICD-10-CM | POA: Diagnosis not present

## 2019-04-18 DIAGNOSIS — Z859 Personal history of malignant neoplasm, unspecified: Secondary | ICD-10-CM | POA: Diagnosis not present

## 2019-04-18 DIAGNOSIS — L570 Actinic keratosis: Secondary | ICD-10-CM | POA: Diagnosis not present

## 2019-04-29 ENCOUNTER — Telehealth: Payer: Self-pay | Admitting: Family Medicine

## 2019-04-29 NOTE — Telephone Encounter (Signed)
I left a message asking the pt to schedule AWV w/ Tiffany.

## 2019-05-21 ENCOUNTER — Encounter: Payer: Self-pay | Admitting: Pain Medicine

## 2019-05-21 NOTE — Progress Notes (Signed)
Patient: Matthew Maldonado  Service Category: E/M  Provider: Gaspar Cola, MD  DOB: 11-May-1950  DOS: 05/22/2019  Location: Office  MRN: [id number]  Setting: Ambulatory outpatient  Referring Provider: Nobie Putnam *  Type: Established Patient  Specialty: Interventional Pain Management  PCP: Olin Hauser, DO  Location: Remote location  Delivery: TeleHealth     Virtual Encounter - Pain Management PROVIDER NOTE: Information contained herein reflects review and annotations entered in association with encounter. Interpretation of such information and data should be left to medically-trained personnel. Information provided to patient can be located elsewhere in the medical record under "Patient Instructions". Document created using STT-dictation technology, any transcriptional errors that may result from process are unintentional.    Contact & Pharmacy Preferred: [phone] Home: [phone] (home) Mobile: [phone] (mobile) E-mail: [email or web address]  [place], [place]. [place] [address] Phone: [phone] Fax: [phone]   Pre-screening  Matthew Maldonado offered "in-person" vs "virtual" encounter. He indicated preferring virtual for this encounter.   Reason COVID-19*  Social distancing based on CDC and AMA recommendations.   I contacted Matthew Maldonado on 05/22/2019 via telephone.      I clearly identified myself as Gaspar Cola, MD. I verified that I was speaking with the correct person using two identifiers (Name: Matthew Maldonado, and date of birth: 07-May-1950).  Consent I sought verbal advanced consent from Matthew Maldonado for virtual visit interactions. I informed Matthew Maldonado of possible security and privacy concerns, risks, and limitations associated with providing "not-in-person" medical evaluation and management services. I also informed Matthew Maldonado of the availability of "in-person" appointments. Finally, I informed him  that there would be a charge for the virtual visit and that he could be  personally, fully or partially, financially responsible for it. Matthew Maldonado expressed understanding and agreed to proceed.   Historic Elements   Matthew Maldonado is a 69 y.o. year old, male patient evaluated today after his last contact with our practice on 02/27/2019. Matthew Maldonado  has a past medical history of Anxiety, Arthritis, Blood vessel disorder, BPH (benign prostatic hyperplasia), GERD (gastroesophageal reflux disease), Insomnia, Minimal change disease, Nocturia, PSVT (paroxysmal supraventricular tachycardia) ([place]), and TIA (transient ischemic attack). He also  has a past surgical history that includes Renal biopsy; Nasal septoplasty w/ turbinoplasty (Bilateral, 03/22/2018); Nasopharyngoscopy eustation tube balloon dilation (Bilateral, 03/22/2018); Nasal endoscopy (Bilateral, 03/22/2018); Excision nasal mass (Left, 03/22/2018); and Colonoscopy with propofol (N/A, 04/04/2019). Matthew Maldonado has a current medication list which includes the following prescription(s): atorvastatin, buspirone, cyclobenzaprine, diclofenac sodium, esomeprazole, fluticasone, ketoconazole, meloxicam, metoprolol succinate, multivitamin with minerals, [START ON 05/26/2019] oxycodone, [START ON 06/25/2019] oxycodone, [START ON 07/25/2019] oxycodone, pregabalin, and venlafaxine xr. He  reports that he has quit smoking. His smoking use included cigars. He quit after 4.00 years of use. He has quit using smokeless tobacco. He reports current alcohol use of about 18.0 standard drinks of alcohol per week. He reports that he does not use drugs. Matthew Maldonado has No Known Allergies.   HPI  Today, he is being contacted for medication management. The patient indicates doing well with the current medication regimen. No adverse reactions or side effects reported to the medications.  Today we went over the results of his hip MRI where it shows that he has severe bilateral osteoarthritis of the hip  joints with some cysts and labral problems.  We will go ahead and do a referral to orthopedics.  He has indicated that he would like to see the [facility] orthopedic physicians.  In addition to this, he wanted to know if he can take the pregabalin 50 mg, 3 tablets at bedtime.  He refers that taking 2 tablets at a time does help, but he is not having any problems and he thinks that it would probably help more if he goes to 150 mg p.o. at bedtime.  We will go ahead and try that first and if he does well with it, eventually we can switch him to the 150 mg pills so that he only has to take 1 at a time.  In addition to the above, the patient was concerned about his "sciatica".  However, when I asked him about the pain pattern, he indicated that he is having left-sided buttocks pain that runs down the back of the leg and occasionally will refer pain to the top of the left knee.  He refers that when he applies the Voltaren gel to those 2 places, the pain gets better.  This would suggest that the pain is more superficial.  However, pain in that area could be secondary to his bilateral hip arthropathy as well as the lumbar facet arthropathy that we were able to observe on his lumbar MRI.  (Please see scanned MRI).  He also indicated that recently he went to play golf with some friends and at the end of the day his hips were hurting significantly and he could not get this pain under control with the Voltaren gel or any other medication that he was taking.  Because of this, today I have suggested that he start using the Preemptive Analgesia technique (preventive pain technique).  I have provided him with some information regarding this on the patient instruction section of the chart.  To help with this pain I have scheduled the patient to return to the clinic as soon as possible for a bilateral intra-articular hip joint injection under fluoroscopic guidance and IV sedation.  He also has the alternative to have the procedure  done without any sedation as it will all depend on their not there are any issues with anxiety.  Pharmacotherapy Assessment  Analgesic: Oxycodone IR 5 mg, 1-2 tabs PO HS (10 mg/day of oxycodone) MME/day: 15 mg/day.   Monitoring: [place] PMP: PDMP reviewed during this encounter.       Pharmacotherapy: No side-effects or adverse reactions reported. Compliance: No problems identified. Effectiveness: Clinically acceptable. Plan: Refer to "POC".  UDS: No results found for: SUMMARY Laboratory Chemistry Profile   Renal Lab Results  Component Value Date   BUN 25 12/26/2018   CREATININE 0.85 12/26/2018   BCR 29 (H) 12/26/2018   GFRAA 103 12/26/2018   GFRNONAA 90 12/26/2018    Hepatic Lab Results  Component Value Date   AST 32 12/26/2018   ALT 40 09/06/2017   ALBUMIN 4.6 12/26/2018   ALKPHOS 82 12/26/2018    Electrolytes Lab Results  Component Value Date   NA 142 12/26/2018   K 4.4 12/26/2018   CL 107 (H) 12/26/2018   CALCIUM 9.4 12/26/2018   MG 2.1 12/26/2018    Bone Lab Results  Component Value Date   25OHVITD1 43 12/26/2018   25OHVITD2 <1.0 12/26/2018   25OHVITD3 43 12/26/2018    Inflammation (CRP: Acute Phase) (ESR: Chronic Phase) Lab Results  Component Value Date   CRP 1 12/26/2018   ESRSEDRATE 16 12/26/2018      Note: Above Lab results reviewed.  Imaging  MR  HIP LEFT WO CONTRAST CLINICAL DATA:  Chronic hip pain.  Persistent hip pain.  EXAM: MR OF THE LEFT HIP WITHOUT CONTRAST  TECHNIQUE: Multiplanar, multisequence MR imaging was performed. No intravenous contrast was administered.  COMPARISON:  None.  FINDINGS: Bones  No hip fracture, dislocation or avascular necrosis. No periosteal reaction or bone destruction. No aggressive osseous lesion.  Normal sacrum and sacroiliac joints. No SI joint widening or erosive changes.  Articular cartilage and labrum  Articular cartilage: High-grade partial-thickness cartilage loss with areas of full-thickness  cartilage loss of the left femoral head and acetabulum. High-grade partial-thickness cartilage loss and areas of full-thickness cartilage loss of the right femoral head and acetabulum.  Labrum: Severe left labral degeneration with a degenerative tear and a 16 mm posterior paralabral cyst. Severe right labral degeneration. 3 mm right superior paralabral cyst.  Joint or bursal effusion  Joint effusion:  Small left hip joint effusion.  Bursae:  No bursa formation.  Muscles and tendons  Flexors: Normal.  Extensors: Normal.  Abductors: Normal.  Adductors: Normal.  Gluteals: Normal.  Hamstrings: Normal.  Other findings  Miscellaneous: No pelvic free fluid. No fluid collection or hematoma. No inguinal lymphadenopathy. No inguinal hernia.  IMPRESSION: 1. Severe osteoarthritis of the right hip. 2. Severe osteoarthritis of the left hip. 3. Severe left labral degeneration with a degenerative tear and a 16 mm posterior paralabral cyst. 4. Severe right labral degeneration. 3 mm right superior paralabral cyst.  Electronically Signed   By: Kathreen Devoid   On: 03/20/2019 08:45  Assessment  The primary encounter diagnosis was Chronic pain syndrome. Diagnoses of Chronic pain of lower extremity (Primary area of Pain) (Bilateral) (L>R), Chronic buttock pain (Secondary Area of Pain) (Bilateral) (L>R), Chronic hip pain (Tertiary Area of Pain) (Bilateral) (L>R), Chronic thigh pain (Fourth Area of Pain) (Left), Chronic knee pain (Fifth Area of Pain) (Left), Neurogenic pain, Lumbar facet arthropathy, Lumbar facet syndrome (Bilateral), Osteoarthritis of hips (Bilateral), and Enthesopathy of hip region on both sides were also pertinent to this visit.  Plan of Care  Problem-specific:  No problem-specific Assessment & Plan notes found for this encounter.  Matthew Maldonado has a current medication list which includes the following long-term medication(s): atorvastatin, cyclobenzaprine,  diclofenac sodium, esomeprazole, fluticasone, meloxicam, metoprolol succinate, [START ON 05/26/2019] oxycodone, [START ON 06/25/2019] oxycodone, [START ON 07/25/2019] oxycodone, pregabalin, and venlafaxine xr.  Pharmacotherapy (Medications Ordered): Meds ordered this encounter  Medications  . oxyCODONE (OXY IR/ROXICODONE) 5 MG immediate release tablet    Sig: Take 1-2 tablets (5-10 mg total) by mouth at bedtime as needed for severe pain. Must last 30 days.    Dispense:  60 tablet    Refill:  0    Chronic Pain: STOP Act (Not applicable) Fill 1 day early if closed on refill date. Do not fill until: 06/25/2019. To last until: 07/25/2019. Avoid benzodiazepines within 8 hours of opioids  . oxyCODONE (OXY IR/ROXICODONE) 5 MG immediate release tablet    Sig: Take 1-2 tablets (5-10 mg total) by mouth at bedtime as needed for severe pain. Must last 30 days.    Dispense:  60 tablet    Refill:  0    Chronic Pain: STOP Act (Not applicable) Fill 1 day early if closed on refill date. Do not fill until: 07/25/2019. To last until: 08/24/2019. Avoid benzodiazepines within 8 hours of opioids  . pregabalin (LYRICA) 50 MG capsule    Sig: Take 3 capsules (150 mg total) by mouth at bedtime.  Dispense:  90 capsule    Refill:  5    Fill one day early if pharmacy is closed on scheduled refill date. May substitute for generic if available.   Orders:  Orders Placed This Encounter  Procedures  . HIP INJECTION    Standing Status:   Future    Standing Expiration Date:   06/21/2019    Scheduling Instructions:     Side: Bilateral     Sedation: With Sedation.     Timeframe: As soon as schedule allows  . Ambulatory referral to Orthopedic Surgery    Referral Priority:   Routine    Referral Type:   Surgical    Referral Reason:   Specialty Services Required    Requested Specialty:   Orthopedic Surgery    Number of Visits Requested:   1   Follow-up plan:   Return in about 13 weeks (around 08/21/2019) for (VV), (MM), in  addition, Procedure (w/ sedation): (B) IA Hip inj. , (ASAP).      Interventional management options: Procedure(s) under consideration:  Diagnostic left-sided lumbar epidural steroid injection under fluoroscopic guidance and IV sedation.    Recent Visits Date Type Provider Dept  03/12/19 Telemedicine [name], MD [facility]  02/26/19 Procedure visit [name], MD [facility]  02/25/19 Telemedicine [name], MD [facility]  Showing recent visits within past 90 days and meeting all other requirements   Today's Visits Date Type Provider Dept  05/22/19 Telemedicine [name], MD [facility]  Showing today's visits and meeting all other requirements   Future Appointments No visits were found meeting these conditions.  Showing future appointments within next 90 days and meeting all other requirements   I discussed the assessment and treatment plan with the patient. The patient was provided an opportunity to ask questions and all were answered. The patient agreed with the plan and demonstrated an understanding of the instructions.  Patient advised to call back or seek an in-person evaluation if the symptoms or condition worsens.  Duration of encounter: 25 minutes.  Note by: Gaspar Cola, MD Date: 05/22/2019; Time: 2:46 PM

## 2019-05-22 ENCOUNTER — Ambulatory Visit: Payer: Medicare Other | Attending: Pain Medicine | Admitting: Pain Medicine

## 2019-05-22 ENCOUNTER — Other Ambulatory Visit: Payer: Self-pay | Admitting: Pain Medicine

## 2019-05-22 ENCOUNTER — Other Ambulatory Visit: Payer: Self-pay

## 2019-05-22 DIAGNOSIS — M7918 Myalgia, other site: Secondary | ICD-10-CM

## 2019-05-22 DIAGNOSIS — M25552 Pain in left hip: Secondary | ICD-10-CM | POA: Diagnosis not present

## 2019-05-22 DIAGNOSIS — M76891 Other specified enthesopathies of right lower limb, excluding foot: Secondary | ICD-10-CM

## 2019-05-22 DIAGNOSIS — M25551 Pain in right hip: Secondary | ICD-10-CM

## 2019-05-22 DIAGNOSIS — G894 Chronic pain syndrome: Secondary | ICD-10-CM

## 2019-05-22 DIAGNOSIS — M76892 Other specified enthesopathies of left lower limb, excluding foot: Secondary | ICD-10-CM

## 2019-05-22 DIAGNOSIS — M25562 Pain in left knee: Secondary | ICD-10-CM | POA: Diagnosis not present

## 2019-05-22 DIAGNOSIS — M47816 Spondylosis without myelopathy or radiculopathy, lumbar region: Secondary | ICD-10-CM

## 2019-05-22 DIAGNOSIS — M160 Bilateral primary osteoarthritis of hip: Secondary | ICD-10-CM

## 2019-05-22 DIAGNOSIS — M79652 Pain in left thigh: Secondary | ICD-10-CM

## 2019-05-22 DIAGNOSIS — M79604 Pain in right leg: Secondary | ICD-10-CM

## 2019-05-22 DIAGNOSIS — M79605 Pain in left leg: Secondary | ICD-10-CM

## 2019-05-22 DIAGNOSIS — G8929 Other chronic pain: Secondary | ICD-10-CM

## 2019-05-22 DIAGNOSIS — M792 Neuralgia and neuritis, unspecified: Secondary | ICD-10-CM | POA: Diagnosis not present

## 2019-05-22 MED ORDER — OXYCODONE HCL 5 MG PO TABS: 5.0000 mg | ORAL_TABLET | Freq: Every evening | ORAL | 0 refills | Status: DC | PRN

## 2019-05-22 MED ORDER — PREGABALIN 50 MG PO CAPS: 150.0000 mg | ORAL_CAPSULE | Freq: Every day | ORAL | 5 refills | Status: DC

## 2019-05-22 NOTE — Patient Instructions (Addendum)
____________________________________________________________________________________________  Pain Prevention Technique  Definition:   A technique used to minimize the effects of an activity known to cause inflammation or swelling, which in turn leads to an increase in pain.  Purpose: To prevent swelling from occurring. It is based on the fact that it is easier to prevent swelling from happening than it is to get rid of it, once it occurs.  Contraindications: 1. Anyone with allergy or hypersensitivity to the recommended medications. 2. Anyone taking anticoagulants (Blood Thinners) (e.g., Coumadin, Warfarin, Plavix, etc.). 3. Patients in Renal Failure.  Technique: Before you undertake an activity known to cause pain, or a flare-up of your chronic pain, and before you experience any pain, do the following:  1. On a full stomach, take 4 (four) over the counter Ibuprofens 200mg tablets (Motrin), for a total of 800 mg. 2. In addition, take over the counter Magnesium 400 to 500 mg, before doing the activity.  3. Six (6) hours later, again on a full stomach, repeat the Ibuprofen. 4. That night, take a warm shower and stretch under the running warm water.  This technique may be sufficient to abort the pain and discomfort before it happens. Keep in mind that it takes a lot less medication to prevent swelling than it takes to eliminate it once it occurs.  ____________________________________________________________________________________________   ____________________________________________________________________________________________  Preparing for Procedure with Sedation  Procedure appointments are limited to planned procedures: . No Prescription Refills. . No disability issues will be discussed. . No medication changes will be discussed.  Instructions: . Oral Intake: Do not eat or drink anything for at least 8 hours prior to your procedure. . Transportation: Public transportation  is not allowed. Bring an adult driver. The driver must be physically present in our waiting room before any procedure can be started. . Physical Assistance: Bring an adult physically capable of assisting you, in the event you need help. This adult should keep you company at home for at least 6 hours after the procedure. . Blood Pressure Medicine: Take your blood pressure medicine with a sip of water the morning of the procedure. . Blood thinners: Notify our staff if you are taking any blood thinners. Depending on which one you take, there will be specific instructions on how and when to stop it. . Diabetics on insulin: Notify the staff so that you can be scheduled 1st case in the morning. If your diabetes requires high dose insulin, take only  of your normal insulin dose the morning of the procedure and notify the staff that you have done so. . Preventing infections: Shower with an antibacterial soap the morning of your procedure. . Build-up your immune system: Take 1000 mg of Vitamin C with every meal (3 times a day) the day prior to your procedure. . Antibiotics: Inform the staff if you have a condition or reason that requires you to take antibiotics before dental procedures. . Pregnancy: If you are pregnant, call and cancel the procedure. . Sickness: If you have a cold, fever, or any active infections, call and cancel the procedure. . Arrival: You must be in the facility at least 30 minutes prior to your scheduled procedure. . Children: Do not bring children with you. . Dress appropriately: Bring dark clothing that you would not mind if they get stained. . Valuables: Do not bring any jewelry or valuables.  Reasons to call and reschedule or cancel your procedure: (Following these recommendations will minimize the risk of a serious complication.) . Surgeries: Avoid having   procedures within 2 weeks of any surgery. (Avoid for 2 weeks before or after any surgery). . Flu Shots: Avoid having procedures  within 2 weeks of a flu shots or . (Avoid for 2 weeks before or after immunizations). . Barium: Avoid having a procedure within 7-10 days after having had a radiological study involving the use of radiological contrast. (Myelograms, Barium swallow or enema study). . Heart attacks: Avoid any elective procedures or surgeries for the initial 6 months after a "Myocardial Infarction" (Heart Attack). . Blood thinners: It is imperative that you stop these medications before procedures. Let us know if you if you take any blood thinner.  . Infection: Avoid procedures during or within two weeks of an infection (including chest colds or gastrointestinal problems). Symptoms associated with infections include: Localized redness, fever, chills, night sweats or profuse sweating, burning sensation when voiding, cough, congestion, stuffiness, runny nose, sore throat, diarrhea, nausea, vomiting, cold or Flu symptoms, recent or current infections. It is specially important if the infection is over the area that we intend to treat. . Heart and lung problems: Symptoms that may suggest an active cardiopulmonary problem include: cough, chest pain, breathing difficulties or shortness of breath, dizziness, ankle swelling, uncontrolled high or unusually low blood pressure, and/or palpitations. If you are experiencing any of these symptoms, cancel your procedure and contact your primary care physician for an evaluation.  Remember:  Regular Business hours are:  Monday to Thursday 8:00 AM to 4:00 PM  Provider's Schedule: Shaquera Ansley, MD:  Procedure days: Tuesday and Thursday 7:30 AM to 4:00 PM  Bilal Lateef, MD:  Procedure days: Monday and Wednesday 7:30 AM to 4:00 PM ____________________________________________________________________________________________    

## 2019-06-05 DIAGNOSIS — M160 Bilateral primary osteoarthritis of hip: Secondary | ICD-10-CM | POA: Diagnosis not present

## 2019-06-05 DIAGNOSIS — Z6831 Body mass index (BMI) 31.0-31.9, adult: Secondary | ICD-10-CM | POA: Diagnosis not present

## 2019-06-05 DIAGNOSIS — M48061 Spinal stenosis, lumbar region without neurogenic claudication: Secondary | ICD-10-CM | POA: Diagnosis not present

## 2019-06-05 DIAGNOSIS — M25559 Pain in unspecified hip: Secondary | ICD-10-CM | POA: Diagnosis not present

## 2019-06-06 ENCOUNTER — Ambulatory Visit
Admission: RE | Admit: 2019-06-06 | Discharge: 2019-06-06 | Disposition: A | Discharge status: Home / Self Care | Payer: Medicare Other | Source: Ambulatory Visit | Attending: Pain Medicine | Admitting: Pain Medicine

## 2019-06-06 ENCOUNTER — Ambulatory Visit (HOSPITAL_BASED_OUTPATIENT_CLINIC_OR_DEPARTMENT_OTHER): Payer: Medicare Other | Admitting: Pain Medicine

## 2019-06-06 ENCOUNTER — Other Ambulatory Visit: Payer: Self-pay

## 2019-06-06 ENCOUNTER — Encounter: Payer: Self-pay | Admitting: Pain Medicine

## 2019-06-06 VITALS — BP 127/64 | HR 61 | Temp 97.9°F | Resp 15 | Ht 67.0 in | Wt 200.0 lb

## 2019-06-06 DIAGNOSIS — M7918 Myalgia, other site: Secondary | ICD-10-CM | POA: Insufficient documentation

## 2019-06-06 DIAGNOSIS — M160 Bilateral primary osteoarthritis of hip: Secondary | ICD-10-CM

## 2019-06-06 DIAGNOSIS — M7061 Trochanteric bursitis, right hip: Secondary | ICD-10-CM | POA: Diagnosis not present

## 2019-06-06 DIAGNOSIS — M25552 Pain in left hip: Secondary | ICD-10-CM

## 2019-06-06 DIAGNOSIS — N049 Nephrotic syndrome with unspecified morphologic changes: Secondary | ICD-10-CM | POA: Diagnosis not present

## 2019-06-06 DIAGNOSIS — M25551 Pain in right hip: Secondary | ICD-10-CM | POA: Insufficient documentation

## 2019-06-06 DIAGNOSIS — M76891 Other specified enthesopathies of right lower limb, excluding foot: Secondary | ICD-10-CM | POA: Insufficient documentation

## 2019-06-06 DIAGNOSIS — M7062 Trochanteric bursitis, left hip: Secondary | ICD-10-CM | POA: Diagnosis not present

## 2019-06-06 DIAGNOSIS — G8929 Other chronic pain: Secondary | ICD-10-CM

## 2019-06-06 DIAGNOSIS — M76892 Other specified enthesopathies of left lower limb, excluding foot: Secondary | ICD-10-CM

## 2019-06-06 MED ORDER — ROPIVACAINE HCL 2 MG/ML IJ SOLN
9.0000 mL | Freq: Once | INTRAMUSCULAR | Status: AC
  Administered 2019-06-06: 9 mL via INTRA_ARTICULAR

## 2019-06-06 MED ORDER — MIDAZOLAM HCL 5 MG/5ML IJ SOLN
INTRAMUSCULAR | Status: AC
  Filled 2019-06-06: qty 5

## 2019-06-06 MED ORDER — METHYLPREDNISOLONE ACETATE 80 MG/ML IJ SUSP
80.0000 mg | Freq: Once | INTRAMUSCULAR | Status: AC
  Administered 2019-06-06: 10:00:00 80 mg via INTRA_ARTICULAR

## 2019-06-06 MED ORDER — ROPIVACAINE HCL 2 MG/ML IJ SOLN
INTRAMUSCULAR | Status: AC
  Filled 2019-06-06: qty 10

## 2019-06-06 MED ORDER — LIDOCAINE HCL 2 % IJ SOLN
20.0000 mL | Freq: Once | INTRAMUSCULAR | Status: AC
  Administered 2019-06-06: 400 mg

## 2019-06-06 MED ORDER — FENTANYL CITRATE (PF) 100 MCG/2ML IJ SOLN
INTRAMUSCULAR | Status: AC
  Filled 2019-06-06: qty 2

## 2019-06-06 MED ORDER — MIDAZOLAM HCL 5 MG/5ML IJ SOLN
1.0000 mg | INTRAMUSCULAR | Status: DC | PRN
  Administered 2019-06-06: 10:00:00 1 mg via INTRAVENOUS
  Administered 2019-06-06: 2 mg via INTRAVENOUS

## 2019-06-06 MED ORDER — FENTANYL CITRATE (PF) 100 MCG/2ML IJ SOLN
25.0000 ug | INTRAMUSCULAR | Status: DC | PRN
  Administered 2019-06-06: 50 ug via INTRAVENOUS

## 2019-06-06 MED ORDER — IOHEXOL 180 MG/ML  SOLN
10.0000 mL | Freq: Once | INTRAMUSCULAR | Status: AC
  Administered 2019-06-06: 10:00:00 10 mL via INTRA_ARTICULAR

## 2019-06-06 MED ORDER — LACTATED RINGERS IV SOLN
1000.0000 mL | Freq: Once | INTRAVENOUS | Status: AC
  Administered 2019-06-06: 10:00:00 1000 mL via INTRAVENOUS

## 2019-06-06 MED ORDER — METHYLPREDNISOLONE ACETATE 80 MG/ML IJ SUSP
INTRAMUSCULAR | Status: AC
  Filled 2019-06-06: qty 1

## 2019-06-06 MED ORDER — LIDOCAINE HCL 2 % IJ SOLN
INTRAMUSCULAR | Status: AC
  Filled 2019-06-06: qty 20

## 2019-06-06 MED ORDER — IOHEXOL 180 MG/ML  SOLN
INTRAMUSCULAR | Status: AC
  Filled 2019-06-06: qty 20

## 2019-06-06 NOTE — Progress Notes (Signed)
PROVIDER NOTE: Information contained herein reflects review and annotations entered in association with encounter. Interpretation of such information and data should be left to medically-trained personnel. Information provided to patient can be located elsewhere in the medical record under "Patient Instructions". Document created using STT-dictation technology, any transcriptional errors that may result from process are unintentional.    Patient: Matthew Maldonado  Service Category: Procedure  Provider: Gaspar Cola, MD  DOB: 1951/02/09  DOS: 06/06/2019  Location: [facility]  MRN: [id number]  Setting: Ambulatory - outpatient  Referring Provider: Nobie Putnam *  Type: Established Patient  Specialty: Interventional Pain Management  PCP: Olin Hauser, DO   Primary Reason for Visit: Interventional Pain Management Treatment. CC: Hip Pain  Procedure #1:  Anesthesia, Analgesia, Anxiolysis:  Type: Intra-Articular Hip Injection #1  Primary Purpose: Diagnostic Region: Posterolateral hip joint area. Level: Lower pelvic and hip joint level. Target Area: Superior aspect of the hip joint cavity, going thru the superior portion of the capsular ligament. Approach: Posterolateral approach. Laterality: Bilateral  Type: Moderate (Conscious) Sedation combined with Local Anesthesia Indication(s): Analgesia and Anxiety Route: Intravenous (IV) IV Access: Secured Sedation: Meaningful verbal contact was maintained at all times during the procedure  Local Anesthetic: Lidocaine 1-2%  Position: Prone Area Prepped: Entire Posterolateral hip area. Prepping solution: DuraPrep (Iodine Povacrylex [0.7% available iodine] and Isopropyl Alcohol, 74% w/w)   Procedure #2:    Type: Gluteofemoral Bursa Injection #1  Primary Purpose: Diagnostic Region: Upper (proximal) Femoral Region Level: Hip Joint Target Area: Superior aspect of the hip joint cavity, going thru the superior portion  of the capsular ligament. Approach: Posterolateral approach Laterality: Bilateral     Indications: 1. Chronic hip pain (Tertiary Area of Pain) (Bilateral) (L>R)   2. Osteoarthritis of hips (Bilateral)   3. Trochanteric bursitis of hips (Bilateral)   4. Enthesopathy of hip region (Bilateral)   5. Chronic buttock pain (Secondary Area of Pain) (Bilateral) (L>R)    Pain Score: Pre-procedure: 4 /10 Post-procedure: 0-No pain/10   Pre-op Assessment:  Matthew Maldonado is a 69 y.o. (year old), male patient, seen today for interventional treatment. He  has a past surgical history that includes Renal biopsy; Nasal septoplasty w/ turbinoplasty (Bilateral, 03/22/2018); Nasopharyngoscopy eustation tube balloon dilation (Bilateral, 03/22/2018); Nasal endoscopy (Bilateral, 03/22/2018); Excision nasal mass (Left, 03/22/2018); and Colonoscopy with propofol (N/A, 04/04/2019). Matthew Maldonado has a current medication list which includes the following prescription(s): atorvastatin, buspirone, cyclobenzaprine, diclofenac sodium, esomeprazole, fluticasone, ketoconazole, metoprolol succinate, multivitamin with minerals, oxycodone, [START ON 06/25/2019] oxycodone, [START ON 07/25/2019] oxycodone, pregabalin, and venlafaxine xr, and the following Facility-Administered Medications: fentanyl and midazolam. His primarily concern today is the Hip Pain  Initial Vital Signs:  Pulse/HCG Rate: 66ECG Heart Rate: (!) 51 Temp: 98.1 F (36.7 C) Resp: 16 BP: 117/83 SpO2: 98 %  BMI: Estimated body mass index is 31.32 kg/m as calculated from the following:   Height as of this encounter: 5\' 7"  (1.702 m).   Weight as of this encounter: 200 lb (90.7 kg).  Risk Assessment: Allergies: Reviewed. He has No Known Allergies.  Allergy Precautions: None required Coagulopathies: Reviewed. None identified.  Blood-thinner therapy: None at this time Active Infection(s): Reviewed. None identified. Matthew Maldonado is afebrile  Site Confirmation: Matthew Maldonado was asked to  confirm the procedure and laterality before marking the site Procedure checklist: Completed Consent: Before the procedure and under the influence of no sedative(s), amnesic(s), or anxiolytics, the patient was informed of the treatment options, risks and possible  complications. To fulfill our ethical and legal obligations, as recommended by the American Medical Association's Code of Ethics, I have informed the patient of my clinical impression; the nature and purpose of the treatment or procedure; the risks, benefits, and possible complications of the intervention; the alternatives, including doing nothing; the risk(s) and benefit(s) of the alternative treatment(s) or procedure(s); and the risk(s) and benefit(s) of doing nothing. The patient was provided information about the general risks and possible complications associated with the procedure. These may include, but are not limited to: failure to achieve desired goals, infection, bleeding, organ or nerve damage, allergic reactions, paralysis, and death. In addition, the patient was informed of those risks and complications associated to the procedure, such as failure to decrease pain; infection; bleeding; organ or nerve damage with subsequent damage to sensory, motor, and/or autonomic systems, resulting in permanent pain, numbness, and/or weakness of one or several areas of the body; allergic reactions; (i.e.: anaphylactic reaction); and/or death. Furthermore, the patient was informed of those risks and complications associated with the medications. These include, but are not limited to: allergic reactions (i.e.: anaphylactic or anaphylactoid reaction(s)); adrenal axis suppression; blood sugar elevation that in diabetics may result in ketoacidosis or comma; water retention that in patients with history of congestive heart failure may result in shortness of breath, pulmonary edema, and decompensation with resultant heart failure; weight gain; swelling or  edema; medication-induced neural toxicity; particulate matter embolism and blood vessel occlusion with resultant organ, and/or nervous system infarction; and/or aseptic necrosis of one or more joints. Finally, the patient was informed that Medicine is not an exact science; therefore, there is also the possibility of unforeseen or unpredictable risks and/or possible complications that may result in a catastrophic outcome. The patient indicated having understood very clearly. We have given the patient no guarantees and we have made no promises. Enough time was given to the patient to ask questions, all of which were answered to the patient's satisfaction. Mr. Sugimoto has indicated that he wanted to continue with the procedure. Attestation: I, the ordering provider, attest that I have discussed with the patient the benefits, risks, side-effects, alternatives, likelihood of achieving goals, and potential problems during recovery for the procedure that I have provided informed consent. Date  Time: 06/06/2019  9:52 AM  Pre-Procedure Preparation:  Monitoring: As per clinic protocol. Respiration, ETCO2, SpO2, BP, heart rate and rhythm monitor placed and checked for adequate function Safety Precautions: Patient was assessed for positional comfort and pressure points before starting the procedure. Time-out: I initiated and conducted the "Time-out" before starting the procedure, as per protocol. The patient was asked to participate by confirming the accuracy of the "Time Out" information. Verification of the correct person, site, and procedure were performed and confirmed by me, the nursing staff, and the patient. "Time-out" conducted as per Joint Commission's Universal Protocol (UP.01.01.01). Time: 1033  Description of Procedure #1:  Safety Precautions: Aspiration looking for blood return was conducted prior to all injections. At no point did we inject any substances, as a needle was being advanced. No attempts were  made at seeking any paresthesias. Safe injection practices and needle disposal techniques used. Medications properly checked for expiration dates. SDV (single dose vial) medications used. Description of the Procedure: Protocol guidelines were followed. The patient was placed in position over the fluoroscopy table. The target area was identified and the area prepped in the usual manner. Skin & deeper tissues infiltrated with local anesthetic. Appropriate amount of time allowed to pass for  local anesthetics to take effect. The procedure needles were then advanced to the target area. Proper needle placement secured. Negative aspiration confirmed. Solution injected in intermittent fashion, asking for systemic symptoms every 0.5cc of injectate. The needles were then removed and the area cleansed, making sure to leave some of the prepping solution back to take advantage of its long term bactericidal properties.  Start Time: 1033 hrs. Materials:  Needle(s) Type: Spinal Needle Gauge: 22G Length: 5.0-in Medication(s): Please see orders for medications and dosing details.  Imaging Guidance (Non-Spinal):          Type of Imaging Technique: Fluoroscopy Guidance (Non-Spinal) Indication(s): Assistance in needle guidance and placement for procedures requiring needle placement in or near specific anatomical locations not easily accessible without such assistance. Exposure Time: Please see nurses notes. Contrast: None used. Fluoroscopic Guidance: I was personally present during the use of fluoroscopy. "Tunnel Vision Technique" used to obtain the best possible view of the target area. Parallax error corrected before commencing the procedure. "Direction-depth-direction" technique used to introduce the needle under continuous pulsed fluoroscopy. Once target was reached, antero-posterior, oblique, and lateral fluoroscopic projection used confirm needle placement in all planes. Images permanently stored in  EMR. Interpretation: No contrast injected.  Description of Procedure #2:  Description of the Procedure: Skin & deeper tissues infiltrated with local anesthetic. Appropriate amount of time allowed to pass for local anesthetics to take effect. The procedure needles were then advanced to the target area. Proper needle placement secured. Negative aspiration confirmed. Solution injected in intermittent fashion, asking for systemic symptoms every 0.5cc of injectate. The needles were then removed and the area cleansed, making sure to leave some of the prepping solution back to take advantage of its long term bactericidal properties.  Vitals:   06/06/19 1045 06/06/19 1054 06/06/19 1104 06/06/19 1114  BP: 123/86 110/71 128/73 127/64  Pulse: 61     Resp: 11 14 15 15   Temp:  98 F (36.7 C)  97.9 F (36.6 C)  TempSrc:      SpO2: 99% 99% 98% 98%  Weight:      Height:         End Time: 1045 hrs.  Materials:  Needle(s) Type: Spinal Needle Gauge: 22G Length: 5.0-in Medication(s): Please see orders for medications and dosing details.  Imaging Guidance (Non-Spinal):          Type of Imaging Technique: Fluoroscopy Guidance (Non-Spinal) Indication(s): Assistance in needle guidance and placement for procedures requiring needle placement in or near specific anatomical locations not easily accessible without such assistance. Exposure Time: Please see nurses notes. Contrast: Before injecting any contrast, we confirmed that the patient did not have an allergy to iodine, shellfish, or radiological contrast. Once satisfactory needle placement was completed at the desired level, radiological contrast was injected. Contrast injected under live fluoroscopy. No contrast complications. See chart for type and volume of contrast used. Fluoroscopic Guidance: I was personally present during the use of fluoroscopy. "Tunnel Vision Technique" used to obtain the best possible view of the target area. Parallax error  corrected before commencing the procedure. "Direction-depth-direction" technique used to introduce the needle under continuous pulsed fluoroscopy. Once target was reached, antero-posterior, oblique, and lateral fluoroscopic projection used confirm needle placement in all planes. Images permanently stored in EMR. Interpretation: I personally interpreted the imaging intraoperatively. Adequate needle placement confirmed in multiple planes. Appropriate spread of contrast into desired area was observed. No evidence of afferent or efferent intravascular uptake. Permanent images saved into the patient's record.  Antibiotic Prophylaxis:   Anti-infectives (From admission, onward)   None     Indication(s): None identified  Post-operative Assessment:  Post-procedure Vital Signs:  Pulse/HCG Rate: 61(!) 57 Temp: 97.9 F (36.6 C) Resp: 15 BP: 127/64 SpO2: 98 %  EBL: None  Complications: No immediate post-treatment complications observed by team, or reported by patient.  Note: The patient tolerated the entire procedure well. A repeat set of vitals were taken after the procedure and the patient was kept under observation following institutional policy, for this type of procedure. Post-procedural neurological assessment was performed, showing return to baseline, prior to discharge. The patient was provided with post-procedure discharge instructions, including a section on how to identify potential problems. Should any problems arise concerning this procedure, the patient was given instructions to immediately contact us, at any time, without hesitation. In any case, we plan to contact the patient by telephone for a follow-up status report regarding this interventional procedure.  Comments:  No additional relevant information.  Plan of Care  Orders:  Orders Placed This Encounter  Procedures  . HIP INJECTION    Scheduling Instructions:     Side: Bilateral     Sedation: With Sedation.     Timeframe:  Today  . DG PAIN CLIN[facility] C-ARM 1-60 MIN NO REPORT    Intraoperative interpretation by procedural physician at [place].    Standing Status:   Standing    Number of Occurrences:   1    Order Specific Question:   Reason for exam:    Answer:   Assistance in needle guidance and placement for procedures requiring needle placement in or near specific anatomical locations not easily accessible without such assistance.  . Informed Consent Details: Physician/Practitioner Attestation; Transcribe to consent Maldonado and obtain patient signature    Nursing Order: Transcribe to consent Maldonado and obtain patient signature. Note: Always confirm laterality of pain with Mr. Altomari, before procedure. Procedure: Hip injection Indication/Reason: Hip Joint Pain (Arthralgia) Provider Attestation: I, [place] Dossie Arbour, MD, (Pain Management Specialist), the physician/practitioner, attest that I have discussed with the patient the benefits, risks, side effects, alternatives, likelihood of achieving goals and potential problems during recovery for the procedure that I have provided informed consent.  . Provide equipment / supplies at bedside    Equipment required: Single use, disposable, "Block Tray"    Standing Status:   Standing    Number of Occurrences:   1    Order Specific Question:   Specify    Answer:   Block Tray   Chronic Opioid Analgesic:  Oxycodone IR 5 mg, 1-2 tabs PO HS (10 mg/day of oxycodone) MME/day: 15 mg/day.   Medications ordered for procedure: Meds ordered this encounter  Medications  . iohexol (OMNIPAQUE) 180 MG/ML injection 10 mL    Must be Myelogram-compatible. If not available, you may substitute with a water-soluble, non-ionic, hypoallergenic, myelogram-compatible radiological contrast medium.  Marland Kitchen lidocaine (XYLOCAINE) 2 % (with pres) injection 400 mg  . lactated ringers infusion 1,000 mL  . midazolam (VERSED) 5 MG/5ML injection 1-2 mg    Make sure Flumazenil is available in the  pyxis when using this medication. If oversedation occurs, administer 0.2 mg IV over 15 sec. If after 45 sec no response, administer 0.2 mg again over 1 min; may repeat at 1 min intervals; not to exceed 4 doses (1 mg)  . fentaNYL (SUBLIMAZE) injection 25-50 mcg    Make sure Narcan is available in the pyxis when using this medication. In the event  of respiratory depression (RR< 8/min): Titrate NARCAN (naloxone) in increments of 0.1 to 0.2 mg IV at 2-3 minute intervals, until desired degree of reversal.  . ropivacaine (PF) 2 mg/mL (0.2%) (NAROPIN) injection 9 mL  . methylPREDNISolone acetate (DEPO-MEDROL) injection 80 mg   Medications administered: We administered iohexol, lidocaine, lactated ringers, midazolam, fentaNYL, ropivacaine (PF) 2 mg/mL (0.2%), and methylPREDNISolone acetate.  See the medical record for exact dosing, route, and time of administration.  Follow-up plan:   Return in about 2 weeks (around 06/20/2019) for (VV), (PP).       Interventional treatment options: Planned, scheduled, and/or pending:      Under consideration:   Diagnostic bilateral IA hip joint + trochanteric bursa injection #1    Therapeutic/palliative (PRN):   Therapeutic/palliative left L3-4 LESI #3     Recent Visits Date Type Provider Dept  05/22/19 Telemedicine [name], MD [facility]  03/12/19 Telemedicine [name], MD [facility]  Showing recent visits within past 90 days and meeting all other requirements   Today's Visits Date Type Provider Dept  06/06/19 Procedure visit [name], MD [facility]  Showing today's visits and meeting all other requirements   Future Appointments Date Type Provider Dept  06/19/19 Appointment [name], [facility]  08/21/19 Appointment [name], MD [facility]  Showing future appointments within next 90 days and meeting all other requirements   Disposition:  Discharge home  Discharge (Date  Time): 06/06/2019; 1115 hrs.   Primary Care Physician: Olin Hauser, DO Location: [facility] Note by: Gaspar Cola, MD Date: 06/06/2019; Time: 11:31 AM  Disclaimer:  Medicine is not an Chief Strategy Officer. The only guarantee in medicine is that nothing is guaranteed. It is important to note that the decision to proceed with this intervention was based on the information collected from the patient. The Data and conclusions were drawn from the patient's questionnaire, the interview, and the physical examination. Because the information was provided in large part by the patient, it cannot be guaranteed that it has not been purposely or unconsciously manipulated. Every effort has been made to obtain as much relevant data as possible for this evaluation. It is important to note that the conclusions that lead to this procedure are derived in large part from the available data. Always take into account that the treatment will also be dependent on availability of resources and existing treatment guidelines, considered by other Pain Management Practitioners as being common knowledge and practice, at the time of the intervention. For Medico-Legal purposes, it is also important to point out that variation in procedural techniques and pharmacological choices are the acceptable norm. The indications, contraindications, technique, and results of the above procedure should only be interpreted and judged by a Board-Certified Interventional Pain Specialist with extensive familiarity and expertise in the same exact procedure and technique.

## 2019-06-06 NOTE — Progress Notes (Signed)
Safety precautions to be maintained throughout the outpatient stay will include: orient to surroundings, keep bed in low position, maintain call bell within reach at all times, provide assistance with transfer out of bed and ambulation.  

## 2019-06-06 NOTE — Patient Instructions (Signed)

## 2019-06-07 ENCOUNTER — Telehealth: Payer: Self-pay

## 2019-06-07 NOTE — Telephone Encounter (Signed)
Post Procedure  F/u .  LM

## 2019-06-18 ENCOUNTER — Telehealth: Payer: Self-pay

## 2019-06-18 ENCOUNTER — Encounter: Payer: Self-pay | Admitting: Pain Medicine

## 2019-06-18 NOTE — Progress Notes (Addendum)
Patient: Matthew Maldonado  Service Category: E/M  Provider: Gaspar Cola, MD  DOB: 02-05-1951  DOS: 06/19/2019  Location: Office  MRN: [id number]  Setting: Ambulatory outpatient  Referring Provider: Nobie Putnam *  Type: Established Patient  Specialty: Interventional Pain Management  PCP: Olin Hauser, DO  Location: Remote location  Delivery: TeleHealth     Virtual Encounter - Pain Management PROVIDER NOTE: Information contained herein reflects review and annotations entered in association with encounter. Interpretation of such information and data should be left to medically-trained personnel. Information provided to patient can be located elsewhere in the medical record under "Patient Instructions". Document created using STT-dictation technology, any transcriptional errors that may result from process are unintentional.    Contact & Pharmacy Preferred: [phone] Home: [phone] (home) Mobile: [phone] (mobile) E-mail: [email or web address]  [place], [place] [address] [place] Phone: [phone] Fax: [phone]   Pre-screening  Mr. Redmann offered "in-person" vs "virtual" encounter. He indicated preferring virtual for this encounter.   Reason COVID-19*  Social distancing based on CDC and AMA recommendations.   I contacted Margarita Grizzle on 06/19/2019 via telephone.      I clearly identified myself as [birth year]/09/2019, MD. I verified that I was speaking with the correct person using two identifiers (Name: JANTZ MAIN, and date of birth: 1950/05/03).  Consent I sought verbal advanced consent from 69/10/1950 for virtual visit interactions. I informed Mr. Monteforte of possible security and privacy concerns, risks, and limitations associated with providing "not-in-person" medical evaluation and management services. I also informed Mr. Monday of the availability of "in-person" appointments. Finally, I informed him that  there would be a charge for the virtual visit and that he could be  personally, fully or partially, financially responsible for it. Mr. Coggeshall expressed understanding and agreed to proceed.   Historic Elements   Mr. RENARD CAPERTON is a 69 y.o. year old, male patient evaluated today after his last contact with our practice on 06/07/2019. Mr. Vanwagner  has a past medical history of Anxiety, Arthritis, Blood vessel disorder, BPH (benign prostatic hyperplasia), GERD (gastroesophageal reflux disease), Insomnia, Minimal change disease, Nocturia, PSVT (paroxysmal supraventricular tachycardia) ([place]), and TIA (transient ischemic attack). He also  has a past surgical history that includes Renal biopsy; Nasal septoplasty w/ turbinoplasty (Bilateral, 03/22/2018); Nasopharyngoscopy eustation tube balloon dilation (Bilateral, 03/22/2018); Nasal endoscopy (Bilateral, 03/22/2018); Excision nasal mass (Left, 03/22/2018); and Colonoscopy with propofol (N/A, 04/04/2019). Mr. Mcnelly has a current medication list which includes the following prescription(s): atorvastatin, buspirone, cyclobenzaprine, diclofenac sodium, esomeprazole, fluticasone, ketoconazole, metoprolol succinate, multivitamin with minerals, [START ON 06/25/2019] oxycodone, [START ON 07/25/2019] oxycodone, [START ON 08/24/2019] oxycodone, pregabalin, and venlafaxine xr. He  reports that he has quit smoking. His smoking use included cigars. He quit after 4.00 years of use. He has quit using smokeless tobacco. He reports current alcohol use of about 18.0 standard drinks of alcohol per week. He reports that he does not use drugs. Mr. Manley has No Known Allergies.   HPI  Today, he is being contacted for both, medication management and a post-procedure assessment. The patient indicates doing well with the current medication regimen. No adverse reactions or side effects reported to the medications.  The patient also indicates having attained excellent relief of the pain with the intra-articular  injection and they gluteal femoral bursa injection to the point where yesterday he went to play golf with his friends and did  not experience any type of pain or discomfort in doing so.  This had been his goal.  At this point he indicates not really needing anything and today I have provided him with some information regarding Preemptive Analgesia.  Post-Procedure Evaluation  Procedure (06/06/2019): Diagnostic bilateral intra-articular hip joint injection #1 + bilateral gluteal femoral bursa injection #1 under fluoroscopic guidance and IV sedation Pre-procedure pain level:  4/10 Post-procedure: 0/10 (100% relief)  Sedation: Sedation provided.  Hart Rochester, RN  06/19/2019 12:51 PM  Signed Pain relief after procedure (treated area only): (Questions asked to patient) 1. Starting about 15 minutes after the procedure, and "while the area was still numb" (from the local anesthetics), were you having any of your usual pain "in that area" (the treated area)?  (NOTE: NOT including the discomfort from the needle sticks.) First 1 hour: 100 % better. First 4-6 hours: 100 % better. 2. How long did the numbness from the local anesthetics last? (More than 6 hours?) Duration:  hours.  3. How much better is your pain now, when compared to before the procedure? Current benefit: 70 % better. 4. Can you move better now? Improvement in ROM (Range of Motion): Yes. 5. Can you do more now? Improvement in function: Yes. 4. Did you have any problems with the procedure? Side-effects/Complications: Yes.  Current benefits: Defined as benefit that persist at this time.   Analgesia:  70% improved Function: Mr. Alarie reports improvement in function ROM: Mr. Billy reports improvement in ROM  Pharmacotherapy Assessment  Analgesic: Oxycodone IR 5 mg, 1-2 tabs PO HS (10 mg/day of oxycodone) MME/day: 15 mg/day.   Monitoring: [place] PMP: PDMP reviewed during this encounter.       Pharmacotherapy: No side-effects or  adverse reactions reported. Compliance: No problems identified. Effectiveness: Clinically acceptable. Plan: Refer to "POC".  UDS: No results found for: SUMMARY Laboratory Chemistry Profile   Renal Lab Results  Component Value Date   BUN 25 12/26/2018   CREATININE 0.85 12/26/2018   BCR 29 (H) 12/26/2018   GFRAA 103 12/26/2018   GFRNONAA 90 12/26/2018     Hepatic Lab Results  Component Value Date   AST 32 12/26/2018   ALT 40 09/06/2017   ALBUMIN 4.6 12/26/2018   ALKPHOS 82 12/26/2018     Electrolytes Lab Results  Component Value Date   NA 142 12/26/2018   K 4.4 12/26/2018   CL 107 (H) 12/26/2018   CALCIUM 9.4 12/26/2018   MG 2.1 12/26/2018     Bone Lab Results  Component Value Date   25OHVITD1 43 12/26/2018   25OHVITD2 <1.0 12/26/2018   25OHVITD3 43 12/26/2018     Inflammation (CRP: Acute Phase) (ESR: Chronic Phase) Lab Results  Component Value Date   CRP 1 12/26/2018   ESRSEDRATE 16 12/26/2018       Note: Above Lab results reviewed.  Imaging  DG PAIN CLINIC C-ARM 1-60 MIN NO REPORT Fluoro was used, but no Radiologist interpretation will be provided.  Please refer to "NOTES" tab for provider progress note.  Assessment  The primary encounter diagnosis was Chronic hip pain (Tertiary Area of Pain) (Bilateral) (L>R). Diagnoses of Chronic pain of lower extremity (Primary area of Pain) (Bilateral) (L>R) and Chronic pain syndrome were also pertinent to this visit.  Plan of Care  Problem-specific:  No problem-specific Assessment & Plan notes found for this encounter.  Mr. CAMAR GUYTON has a current medication list which includes the following long-term medication(s): atorvastatin, cyclobenzaprine, diclofenac sodium, esomeprazole, fluticasone,  metoprolol succinate, [START ON 06/25/2019] oxycodone, [START ON 07/25/2019] oxycodone, [START ON 08/24/2019] oxycodone, pregabalin, and venlafaxine xr.  Pharmacotherapy (Medications Ordered): Meds ordered this encounter   Medications  . oxyCODONE (OXY IR/ROXICODONE) 5 MG immediate release tablet    Sig: Take 1-2 tablets (5-10 mg total) by mouth at bedtime as needed for severe pain. Must last 30 days.    Dispense:  60 tablet    Refill:  0    Chronic Pain: STOP Act (Not applicable) Fill 1 day early if closed on refill date. Do not fill until: 08/24/2019. To last until: 09/23/2019. Avoid benzodiazepines within 8 hours of opioids   Orders:  No orders of the defined types were placed in this encounter.  Follow-up plan:   Return in about 11 weeks (around 09/04/2019) for (F2F), (MM).      Interventional treatment options: Planned, scheduled, and/or pending:      Under consideration:   Palliative procedures    Therapeutic/palliative (PRN):   Therapeutic bilateral IA hip joint injection #2  Therapeutic bilateral gluteal femoral bursa injection #2  Therapeutic/palliative left L3-4 LESI #3      Recent Visits Date Type Provider Dept  06/06/19 Procedure visit [name], MD [facility]  05/22/19 Telemedicine [name], MD [facility]  Showing recent visits within past 90 days and meeting all other requirements   Today's Visits Date Type Provider Dept  06/19/19 Telemedicine [name], MD [facility]  Showing today's visits and meeting all other requirements   Future Appointments Date Type Provider Dept  08/21/19 Appointment [name], MD [facility]  Showing future appointments within next 90 days and meeting all other requirements   I discussed the assessment and treatment plan with the patient. The patient was provided an opportunity to ask questions and all were answered. The patient agreed with the plan and demonstrated an understanding of the instructions.  Patient advised to call back or seek an in-person evaluation if the symptoms or condition worsens.  Duration of encounter: 15 minutes.  Note by: Gaspar Cola,  MD Date: 06/19/2019; Time: 12:52 PM

## 2019-06-18 NOTE — Telephone Encounter (Signed)
LM for patient to call office

## 2019-06-18 NOTE — Progress Notes (Signed)
Pain relief after procedure (treated area only): (Questions asked to patient) 1. Starting about 15 minutes after the procedure, and "while the area was still numb" (from the local anesthetics), were you having any of your usual pain "in that area" (the treated area)?  (NOTE: NOT including the discomfort from the needle sticks.) First 1 hour: 100 % better. First 4-6 hours: 100 % better. 2. How long did the numbness from the local anesthetics last? (More than 6 hours?) Duration:  hours.  3. How much better is your pain now, when compared to before the procedure? Current benefit: 70 % better. 4. Can you move better now? Improvement in ROM (Range of Motion): Yes. 5. Can you do more now? Improvement in function: Yes. 4. Did you have any problems with the procedure? Side-effects/Complications: Yes.

## 2019-06-19 ENCOUNTER — Ambulatory Visit: Payer: Medicare Other | Attending: Pain Medicine | Admitting: Pain Medicine

## 2019-06-19 ENCOUNTER — Other Ambulatory Visit: Payer: Self-pay

## 2019-06-19 DIAGNOSIS — M79605 Pain in left leg: Secondary | ICD-10-CM

## 2019-06-19 DIAGNOSIS — M25552 Pain in left hip: Secondary | ICD-10-CM

## 2019-06-19 DIAGNOSIS — G894 Chronic pain syndrome: Secondary | ICD-10-CM | POA: Diagnosis not present

## 2019-06-19 DIAGNOSIS — M25551 Pain in right hip: Secondary | ICD-10-CM | POA: Diagnosis not present

## 2019-06-19 DIAGNOSIS — M79604 Pain in right leg: Secondary | ICD-10-CM

## 2019-06-19 DIAGNOSIS — G8929 Other chronic pain: Secondary | ICD-10-CM

## 2019-06-19 MED ORDER — OXYCODONE HCL 5 MG PO TABS: 5.0000 mg | ORAL_TABLET | Freq: Every evening | ORAL | 0 refills | Status: DC | PRN

## 2019-06-19 NOTE — Patient Instructions (Signed)

## 2019-07-02 ENCOUNTER — Other Ambulatory Visit: Payer: Self-pay | Admitting: Family Medicine

## 2019-07-02 DIAGNOSIS — E782 Mixed hyperlipidemia: Secondary | ICD-10-CM

## 2019-07-08 DIAGNOSIS — M48061 Spinal stenosis, lumbar region without neurogenic claudication: Secondary | ICD-10-CM | POA: Diagnosis not present

## 2019-07-09 ENCOUNTER — Ambulatory Visit (HOSPITAL_BASED_OUTPATIENT_CLINIC_OR_DEPARTMENT_OTHER): Payer: Medicare Other | Admitting: Pain Medicine

## 2019-07-09 ENCOUNTER — Encounter: Payer: Self-pay | Admitting: Pain Medicine

## 2019-07-09 ENCOUNTER — Ambulatory Visit
Admission: RE | Admit: 2019-07-09 | Discharge: 2019-07-09 | Disposition: A | Discharge status: Home / Self Care | Payer: Medicare Other | Source: Ambulatory Visit | Attending: Pain Medicine | Admitting: Pain Medicine

## 2019-07-09 ENCOUNTER — Other Ambulatory Visit: Payer: Self-pay

## 2019-07-09 VITALS — BP 117/84 | HR 64 | Temp 97.3°F | Resp 14 | Ht 67.0 in | Wt 193.0 lb

## 2019-07-09 DIAGNOSIS — M79605 Pain in left leg: Secondary | ICD-10-CM | POA: Diagnosis not present

## 2019-07-09 DIAGNOSIS — M5416 Radiculopathy, lumbar region: Secondary | ICD-10-CM

## 2019-07-09 DIAGNOSIS — M5137 Other intervertebral disc degeneration, lumbosacral region: Secondary | ICD-10-CM

## 2019-07-09 DIAGNOSIS — M5442 Lumbago with sciatica, left side: Secondary | ICD-10-CM | POA: Diagnosis not present

## 2019-07-09 DIAGNOSIS — M5441 Lumbago with sciatica, right side: Secondary | ICD-10-CM | POA: Insufficient documentation

## 2019-07-09 DIAGNOSIS — M79604 Pain in right leg: Secondary | ICD-10-CM | POA: Insufficient documentation

## 2019-07-09 DIAGNOSIS — G8929 Other chronic pain: Secondary | ICD-10-CM | POA: Insufficient documentation

## 2019-07-09 MED ORDER — SODIUM CHLORIDE 0.9% FLUSH
2.0000 mL | Freq: Once | INTRAVENOUS | Status: AC
  Administered 2019-07-09: 2 mL

## 2019-07-09 MED ORDER — TRIAMCINOLONE ACETONIDE 40 MG/ML IJ SUSP
40.0000 mg | Freq: Once | INTRAMUSCULAR | Status: AC
  Administered 2019-07-09: 40 mg

## 2019-07-09 MED ORDER — ROPIVACAINE HCL 2 MG/ML IJ SOLN
INTRAMUSCULAR | Status: AC
  Filled 2019-07-09: qty 10

## 2019-07-09 MED ORDER — TRIAMCINOLONE ACETONIDE 40 MG/ML IJ SUSP
INTRAMUSCULAR | Status: AC
  Filled 2019-07-09: qty 1

## 2019-07-09 MED ORDER — SODIUM CHLORIDE (PF) 0.9 % IJ SOLN
INTRAMUSCULAR | Status: AC
  Filled 2019-07-09: qty 10

## 2019-07-09 MED ORDER — LIDOCAINE HCL 2 % IJ SOLN
20.0000 mL | Freq: Once | INTRAMUSCULAR | Status: AC
  Administered 2019-07-09: 400 mg

## 2019-07-09 MED ORDER — LIDOCAINE HCL 2 % IJ SOLN
INTRAMUSCULAR | Status: AC
  Filled 2019-07-09: qty 20

## 2019-07-09 MED ORDER — ROPIVACAINE HCL 2 MG/ML IJ SOLN
2.0000 mL | Freq: Once | INTRAMUSCULAR | Status: AC
  Administered 2019-07-09: 2 mL via EPIDURAL

## 2019-07-09 MED ORDER — IOHEXOL 180 MG/ML  SOLN
10.0000 mL | Freq: Once | INTRAMUSCULAR | Status: AC
  Administered 2019-07-09: 10 mL via EPIDURAL
  Filled 2019-07-09: qty 20

## 2019-07-09 NOTE — Progress Notes (Signed)
PROVIDER NOTE: Information contained herein reflects review and annotations entered in association with encounter. Interpretation of such information and data should be left to medically-trained personnel. Information provided to patient can be located elsewhere in the medical record under "Patient Instructions". Document created using STT-dictation technology, any transcriptional errors that may result from process are unintentional.    Patient: Matthew Maldonado  Service Category: Procedure  Provider: Gaspar Cola, MD  DOB: [birth year]  DOS: 07/09/2019  Location: [facility]  MRN: [id number]  Setting: Ambulatory - outpatient  Referring Provider: [name], MD  Type: Established Patient  Specialty: Interventional Pain Management  PCP: Olin Hauser, DO   Primary Reason for Visit: Interventional Pain Management Treatment. CC: Leg Pain (left)  Procedure:          Anesthesia, Analgesia, Anxiolysis:  Type: Therapeutic Inter-Laminar Epidural Steroid Injection  #3  Region: Lumbar Level: L3-4 Level. Laterality: Left Paramedial  Type: Local Anesthesia Indication(s): Analgesia         Route: Infiltration ([place]/IM) IV Access: Declined Sedation: Declined  Local Anesthetic: Lidocaine 1-2%  Position: Prone with head of the table was raised to facilitate breathing.   Indications: 1. DDD (degenerative disc disease), lumbosacral   2. Chronic lumbar radiculopathy (Left)   3. Chronic pain of lower extremity (Primary area of Pain) (Bilateral) (L>R)    Pain Score: Pre-procedure: 3 /10 Post-procedure: 0-No pain/10   Pre-op Assessment:  Matthew Maldonado is a 69 y.o. (year old), male patient, seen today for interventional treatment. He  has a past surgical history that includes Renal biopsy; Nasal septoplasty w/ turbinoplasty (Bilateral, 03/22/2018); Nasopharyngoscopy eustation tube balloon dilation (Bilateral, 03/22/2018); Nasal endoscopy (Bilateral, 03/22/2018); Excision nasal mass  (Left, 03/22/2018); and Colonoscopy with propofol (N/A, 04/04/2019). Matthew Maldonado has a current medication list which includes the following prescription(s): atorvastatin, buspirone, cyclobenzaprine, diclofenac sodium, esomeprazole, fluticasone, ketoconazole, metoprolol succinate, multivitamin with minerals, oxycodone, [START ON 07/25/2019] oxycodone, [START ON 08/24/2019] oxycodone, pregabalin, and venlafaxine xr. His primarily concern today is the Leg Pain (left)  Initial Vital Signs:  Pulse/HCG Rate: 70  Temp: (!) 97.3 F (36.3 C) Resp: 16 BP: (!) 130/91 SpO2: 100 %  BMI: Estimated body mass index is 30.23 kg/m as calculated from the following:   Height as of this encounter: 5\' 7"  (1.702 m).   Weight as of this encounter: 193 lb (87.5 kg).  Risk Assessment: Allergies: Reviewed. He has No Known Allergies.  Allergy Precautions: None required Coagulopathies: Reviewed. None identified.  Blood-thinner therapy: None at this time Active Infection(s): Reviewed. None identified. Matthew Maldonado is afebrile  Site Confirmation: Matthew Maldonado was asked to confirm the procedure and laterality before marking the site Procedure checklist: Completed Consent: Before the procedure and under the influence of no sedative(s), amnesic(s), or anxiolytics, the patient was informed of the treatment options, risks and possible complications. To fulfill our ethical and legal obligations, as recommended by the American Medical Association's Code of Ethics, I have informed the patient of my clinical impression; the nature and purpose of the treatment or procedure; the risks, benefits, and possible complications of the intervention; the alternatives, including doing nothing; the risk(s) and benefit(s) of the alternative treatment(s) or procedure(s); and the risk(s) and benefit(s) of doing nothing. The patient was provided information about the general risks and possible complications associated with the procedure. These may include, but  are not limited to: failure to achieve desired goals, infection, bleeding, organ or nerve damage, allergic reactions, paralysis, and death. In addition, the patient  was informed of those risks and complications associated to Spine-related procedures, such as failure to decrease pain; infection (i.e.: Meningitis, epidural or intraspinal abscess); bleeding (i.e.: epidural hematoma, subarachnoid hemorrhage, or any other type of intraspinal or peri-dural bleeding); organ or nerve damage (i.e.: Any type of peripheral nerve, nerve root, or spinal cord injury) with subsequent damage to sensory, motor, and/or autonomic systems, resulting in permanent pain, numbness, and/or weakness of one or several areas of the body; allergic reactions; (i.e.: anaphylactic reaction); and/or death. Furthermore, the patient was informed of those risks and complications associated with the medications. These include, but are not limited to: allergic reactions (i.e.: anaphylactic or anaphylactoid reaction(s)); adrenal axis suppression; blood sugar elevation that in diabetics may result in ketoacidosis or comma; water retention that in patients with history of congestive heart failure may result in shortness of breath, pulmonary edema, and decompensation with resultant heart failure; weight gain; swelling or edema; medication-induced neural toxicity; particulate matter embolism and blood vessel occlusion with resultant organ, and/or nervous system infarction; and/or aseptic necrosis of one or more joints. Finally, the patient was informed that Medicine is not an exact science; therefore, there is also the possibility of unforeseen or unpredictable risks and/or possible complications that may result in a catastrophic outcome. The patient indicated having understood very clearly. We have given the patient no guarantees and we have made no promises. Enough time was given to the patient to ask questions, all of which were answered to the  patient's satisfaction. Matthew Maldonado has indicated that he wanted to continue with the procedure. Attestation: I, the ordering provider, attest that I have discussed with the patient the benefits, risks, side-effects, alternatives, likelihood of achieving goals, and potential problems during recovery for the procedure that I have provided informed consent. Date  Time: 07/09/2019  8:31 AM  Pre-Procedure Preparation:  Monitoring: As per clinic protocol. Respiration, ETCO2, SpO2, BP, heart rate and rhythm monitor placed and checked for adequate function Safety Precautions: Patient was assessed for positional comfort and pressure points before starting the procedure. Time-out: I initiated and conducted the "Time-out" before starting the procedure, as per protocol. The patient was asked to participate by confirming the accuracy of the "Time Out" information. Verification of the correct person, site, and procedure were performed and confirmed by me, the nursing staff, and the patient. "Time-out" conducted as per Joint Commission's Universal Protocol (UP.01.01.01). Time: [id number]  Description of Procedure:          Target Area: The interlaminar space, initially targeting the lower laminar border of the superior vertebral body. Approach: Paramedial approach. Area Prepped: Entire Posterior Lumbar Region DuraPrep (Iodine Povacrylex [0.7% available iodine] and Isopropyl Alcohol, 74% w/w) Safety Precautions: Aspiration looking for blood return was conducted prior to all injections. At no point did we inject any substances, as a needle was being advanced. No attempts were made at seeking any paresthesias. Safe injection practices and needle disposal techniques used. Medications properly checked for expiration dates. SDV (single dose vial) medications used. Description of the Procedure: Protocol guidelines were followed. The procedure needle was introduced through the skin, ipsilateral to the reported pain, and advanced  to the target area. Bone was contacted and the needle walked caudad, until the lamina was cleared. The epidural space was identified using "loss-of-resistance technique" with 2-3 ml of PF-NaCl (0.9% NSS), in a 5cc LOR glass syringe.  Vitals:   07/09/19 0830 07/09/19 0850 07/09/19 0900  BP: (!) 130/91 124/85 117/84  Pulse: 70 63 64  Resp:  16 16 14   Temp: (!) 97.3 F (36.3 C)    TempSrc: Temporal    SpO2: 100% 98% 98%  Weight: 193 lb (87.5 kg)    Height: 5\' 7"  (1.702 m)      Start Time: 0855 hrs. End Time: 0859 hrs.  Materials:  Needle(s) Type: Epidural needle Gauge: 17G Length: 3.5-in Medication(s): Please see orders for medications and dosing details.  Imaging Guidance (Spinal):          Type of Imaging Technique: Fluoroscopy Guidance (Spinal) Indication(s): Assistance in needle guidance and placement for procedures requiring needle placement in or near specific anatomical locations not easily accessible without such assistance. Exposure Time: Please see nurses notes. Contrast: Before injecting any contrast, we confirmed that the patient did not have an allergy to iodine, shellfish, or radiological contrast. Once satisfactory needle placement was completed at the desired level, radiological contrast was injected. Contrast injected under live fluoroscopy. No contrast complications. See chart for type and volume of contrast used. Fluoroscopic Guidance: I was personally present during the use of fluoroscopy. "Tunnel Vision Technique" used to obtain the best possible view of the target area. Parallax error corrected before commencing the procedure. "Direction-depth-direction" technique used to introduce the needle under continuous pulsed fluoroscopy. Once target was reached, antero-posterior, oblique, and lateral fluoroscopic projection used confirm needle placement in all planes. Images permanently stored in EMR. Interpretation: I personally interpreted the imaging intraoperatively.  Adequate needle placement confirmed in multiple planes. Appropriate spread of contrast into desired area was observed. No evidence of afferent or efferent intravascular uptake. No intrathecal or subarachnoid spread observed. Permanent images saved into the patient's record.  Antibiotic Prophylaxis:   Anti-infectives (From admission, onward)   None     Indication(s): None identified  Post-operative Assessment:  Post-procedure Vital Signs:  Pulse/HCG Rate: 64  Temp: (!) 97.3 F (36.3 C) Resp: 14 BP: 117/84 SpO2: 98 %  EBL: None  Complications: No immediate post-treatment complications observed by team, or reported by patient.  Note: The patient tolerated the entire procedure well. A repeat set of vitals were taken after the procedure and the patient was kept under observation following institutional policy, for this type of procedure. Post-procedural neurological assessment was performed, showing return to baseline, prior to discharge. The patient was provided with post-procedure discharge instructions, including a section on how to identify potential problems. Should any problems arise concerning this procedure, the patient was given instructions to immediately contact us, at any time, without hesitation. In any case, we plan to contact the patient by telephone for a follow-up status report regarding this interventional procedure.  Comments:  No additional relevant information.  Plan of Care  Orders:  Orders Placed This Encounter  Procedures  . Lumbar Epidural Injection    Scheduling Instructions:     Procedure: Interlaminar LESI L3-4     Laterality: Left-sided     Sedation: No Sedation     Timeframe:  Today    Order Specific Question:   Where will this procedure be performed?    Answer:   [facility]  . [facility] C-ARM 1-60 MIN NO REPORT    Intraoperative interpretation by procedural physician at [place].    Standing Status:   Standing    Number  of Occurrences:   1    Order Specific Question:   Reason for exam:    Answer:   Assistance in needle guidance and placement for procedures requiring needle placement in or near specific anatomical locations  not easily accessible without such assistance.  . Informed Consent Details: Physician/Practitioner Attestation; Transcribe to consent Maldonado and obtain patient signature    Provider Attestation: I, [place] Dossie Arbour, MD, (Pain Management Specialist), the physician/practitioner, attest that I have discussed with the patient the benefits, risks, side effects, alternatives, likelihood of achieving goals and potential problems during recovery for the procedure that I have provided informed consent.    Scheduling Instructions:     Procedure: Lumbar epidural steroid injection under fluoroscopic guidance     Indications: Low back and/or lower extremity pain secondary to lumbar radiculitis     Note: Always confirm laterality of pain with Mr. Bussie, before procedure.     Transcribe to consent Maldonado and obtain patient signature.  . Provide equipment / supplies at bedside    Equipment required: Single use, disposable, "Epidural Tray" Epidural Catheter: NOT required    Standing Status:   Standing    Number of Occurrences:   1    Order Specific Question:   Specify    Answer:   Epidural Tray   Chronic Opioid Analgesic:  Oxycodone IR 5 mg, 1-2 tabs PO HS (10 mg/day of oxycodone) MME/day: 15 mg/day.   Medications ordered for procedure: Meds ordered this encounter  Medications  . iohexol (OMNIPAQUE) 180 MG/ML injection 10 mL    Must be Myelogram-compatible. If not available, you may substitute with a water-soluble, non-ionic, hypoallergenic, myelogram-compatible radiological contrast medium.  Marland Kitchen lidocaine (XYLOCAINE) 2 % (with pres) injection 400 mg  . sodium chloride flush (NS) 0.9 % injection 2 mL  . ropivacaine (PF) 2 mg/mL (0.2%) (NAROPIN) injection 2 mL  . triamcinolone acetonide (KENALOG-40)  injection 40 mg   Medications administered: We administered iohexol, lidocaine, sodium chloride flush, ropivacaine (PF) 2 mg/mL (0.2%), and triamcinolone acetonide.  See the medical record for exact dosing, route, and time of administration.  Follow-up plan:   Return in about 2 weeks (around 07/23/2019) for (PP), (VV).       Interventional treatment options: Planned, scheduled, and/or pending:      Under consideration:   Palliative procedures    Therapeutic/palliative (PRN):   Therapeutic bilateral IA hip joint injection #2  Therapeutic bilateral gluteal femoral bursa injection #2  Therapeutic/palliative left L3-4 LESI #3       Recent Visits Date Type Provider Dept  06/19/19 Telemedicine [name], MD [facility]  06/06/19 Procedure visit [name], [facility]  05/22/19 Telemedicine [name], MD [facility]  Showing recent visits within past 90 days and meeting all other requirements   Today's Visits Date Type Provider Dept  07/09/19 Procedure visit [name], MD [facility]  Showing today's visits and meeting all other requirements   Future Appointments Date Type Provider Dept  07/29/19 Appointment [name], MD [facility]  09/03/19 Appointment [name], MD [facility]  Showing future appointments within next 90 days and meeting all other requirements   Disposition: Discharge home  Discharge (Date  Time): 07/09/2019; 0905 hrs.   Primary Care Physician: Olin Hauser, DO Location: [facility] Note by: Gaspar Cola, MD Date: 07/09/2019; Time: 11:42 AM  Disclaimer:  Medicine is not an Chief Strategy Officer. The only guarantee in medicine is that nothing is guaranteed. It is important to note that the decision to proceed with this intervention was based on the information collected from the patient. The Data and  conclusions were drawn from the patient's questionnaire, the interview, and  the physical examination. Because the information was provided in large part by the patient, it cannot be guaranteed that it has not been purposely or unconsciously manipulated. Every effort has been made to obtain as much relevant data as possible for this evaluation. It is important to note that the conclusions that lead to this procedure are derived in large part from the available data. Always take into account that the treatment will also be dependent on availability of resources and existing treatment guidelines, considered by other Pain Management Practitioners as being common knowledge and practice, at the time of the intervention. For Medico-Legal purposes, it is also important to point out that variation in procedural techniques and pharmacological choices are the acceptable norm. The indications, contraindications, technique, and results of the above procedure should only be interpreted and judged by a Board-Certified Interventional Pain Specialist with extensive familiarity and expertise in the same exact procedure and technique.

## 2019-07-10 ENCOUNTER — Telehealth: Payer: Self-pay

## 2019-07-10 NOTE — Telephone Encounter (Signed)
Post procedure phone calls.  LM

## 2019-07-23 ENCOUNTER — Ambulatory Visit: Payer: Medicare Other | Admitting: Pain Medicine

## 2019-07-25 ENCOUNTER — Telehealth: Payer: Self-pay

## 2019-07-25 NOTE — Telephone Encounter (Signed)
Attempted to call patient for mondays VV. No answer, left message to return out call.

## 2019-07-28 NOTE — Progress Notes (Signed)
Patient: Matthew Maldonado  Service Category: E/M  Provider: Gaspar Cola, MD  DOB: 28-Apr-1950  DOS: 07/29/2019  Location: Office  MRN: [id number]  Setting: Ambulatory outpatient  Referring Provider: Nobie Putnam *  Type: Established Patient  Specialty: Interventional Pain Management  PCP: Olin Hauser, DO  Location: Remote location  Delivery: TeleHealth     Virtual Encounter - Pain Management PROVIDER NOTE: Information contained herein reflects review and annotations entered in association with encounter. Interpretation of such information and data should be left to medically-trained personnel. Information provided to patient can be located elsewhere in the medical record under "Patient Instructions". Document created using STT-dictation technology, any transcriptional errors that may result from process are unintentional.    Contact & Pharmacy Preferred: [phone] Home: [phone] (home) Mobile: [phone] (mobile) E-mail: [email or web address]  [place], [address] [place] [address] Phone: [phone] Fax: [phone]   Pre-screening  Matthew Maldonado offered "in-person" vs "virtual" encounter. He indicated preferring virtual for this encounter.   Reason COVID-19*  Social distancing based on CDC and AMA recommendations.   I contacted Matthew Maldonado on 07/29/2019 via telephone.      I clearly identified myself as Gaspar Cola, MD. I verified that I was speaking with the correct person using two identifiers (Name: Matthew Maldonado, and date of birth: [birth year]).  Consent I sought verbal advanced consent from Matthew Maldonado for virtual visit interactions. I informed Matthew Maldonado of possible security and privacy concerns, risks, and limitations associated with providing "not-in-person" medical evaluation and management services. I also informed Matthew Maldonado of the availability of "in-person" appointments. Finally, I informed him that  there would be a charge for the virtual visit and that he could be  personally, fully or partially, financially responsible for it. Matthew Maldonado expressed understanding and agreed to proceed.   Historic Elements   Matthew Maldonado is a 69 y.o. year old, male patient evaluated today after his last contact with our practice on 07/25/2019. Matthew Maldonado  has a past medical history of Anxiety, Arthritis, Blood vessel disorder, BPH (benign prostatic hyperplasia), GERD (gastroesophageal reflux disease), Insomnia, Minimal change disease, Nocturia, PSVT (paroxysmal supraventricular tachycardia) ([place]), and TIA (transient ischemic attack). He also  has a past surgical history that includes Renal biopsy; Nasal septoplasty w/ turbinoplasty (Bilateral, 03/22/2018); Nasopharyngoscopy eustation tube balloon dilation (Bilateral, 03/22/2018); Nasal endoscopy (Bilateral, 03/22/2018); Excision nasal mass (Left, 03/22/2018); and Colonoscopy with propofol (N/A, 04/04/2019). Matthew Maldonado has a current medication list which includes the following prescription(s): atorvastatin, buspirone, cyclobenzaprine, [START ON 09/08/2019] cyclobenzaprine, diclofenac sodium, [START ON 08/24/2019] diclofenac sodium, esomeprazole, fluticasone, ketoconazole, metoprolol succinate, multivitamin with minerals, [START ON 08/24/2019] oxycodone, [START ON 09/23/2019] oxycodone, [START ON 10/23/2019] oxycodone, pregabalin, and venlafaxine xr. He  reports that he has quit smoking. His smoking use included cigars. He quit after 4.00 years of use. He has quit using smokeless tobacco. He reports current alcohol use of about 18.0 standard drinks of alcohol per week. He reports that he does not use drugs. Matthew Maldonado has No Known Allergies.   HPI  Today, he is being contacted for both, medication management and a post-procedure assessment.  The patient indicates doing well with the current medication regimen. No adverse reactions or side effects reported to the medications.  The patient  indicates having attained great relief of the pain with the epidural steroid injection.  He indicates that currently he still enjoying 90% relief.  Post-Procedure Evaluation  Procedure: Therapeutic left L3-4 LESI #3 under fluoroscopic guidance, no sedation Pre-procedure pain level: 3/10 Post-procedure: 0/10 (100% relief)  Sedation: None.  Effectiveness during initial hour after procedure(Ultra-Short Term Relief):   100%.  Local anesthetic used: Long-acting (4-6 hours) Effectiveness: Defined as any analgesic benefit obtained secondary to the administration of local anesthetics. This carries significant diagnostic value as to the etiological location, or anatomical origin, of the pain. Duration of benefit is expected to coincide with the duration of the local anesthetic used.  Effectiveness during initial 4-6 hours after procedure(Short-Term Relief):   100%.  Long-term benefit: Defined as any relief past the pharmacologic duration of the local anesthetics.  Effectiveness past the initial 6 hours after procedure(Long-Term Relief):   90%.  Current benefits: Defined as benefit that persist at this time.   Analgesia:  90-100% better Function: Matthew Maldonado reports improvement in function ROM: Matthew Maldonado reports improvement in ROM  Pharmacotherapy Assessment  Analgesic: Oxycodone IR 5 mg, 1-2 tabs PO HS (10 mg/day of oxycodone) MME/day: 15 mg/day.   Monitoring: [place] PMP: PDMP reviewed during this encounter.       Pharmacotherapy: No side-effects or adverse reactions reported. Compliance: No problems identified. Effectiveness: Clinically acceptable. Plan: Refer to "POC".  UDS: No results found for: SUMMARY   Laboratory Chemistry Profile   Renal Lab Results  Component Value Date   BUN 25 12/26/2018   CREATININE 0.85 12/26/2018   BCR 29 (H) 12/26/2018   GFRAA 103 12/26/2018   GFRNONAA 90 12/26/2018     Hepatic Lab Results  Component Value Date   AST 32 12/26/2018   ALT 40 09/06/2017    ALBUMIN 4.6 12/26/2018   ALKPHOS 82 12/26/2018     Electrolytes Lab Results  Component Value Date   NA 142 12/26/2018   K 4.4 12/26/2018   CL 107 (H) 12/26/2018   CALCIUM 9.4 12/26/2018   MG 2.1 12/26/2018     Bone Lab Results  Component Value Date   25OHVITD1 43 12/26/2018   25OHVITD2 <1.0 12/26/2018   25OHVITD3 43 12/26/2018     Inflammation (CRP: Acute Phase) (ESR: Chronic Phase) Lab Results  Component Value Date   CRP 1 12/26/2018   ESRSEDRATE 16 12/26/2018       Note: Above Lab results reviewed.  Imaging  DG PAIN CLINIC C-ARM 1-60 MIN NO REPORT Fluoro was used, but no Radiologist interpretation will be provided.  Please refer to "NOTES" tab for provider progress note.  Assessment  The primary encounter diagnosis was Chronic pain syndrome. Diagnoses of Chronic low back pain (Bilateral) w/ sciatica (Bilateral) (L>R), Chronic pain of lower extremity (Primary area of Pain) (Bilateral) (L>R), Chronic hip pain (Tertiary Area of Pain) (Bilateral) (L>R), Pharmacologic therapy, Chronic musculoskeletal pain, and Osteoarthritis involving multiple joints were also pertinent to this visit.  Plan of Care  Problem-specific:  No problem-specific Assessment & Plan notes found for this encounter.  Matthew Maldonado has a current medication list which includes the following long-term medication(s): atorvastatin, cyclobenzaprine, [START ON 09/08/2019] cyclobenzaprine, diclofenac sodium, [START ON 08/24/2019] diclofenac sodium, esomeprazole, fluticasone, metoprolol succinate, [START ON 08/24/2019] oxycodone, [START ON 09/23/2019] oxycodone, [START ON 10/23/2019] oxycodone, pregabalin, and venlafaxine xr.  Pharmacotherapy (Medications Ordered): Meds ordered this encounter  Medications  . oxyCODONE (OXY IR/ROXICODONE) 5 MG immediate release tablet    Sig: Take 1-2 tablets (5-10 mg total) by mouth at bedtime as needed for severe pain. Must last 30 days.    Dispense:  60 tablet  Refill:  0     Chronic Pain: STOP Act (Not applicable) Fill 1 day early if closed on refill date. Do not fill until: 09/23/2019. To last until: 10/23/2019. Avoid benzodiazepines within 8 hours of opioids  . oxyCODONE (OXY IR/ROXICODONE) 5 MG immediate release tablet    Sig: Take 1-2 tablets (5-10 mg total) by mouth at bedtime as needed for severe pain. Must last 30 days.    Dispense:  60 tablet    Refill:  0    Chronic Pain: STOP Act (Not applicable) Fill 1 day early if closed on refill date. Do not fill until: 10/23/2019. To last until: 11/22/2019. Avoid benzodiazepines within 8 hours of opioids  . cyclobenzaprine (FLEXERIL) 10 MG tablet    Sig: Take 1 tablet (10 mg total) by mouth at bedtime as needed for muscle spasms.    Dispense:  30 tablet    Refill:  5    Fill one day early if pharmacy is closed on scheduled refill date. May substitute for generic if available.  . Diclofenac Sodium 3 % GEL    Sig: Place 4 g onto the skin 4 (four) times daily.    Dispense:  100 g    Refill:  10    Fill one day early if pharmacy is closed on scheduled refill date. May substitute for generic if available.   Orders:  Orders Placed This Encounter  Procedures  . Lumbar Epidural Injection    Standing Status:   Standing    Number of Occurrences:   9    Standing Expiration Date:   01/28/2021    Scheduling Instructions:     Purpose: Palliative     Indication: Lower extremity pain/Sciatica left (M54.32).     Side: Left-sided     Level: L3-4     Sedation: Patient's choice.     TIMEFRAME: PRN procedure. (Mr. Kakar will call when needed.)    Order Specific Question:   Where will this procedure be performed?    Answer:   [facility]  . ToxASSURE Select 13 (MW), Urine    Volume: 30 ml(s). Minimum 3 ml of urine is needed. Document temperature of fresh sample. Indications: Long term (current) use of opiate analgesic (K93.818)    Order Specific Question:   Release to patient    Answer:   Immediate   Follow-up  plan:   Return in about 16 weeks (around 11/18/2019) for (F2F), (MM), in addition, PRN Procedure(s): (L) L3-4 LESI #4.      Interventional treatment options: Planned, scheduled, and/or pending:      Under consideration:   Palliative procedures    Therapeutic/palliative (PRN):   Therapeutic bilateral IA hip joint injection #2  Therapeutic bilateral gluteal femoral bursa injection #2  Therapeutic/palliative left L3-4 LESI #4     Recent Visits Date Type Provider Dept  07/09/19 Procedure visit [name], MD [facility]  06/19/19 Telemedicine [name], MD [facility]  06/06/19 Procedure visit [name], MD [facility]  05/22/19 Telemedicine [name], MD [facility]  Showing recent visits within past 90 days and meeting all other requirements   Today's Visits Date Type Provider Dept  07/29/19 Telemedicine [name], MD [facility]  Showing today's visits and meeting all other requirements   Future Appointments Date Type Provider Dept  09/03/19 Appointment [name], MD [facility]  Showing future appointments within next 90 days and meeting all other requirements   I discussed the  assessment and treatment plan with the patient. The patient was provided an opportunity to ask questions and all were answered. The patient agreed with the plan and demonstrated an understanding of the instructions.  Patient advised to call back or seek an in-person evaluation if the symptoms or condition worsens.  Duration of encounter: 13 minutes.  Note by: Gaspar Cola, MD Date: 07/29/2019; Time: 11:30 AM

## 2019-07-29 ENCOUNTER — Telehealth: Payer: Self-pay

## 2019-07-29 ENCOUNTER — Other Ambulatory Visit: Payer: Self-pay

## 2019-07-29 ENCOUNTER — Ambulatory Visit: Payer: Medicare Other | Attending: Pain Medicine | Admitting: Pain Medicine

## 2019-07-29 ENCOUNTER — Telehealth: Payer: Self-pay | Admitting: *Deleted

## 2019-07-29 DIAGNOSIS — M7918 Myalgia, other site: Secondary | ICD-10-CM | POA: Diagnosis not present

## 2019-07-29 DIAGNOSIS — Z79899 Other long term (current) drug therapy: Secondary | ICD-10-CM | POA: Diagnosis not present

## 2019-07-29 DIAGNOSIS — G8929 Other chronic pain: Secondary | ICD-10-CM

## 2019-07-29 DIAGNOSIS — G894 Chronic pain syndrome: Secondary | ICD-10-CM

## 2019-07-29 DIAGNOSIS — M25551 Pain in right hip: Secondary | ICD-10-CM

## 2019-07-29 DIAGNOSIS — M79605 Pain in left leg: Secondary | ICD-10-CM | POA: Diagnosis not present

## 2019-07-29 DIAGNOSIS — M25552 Pain in left hip: Secondary | ICD-10-CM

## 2019-07-29 DIAGNOSIS — M8949 Other hypertrophic osteoarthropathy, multiple sites: Secondary | ICD-10-CM | POA: Diagnosis not present

## 2019-07-29 DIAGNOSIS — M79604 Pain in right leg: Secondary | ICD-10-CM

## 2019-07-29 DIAGNOSIS — M5441 Lumbago with sciatica, right side: Secondary | ICD-10-CM | POA: Diagnosis not present

## 2019-07-29 DIAGNOSIS — M150 Primary generalized (osteo)arthritis: Secondary | ICD-10-CM

## 2019-07-29 DIAGNOSIS — M5442 Lumbago with sciatica, left side: Secondary | ICD-10-CM

## 2019-07-29 DIAGNOSIS — M159 Polyosteoarthritis, unspecified: Secondary | ICD-10-CM

## 2019-07-29 MED ORDER — OXYCODONE HCL 5 MG PO TABS: 5.0000 mg | ORAL_TABLET | Freq: Every evening | ORAL | 0 refills | Status: DC | PRN

## 2019-07-29 MED ORDER — DICLOFENAC SODIUM 3 % EX GEL: 4.0000 g | Freq: Four times a day (QID) | CUTANEOUS | 10 refills | Status: AC

## 2019-07-29 MED ORDER — CYCLOBENZAPRINE HCL 10 MG PO TABS: 10.0000 mg | ORAL_TABLET | Freq: Every evening | ORAL | 5 refills | Status: DC | PRN

## 2019-07-29 NOTE — Telephone Encounter (Signed)
Attempted to call patient, no answer, Left message to call our office so we could review his past procedure.

## 2019-07-29 NOTE — Patient Instructions (Signed)
____________________________________________________________________________________________  Preparing for Procedure with Sedation  Procedure appointments are limited to planned procedures: . No Prescription Refills. . No disability issues will be discussed. . No medication changes will be discussed.  Instructions: . Oral Intake: Do not eat or drink anything for at least 8 hours prior to your procedure. (Exception: Blood Pressure Medication. See below.) . Transportation: Unless otherwise stated by your physician, you may drive yourself after the procedure. . Blood Pressure Medicine: Do not forget to take your blood pressure medicine with a sip of water the morning of the procedure. If your Diastolic (lower reading)is above 100 mmHg, elective cases will be cancelled/rescheduled. . Blood thinners: These will need to be stopped for procedures. Notify our staff if you are taking any blood thinners. Depending on which one you take, there will be specific instructions on how and when to stop it. . Diabetics on insulin: Notify the staff so that you can be scheduled 1st case in the morning. If your diabetes requires high dose insulin, take only  of your normal insulin dose the morning of the procedure and notify the staff that you have done so. . Preventing infections: Shower with an antibacterial soap the morning of your procedure. . Build-up your immune system: Take 1000 mg of Vitamin C with every meal (3 times a day) the day prior to your procedure. . Antibiotics: Inform the staff if you have a condition or reason that requires you to take antibiotics before dental procedures. . Pregnancy: If you are pregnant, call and cancel the procedure. . Sickness: If you have a cold, fever, or any active infections, call and cancel the procedure. . Arrival: You must be in the facility at least 30 minutes prior to your scheduled procedure. . Children: Do not bring children with you. . Dress appropriately:  Bring dark clothing that you would not mind if they get stained. . Valuables: Do not bring any jewelry or valuables.  Reasons to call and reschedule or cancel your procedure: (Following these recommendations will minimize the risk of a serious complication.) . Surgeries: Avoid having procedures within 2 weeks of any surgery. (Avoid for 2 weeks before or after any surgery). . Flu Shots: Avoid having procedures within 2 weeks of a flu shots or . (Avoid for 2 weeks before or after immunizations). . Barium: Avoid having a procedure within 7-10 days after having had a radiological study involving the use of radiological contrast. (Myelograms, Barium swallow or enema study). . Heart attacks: Avoid any elective procedures or surgeries for the initial 6 months after a "Myocardial Infarction" (Heart Attack). . Blood thinners: It is imperative that you stop these medications before procedures. Let us know if you if you take any blood thinner.  . Infection: Avoid procedures during or within two weeks of an infection (including chest colds or gastrointestinal problems). Symptoms associated with infections include: Localized redness, fever, chills, night sweats or profuse sweating, burning sensation when voiding, cough, congestion, stuffiness, runny nose, sore throat, diarrhea, nausea, vomiting, cold or Flu symptoms, recent or current infections. It is specially important if the infection is over the area that we intend to treat. . Heart and lung problems: Symptoms that may suggest an active cardiopulmonary problem include: cough, chest pain, breathing difficulties or shortness of breath, dizziness, ankle swelling, uncontrolled high or unusually low blood pressure, and/or palpitations. If you are experiencing any of these symptoms, cancel your procedure and contact your primary care physician for an evaluation.  Remember:  Regular Business hours are:    Monday to Thursday 8:00 AM to 4:00 PM  Provider's  Schedule: Aveena Bari, MD:  Procedure days: Tuesday and Thursday 7:30 AM to 4:00 PM  Bilal Lateef, MD:  Procedure days: Monday and Wednesday 7:30 AM to 4:00 PM ____________________________________________________________________________________________    

## 2019-08-01 DIAGNOSIS — G894 Chronic pain syndrome: Secondary | ICD-10-CM | POA: Diagnosis not present

## 2019-08-01 DIAGNOSIS — Z79899 Other long term (current) drug therapy: Secondary | ICD-10-CM | POA: Diagnosis not present

## 2019-08-05 LAB — TOXASSURE SELECT 13 (MW), URINE

## 2019-08-21 ENCOUNTER — Telehealth: Payer: Medicare Other | Admitting: Pain Medicine

## 2019-08-25 IMAGING — CR DG HIP (WITH OR WITHOUT PELVIS) 2-3V*L*
2 series · 2 of 2 positions shown · non-contrast
Comparison: No prior.

CLINICAL DATA: Hip pain.  Low back pain.

EXAM:
DG HIP (WITH OR WITHOUT PELVIS) 2-3V LEFT

[hip ap]
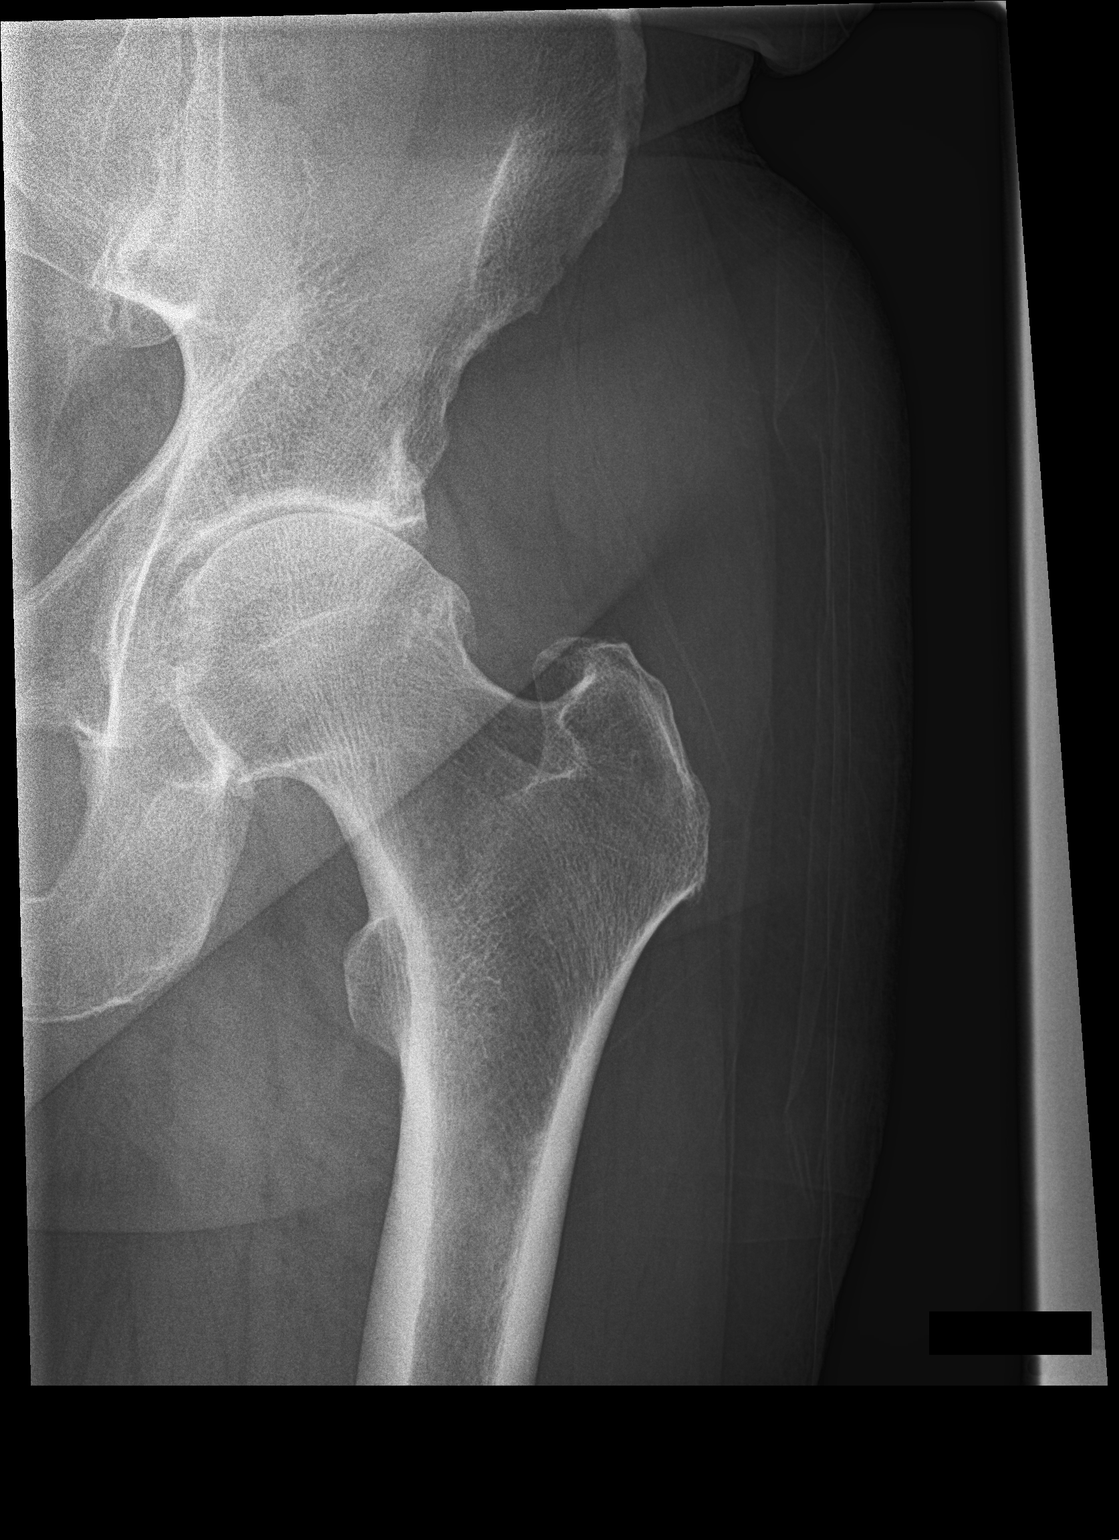

[hip lat]
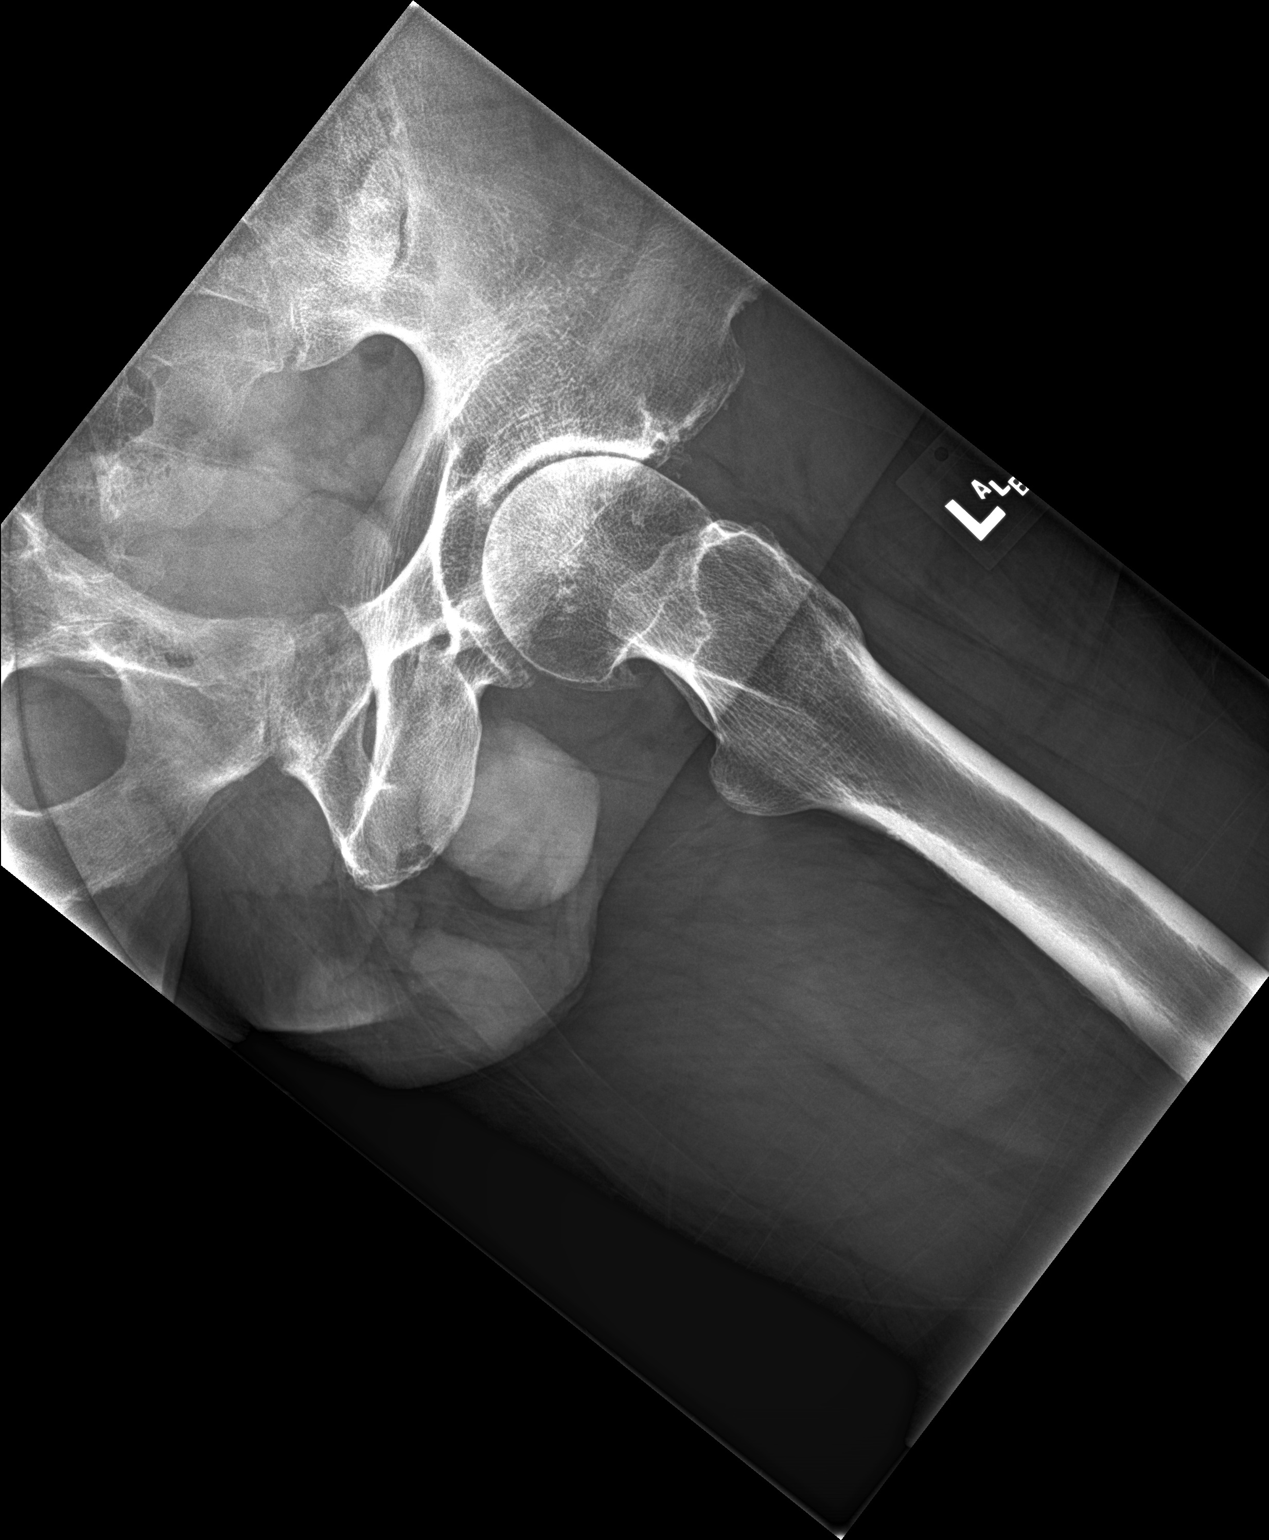

[2 of 2 positions shown; findings below may reference images not displayed]

FINDINGS: No acute bony or joint abnormality identified. No evidence of
fracture or dislocation. Degenerative changes left hip.
IMPRESSION: No acute bony abnormality identified. Degenerative changes left hip.

## 2019-08-26 IMAGING — CR DG FEMUR 2+V*R*
4 series · 4 of 4 positions shown · non-contrast
Comparison: 10/17/2017,

CLINICAL DATA: 66-year-old male with a history of

EXAM:
RIGHT FEMUR 2 VIEWS

[femur ap (1 of 2)]
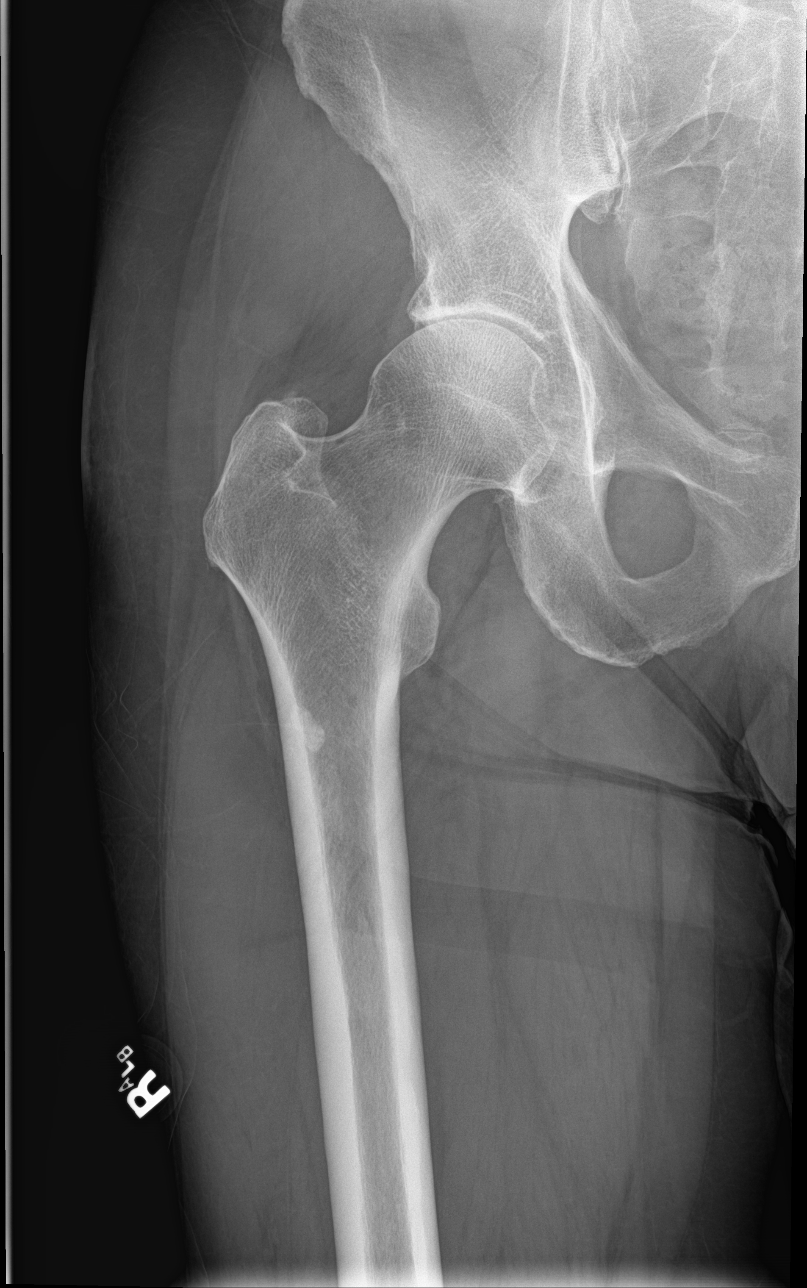

[femur ap (2 of 2)]
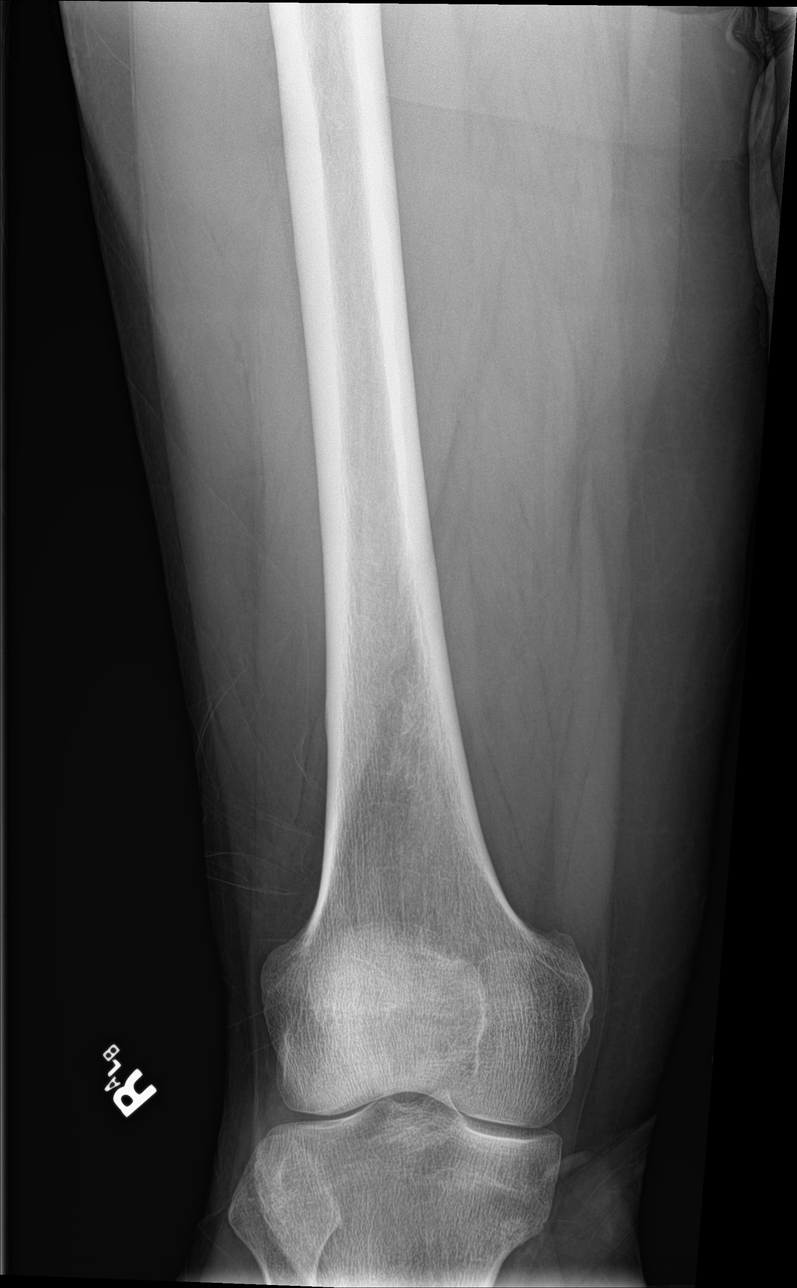

[femur lat (1 of 2)]
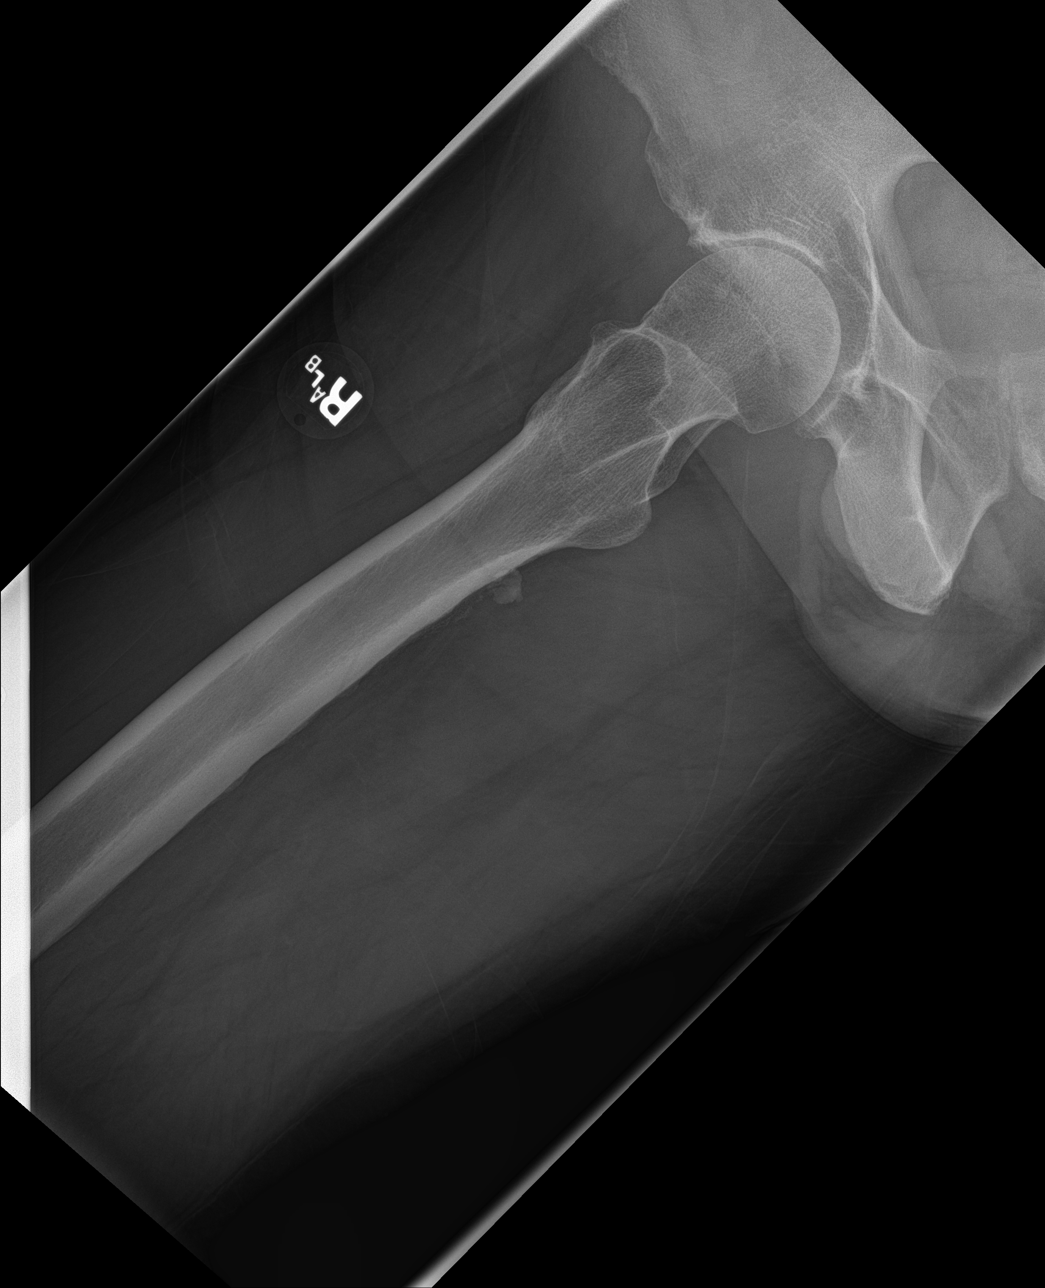

[femur lat (2 of 2)]
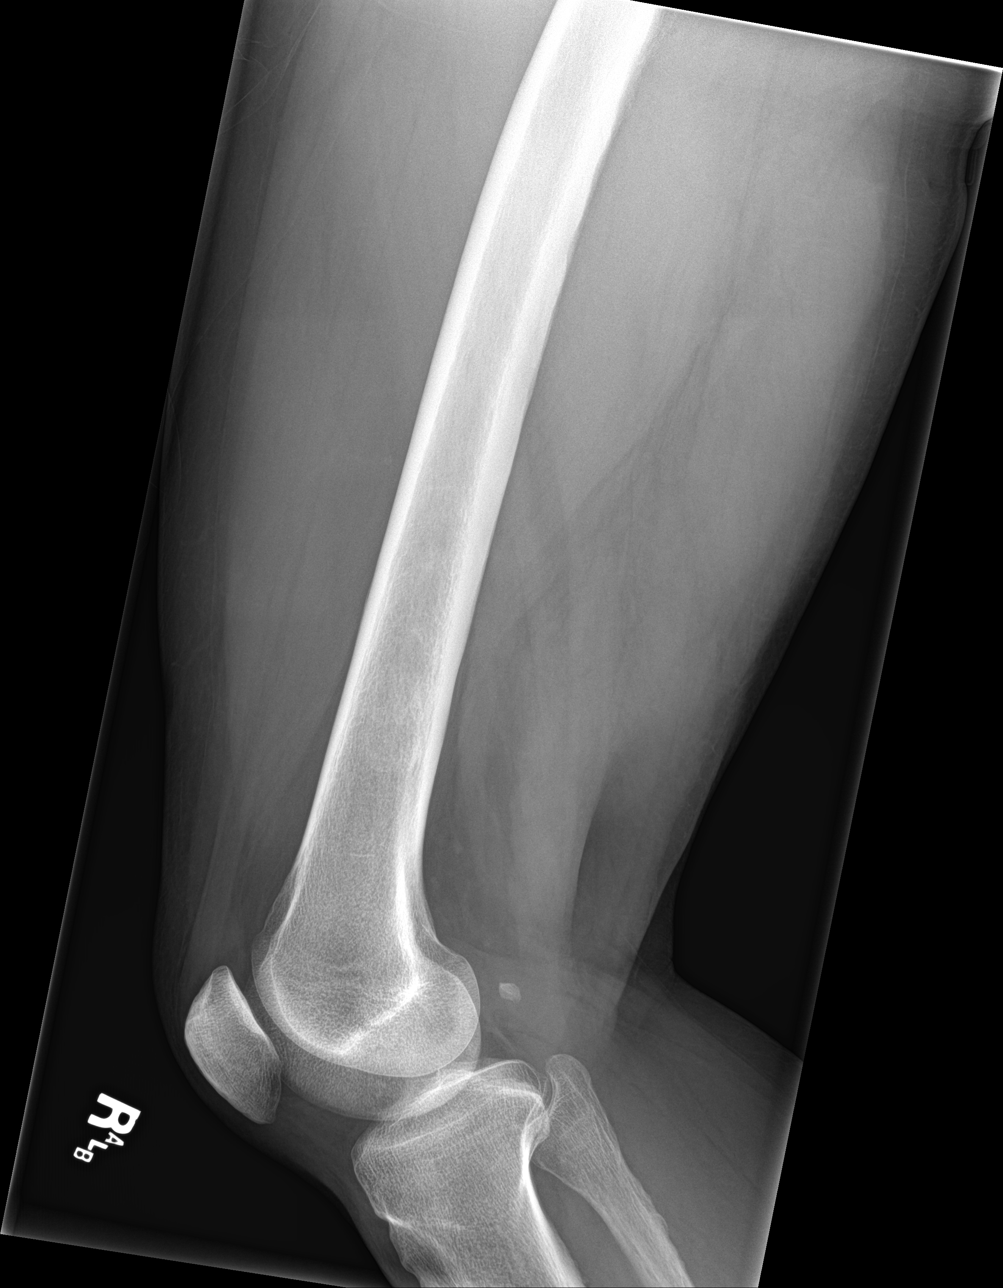

[4 of 4 positions shown; findings below may reference images not displayed]

FINDINGS: No acute displaced fracture. Osteoarthritis of the right hip. No
focal soft tissue swelling.

Oblique images demonstrate that the calcific density projects
posteriorly to the femur, and the interface with the cortex is not
well evaluated on this plain film. Size measures approximately 10
mm.

Distal femur unremarkable.
IMPRESSION: No acute bony abnormality.

The calcific density on the plain film projects posterior to the
cortex on the oblique images, unchanged from the prior plain film.
The interface with the cortex is not well evaluated. This lesion may
be arising from the cortex, or alternatively may be separate from
the cortex within the soft tissues, and both benign and malignant
entities remain on the differential. If the patient has ongoing
symptoms of pain, referral for orthopedic evaluation and potentially
cross-sectional imaging with CT may be considered.

## 2019-09-02 ENCOUNTER — Encounter: Payer: Self-pay | Admitting: Pain Medicine

## 2019-09-03 ENCOUNTER — Ambulatory Visit: Payer: Medicare Other | Admitting: Pain Medicine

## 2019-09-03 ENCOUNTER — Other Ambulatory Visit: Payer: Self-pay

## 2019-09-03 NOTE — Progress Notes (Deleted)
Scheduling mistake

## 2019-09-12 ENCOUNTER — Telehealth: Payer: Self-pay | Admitting: Urology

## 2019-09-12 NOTE — Telephone Encounter (Signed)
Pt asks for refill of Mybetriq to be sent to [facility] in [place] for sudden onset of urgency. Please advise

## 2019-09-12 NOTE — Telephone Encounter (Signed)
Mybetriq 25 mg daily, one daily, # 30 with 2 refills.

## 2019-09-13 ENCOUNTER — Other Ambulatory Visit: Payer: Self-pay | Admitting: Family Medicine

## 2019-09-13 MED ORDER — MIRABEGRON ER 25 MG PO TB24
25.0000 mg | ORAL_TABLET | Freq: Every day | ORAL | 2 refills | Status: DC
Start: 2019-09-13 — End: 2019-10-14

## 2019-09-25 ENCOUNTER — Other Ambulatory Visit: Payer: Self-pay | Admitting: Family Medicine

## 2019-09-25 DIAGNOSIS — L219 Seborrheic dermatitis, unspecified: Secondary | ICD-10-CM

## 2019-09-27 ENCOUNTER — Other Ambulatory Visit: Payer: Self-pay | Admitting: Family Medicine

## 2019-09-27 DIAGNOSIS — E782 Mixed hyperlipidemia: Secondary | ICD-10-CM

## 2019-09-27 NOTE — Telephone Encounter (Signed)
Requested medication (s) are due for refill today -yes  Requested medication (s) are on the active medication list -yes  Future visit scheduled -no  Last refill: 07/02/19 #90  Notes to clinic: Rx request fails lab protocol- 2019 last lipid  Requested Prescriptions  Pending Prescriptions Disp Refills   atorvastatin (LIPITOR) 80 MG tablet [Pharmacy Med Name: ATORVASTATIN CALCIUM 80 MG TAB] 90 tablet 1    Sig: TAKE 1 TABLET BY MOUTH AT BEDTIME      Cardiovascular:  Antilipid - Statins Failed - 09/27/2019  1:23 PM      Failed - Total Cholesterol in normal range and within 360 days    Cholesterol, Total  Date Value Ref Range Status  09/06/2017 167 100 - 199 mg/dL Final          Failed - LDL in normal range and within 360 days    LDL Calculated  Date Value Ref Range Status  09/06/2017 97 0 - 99 mg/dL Final          Failed - HDL in normal range and within 360 days    HDL  Date Value Ref Range Status  09/06/2017 52 >39 mg/dL Final          Failed - Triglycerides in normal range and within 360 days    Triglycerides  Date Value Ref Range Status  09/06/2017 91 0 - 149 mg/dL Final          Passed - Patient is not pregnant      Passed - Valid encounter within last 12 months    Recent Outpatient Visits           10 months ago BPPV (benign paroxysmal positional vertigo), bilateral   [place], DO   1 year ago Chronic bilateral low back pain with bilateral sciatica   [place], DO   1 year ago Primary osteoarthritis involving multiple joints   [place], Devonne Doughty, DO   1 year ago GAD (generalized anxiety disorder)   [facility] Olin Hauser, DO   1 year ago Acute swimmer's ear of right side   [facility] [place], Jeannette How, MD       Future Appointments             In 3 months [place], Gordan Payment [facility]                Requested Prescriptions  Pending Prescriptions Disp Refills   atorvastatin (LIPITOR) 80 MG tablet [Pharmacy Med Name: ATORVASTATIN CALCIUM 80 MG TAB] 90 tablet 1    Sig: TAKE 1 TABLET BY MOUTH AT BEDTIME      Cardiovascular:  Antilipid - Statins Failed - 09/27/2019  1:23 PM      Failed - Total Cholesterol in normal range and within 360 days    Cholesterol, Total  Date Value Ref Range Status  09/06/2017 167 100 - 199 mg/dL Final          Failed - LDL in normal range and within 360 days    LDL Calculated  Date Value Ref Range Status  09/06/2017 97 0 - 99 mg/dL Final          Failed - HDL in normal range and within 360 days    HDL  Date Value Ref Range Status  09/06/2017 52 >39 mg/dL Final  Failed - Triglycerides in normal range and within 360 days    Triglycerides  Date Value Ref Range Status  09/06/2017 91 0 - 149 mg/dL Final          Passed - Patient is not pregnant      Passed - Valid encounter within last 12 months    Recent Outpatient Visits           10 months ago BPPV (benign paroxysmal positional vertigo), bilateral   [place], DO   1 year ago Chronic bilateral low back pain with bilateral sciatica   [place], DO   1 year ago Primary osteoarthritis involving multiple joints   [place], DO   1 year ago GAD (generalized anxiety disorder)   [place], DO   1 year ago Acute swimmer's ear of right side   [facility] Crissman, Jeannette How, MD       Future Appointments             In 3 months McGowan, Gordan Payment [facility]

## 2019-10-03 DIAGNOSIS — M5416 Radiculopathy, lumbar region: Secondary | ICD-10-CM | POA: Diagnosis not present

## 2019-10-03 DIAGNOSIS — M4726 Other spondylosis with radiculopathy, lumbar region: Secondary | ICD-10-CM | POA: Diagnosis not present

## 2019-10-03 DIAGNOSIS — R3915 Urgency of urination: Secondary | ICD-10-CM | POA: Diagnosis not present

## 2019-10-03 DIAGNOSIS — M48061 Spinal stenosis, lumbar region without neurogenic claudication: Secondary | ICD-10-CM | POA: Diagnosis not present

## 2019-10-04 ENCOUNTER — Other Ambulatory Visit: Payer: Self-pay | Admitting: Family Medicine

## 2019-10-04 DIAGNOSIS — E782 Mixed hyperlipidemia: Secondary | ICD-10-CM

## 2019-10-09 ENCOUNTER — Other Ambulatory Visit: Payer: Self-pay | Admitting: Family Medicine

## 2019-10-09 DIAGNOSIS — E782 Mixed hyperlipidemia: Secondary | ICD-10-CM

## 2019-10-09 MED ORDER — ATORVASTATIN CALCIUM 80 MG PO TABS: 80.0000 mg | ORAL_TABLET | Freq: Every day | ORAL | 0 refills | Status: DC

## 2019-10-09 NOTE — Telephone Encounter (Signed)
TC to patient. Scheduled OV for medication refills/possible labs. Provided courtesy refill #30 days at this time. Last labs 09/06/17. Last OV 03/21/18. Future OV 10/11/19.

## 2019-10-09 NOTE — Telephone Encounter (Signed)
Patient requesting atorvastatin (LIPITOR) 80 MG tablet and states he is upset because the pharmacy advised him he would need his labs check prior to receiving a refill.     Patient states he does not take medication for high cholesterol but because he has a brain aneurysm. Patient states he would like a follow up call. Please advise   [place], [place]. Phone:  [phone]  Fax:  [phone]

## 2019-10-11 ENCOUNTER — Ambulatory Visit: Payer: Medicare Other | Admitting: Family Medicine

## 2019-10-12 ENCOUNTER — Other Ambulatory Visit: Payer: Self-pay | Admitting: Family Medicine

## 2019-10-14 ENCOUNTER — Other Ambulatory Visit: Payer: Self-pay

## 2019-10-14 ENCOUNTER — Encounter: Payer: Self-pay | Admitting: Family Medicine

## 2019-10-14 ENCOUNTER — Ambulatory Visit (INDEPENDENT_AMBULATORY_CARE_PROVIDER_SITE_OTHER): Payer: Medicare Other | Admitting: Family Medicine

## 2019-10-14 VITALS — BP 109/83 | HR 61 | Temp 97.3°F | Resp 16 | Ht 69.0 in | Wt 198.0 lb

## 2019-10-14 DIAGNOSIS — N050 Unspecified nephritic syndrome with minor glomerular abnormality: Secondary | ICD-10-CM

## 2019-10-14 DIAGNOSIS — F338 Other recurrent depressive disorders: Secondary | ICD-10-CM | POA: Diagnosis not present

## 2019-10-14 DIAGNOSIS — F411 Generalized anxiety disorder: Secondary | ICD-10-CM | POA: Diagnosis not present

## 2019-10-14 DIAGNOSIS — J3089 Other allergic rhinitis: Secondary | ICD-10-CM

## 2019-10-14 DIAGNOSIS — N401 Enlarged prostate with lower urinary tract symptoms: Secondary | ICD-10-CM

## 2019-10-14 DIAGNOSIS — G894 Chronic pain syndrome: Secondary | ICD-10-CM | POA: Diagnosis not present

## 2019-10-14 DIAGNOSIS — I471 Supraventricular tachycardia, unspecified: Secondary | ICD-10-CM

## 2019-10-14 DIAGNOSIS — E782 Mixed hyperlipidemia: Secondary | ICD-10-CM | POA: Diagnosis not present

## 2019-10-14 DIAGNOSIS — R7309 Other abnormal glucose: Secondary | ICD-10-CM | POA: Diagnosis not present

## 2019-10-14 DIAGNOSIS — M8949 Other hypertrophic osteoarthropathy, multiple sites: Secondary | ICD-10-CM | POA: Diagnosis not present

## 2019-10-14 DIAGNOSIS — M159 Polyosteoarthritis, unspecified: Secondary | ICD-10-CM

## 2019-10-14 DIAGNOSIS — N138 Other obstructive and reflux uropathy: Secondary | ICD-10-CM

## 2019-10-14 DIAGNOSIS — M150 Primary generalized (osteo)arthritis: Secondary | ICD-10-CM

## 2019-10-14 MED ORDER — MONTELUKAST SODIUM 10 MG PO TABS: 10.0000 mg | ORAL_TABLET | Freq: Every day | ORAL | 3 refills | Status: DC

## 2019-10-14 MED ORDER — ATORVASTATIN CALCIUM 80 MG PO TABS: 80.0000 mg | ORAL_TABLET | Freq: Every day | ORAL | 3 refills | Status: DC

## 2019-10-14 MED ORDER — METOPROLOL SUCCINATE ER 25 MG PO TB24: 25.0000 mg | ORAL_TABLET | Freq: Every day | ORAL | 3 refills | Status: AC

## 2019-10-14 MED ORDER — VENLAFAXINE HCL ER 75 MG PO CP24: 75.0000 mg | ORAL_CAPSULE | Freq: Every day | ORAL | 3 refills | Status: DC

## 2019-10-14 NOTE — Assessment & Plan Note (Signed)
Followed by Urology 

## 2019-10-14 NOTE — Assessment & Plan Note (Signed)
Stable No longer followed by [facility]

## 2019-10-14 NOTE — Assessment & Plan Note (Signed)
Followed by [facility] and [facility] Epidural injections On treatment Future joint replacement Hip

## 2019-10-14 NOTE — Patient Instructions (Addendum)
Thank you for coming to the office today.  Refilled venlafaxine, metoprolol, montelukast, atorvastatin  Due vaccines: - Flu Vaccine age 69+ once it is available, if done at pharmacy, let me know the date. - Also due Pneumovax-23 (booster pneumonia vaccine) age 61+, one dose and that is all  [facility] about the following - 1. Let them know Myrbetriq was $400, not covered, and may need discount from the manufacturer or their approval to lower cost - if they can look into that. - 2. Alternative medicines for overactive bladder - Ditropan (Oxybutynin) or Detrol ( Tolterodine) these can be effective in lower doses hopefully but may cause drying symptoms, confusion, drowsy, dizzy, elevated heart rate, constipation etc.  Please schedule a Follow-up Appointment to: Return in about 1 year (around 10/13/2020) for [employer].  If you have any other questions or concerns, please feel free to call the office or send a message through [place]. You may also schedule an earlier appointment if necessary.  Additionally, you may be receiving a survey about your experience at our office within a few days to 1 week by e-mail or mail. We value your feedback.  Nobie Putnam, DO [place]

## 2019-10-14 NOTE — Assessment & Plan Note (Signed)
Clinically with persistent mild to moderate anxiety, seems more generalized and daily Improved on current medicine regimen Affects his resting and sleep at night some insomnia Failed Cymbalta in past  Plan Continue Buspar, Venlafaxine - Refilled Hydroxyzine for PRN use - primarily for insomnia - Follow-up future may adjust dose inc Venlafaxine may help central / nerve pain

## 2019-10-14 NOTE — Progress Notes (Signed)
Subjective:    Patient ID: Matthew Maldonado, male    DOB: July 12, 1950, 69 y.o.   MRN: [id number]  Matthew Maldonado is a 69 y.o. male presenting on 10/14/2019 for Hyperlipidemia   HPI   Additional update Back pain - improved sciatica, following - Dr Consuela Mimes epidural spinal injections [facility] Orthopedic Hip pain, future may warrant replacement Last spinal injection 06/2019  HYPERLIPIDEMIA: Due for cholesterol lab today - Currently taking Atorvastatin 80, tolerating well without side effects or myalgias Due for refill  Lifestyle Playing golf, sweating a lot with hot and humidity, he drank coffee, water, gatorade and didn't urinate all day that day.  Minimal Change Disorder History of [facility] following, he had issues with protein in urine. He would take high dose prednisone 60mg  daily for months, and seems to be in remission. Had issues for previous 10 years. Now he only follow up if flare up with [facility]  Anxiety / Mikki Santee Reports chronic history of some anxiety. No depression. See scores below. Improved on Buspar and Venlafaxine, hydroxyzine  Allergic Rhinitis He has seen [facility] (Dr Margaretha Sheffield) in past. He uses Flonase History of scuba diving, had issue in past with R ear pain after scuba dive, he had issue on that R side. He was dx with fluid behind R ear. And it has improved. He will return this week. - History of prior nasal fractures, he has 90% blockage on R side of nose, in future 2020 he will anticipate deviated septum repair.  CHRONIC HTN: Reports usually checks BP avg 110/70s on avg. Current Meds - Metoprolol XL 25mg  daily Reports good compliance, took meds today. Tolerating well, w/o complaints. Denies CP, dyspnea, HA, edema, dizziness / lightheadedness  HYPERLIPIDEMIA / Middle Cerebral Artery Stenosis - Reports no concerns. Last lipid panel 08/2017 - Currently taking Atorvastatin 80mg , tolerating well without side effects or myalgias - Taking Aspirin 81mg   daily Reports a few episodes in past with difficulty controlling R arm in past, had some neurological symptoms, he has seen Neurology had imaging MRA brain back in 2017, showed some stenosis, now he is on preventative regimen with statin and BB, aspirin. Has not had any issues.  Urologist rx Myrbetriq for OAB, but could not afford $400, he is asking for other med  Health Maintenance:  Due for 2nd pneumonia vaccine, after age 3+  - due pneumovax-23, had prior to 26   Depression screen PHQ 2/9 10/14/2019 11/07/2018 06/22/2018  Decreased Interest 0 2 0  Down, Depressed, Hopeless 0 2 0  PHQ - 2 Score 0 4 0  Altered sleeping 1 2 -  Tired, decreased energy 2 3 -  Change in appetite 1 2 -  Feeling bad or failure about yourself  0 1 -  Trouble concentrating 0 0 -  Moving slowly or fidgety/restless 0 0 -  Suicidal thoughts 0 0 -  PHQ-9 Score 4 12 -  Difficult doing work/chores Somewhat difficult Not difficult at all -  Some recent data might be hidden   GAD 7 : Generalized Anxiety Score 10/14/2019 11/07/2018 06/22/2018 02/19/2018  Nervous, Anxious, on Edge 0 3 0 1  Control/stop worrying 0 3 0 0  Worry too much - different things 0 3 0 1  Trouble relaxing 0 3 0 1  Restless 0 2 0 0  Easily annoyed or irritable 2 3 0 1  Afraid - awful might happen 1 2 0 0  Total GAD 7 Score 3 19 0 4  Anxiety Difficulty Not difficult at all Not difficult at all Not difficult at all Somewhat difficult     Past Medical History:  Diagnosis Date  . Anxiety   . Arthritis    hand, knees, hip  . 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   Marland Kitchen BPH (benign prostatic hyperplasia)   . GERD (gastroesophageal reflux disease)   . Insomnia   . Minimal change disease    no issues for last 4-5 yrs  . Nocturia   . PSVT (paroxysmal supraventricular tachycardia) ([place])   . TIA (transient ischemic attack)    [facility] - last MRI 2017 showing stenosed M1   Past Surgical  History:  Procedure Laterality Date  . COLONOSCOPY WITH PROPOFOL N/A 04/04/2019   Procedure: COLONOSCOPY WITH PROPOFOL;  Surgeon: Jonathon Bellows, MD;  Location[facility] ENDOSCOPY;  Service: Gastroenterology;  Laterality: N/A;  . EXCISION NASAL MASS Left 03/22/2018   Procedure: EXCISION nasopharyngeal lesion;  Surgeon: Margaretha Sheffield, MD;  Location: [place];  Service: ENT;  Laterality: Left;  . NASAL ENDOSCOPY Bilateral 03/22/2018   Procedure: NASAL ENDOSCOPY NASOPHARYNGESCOPY;  Surgeon: Margaretha Sheffield, MD;  Location: [place];  Service: ENT;  Laterality: Bilateral;  . NASAL SEPTOPLASTY W/ TURBINOPLASTY Bilateral 03/22/2018   Procedure: NASAL SEPTOPLASTY WITH INFERIORTURBINATE REDUCTION;  Surgeon: Margaretha Sheffield, MD;  Location: [place];  Service: ENT;  Laterality: Bilateral;  . NASOPHARYNGOSCOPY EUSTATION TUBE BALLOON DILATION Bilateral 03/22/2018   Procedure: NASOPHARYNGOSCOPY EUSTATION TUBE BALLOON DILATION;  Surgeon: Margaretha Sheffield, MD;  Location: [place];  Service: ENT;  Laterality: Bilateral;  . RENAL BIOPSY     Social History   Socioeconomic History  . Marital status: Married    Spouse name: quein  . Number of children: 0  . Years of education: Not on file  . Highest education level: Some college, no degree  Occupational History    Comment: retired  Tobacco Use  . Smoking status: Former Smoker    Years: 4.00    Types: Cigars  . Smokeless tobacco: Former Systems developer  . Tobacco comment: 1 or 2 a day   Vaping Use  . Vaping Use: Never used  Substance and Sexual Activity  . Alcohol use: Yes    Alcohol/week: 18.0 standard drinks    Types: 1 Cans of beer, 14 Shots of liquor, 3 Standard drinks or equivalent per week  . Drug use: No  . Sexual activity: Not Currently  Other Topics Concern  . Not on file  Social History Narrative  . Not on file   Social Determinants of Health   Financial Resource Strain:   . Difficulty of Paying Living Expenses:   Food  Insecurity:   . Worried About Charity fundraiser in the Last Year:   . Arboriculturist in the Last Year:   Transportation Needs:   . Film/video editor (Medical):   Marland Kitchen Lack of Transportation (Non-Medical):   Physical Activity:   . Days of Exercise per Week:   . Minutes of Exercise per Session:   Stress:   . Feeling of Stress :   Social Connections:   . Frequency of Communication with Friends and Family:   . Frequency of Social Gatherings with Friends and Family:   . Attends Religious Services:   . Active Member of Clubs or Organizations:   . Attends Archivist Meetings:   Marland Kitchen Marital Status:   Intimate  Partner Violence:   . Fear of Current or Ex-Partner:   . Emotionally Abused:   Marland Kitchen Physically Abused:   . Sexually Abused:    Family History  Problem Relation Age of Onset  . Osteoporosis Mother   . Heart disease Father   . Heart disease Brother   . Alcohol abuse Brother   . Kidney disease Neg Hx   . Prostate cancer Neg Hx    Current Outpatient Medications on File Prior to Visit  Medication Sig  . busPIRone (BUSPAR) 15 MG tablet TAKE 1 TABLET BY MOUTH TWICE DAILY  . cyclobenzaprine (FLEXERIL) 10 MG tablet Take 1 tablet (10 mg total) by mouth at bedtime as needed for muscle spasms.  . Diclofenac Sodium 3 % GEL Place 4 g onto the skin 4 (four) times daily.  Marland Kitchen esomeprazole (NEXIUM) 20 MG capsule TAKE 1 CAPSULE BY MOUTH ONCE DAILY AT 12NOON.  . fluticasone (FLONASE) 50 MCG/ACT nasal spray Place 2 sprays into both nostrils daily.  Marland Kitchen ketoconazole (NIZORAL) 2 % shampoo APPLY TOPICALLY AS NEEDED IRRITATION TO SCALP 1-2 TIMES A WEEK  . Multiple Vitamins-Minerals (MULTIVITAMIN WITH MINERALS) tablet Take 1 tablet by mouth daily.  Marland Kitchen oxyCODONE (OXY IR/ROXICODONE) 5 MG immediate release tablet Take 1-2 tablets (5-10 mg total) by mouth at bedtime as needed for severe pain. Must last 30 days.  Derrill Memo ON 10/23/2019] oxyCODONE (OXY IR/ROXICODONE) 5 MG immediate release tablet Take  1-2 tablets (5-10 mg total) by mouth at bedtime as needed for severe pain. Must last 30 days.  . pregabalin (LYRICA) 50 MG capsule Take 3 capsules (150 mg total) by mouth at bedtime.  . mirabegron ER (MYRBETRIQ) 25 MG TB24 tablet Take 1 tablet (25 mg total) by mouth daily. (Patient not taking: Reported on 10/14/2019)  . oxyCODONE (OXY IR/ROXICODONE) 5 MG immediate release tablet Take 1-2 tablets (5-10 mg total) by mouth at bedtime as needed for severe pain. Must last 30 days.   No current facility-administered medications on file prior to visit.    Review of Systems Per HPI unless specifically indicated above      Objective:    BP 109/83   Pulse 61   Temp (!) 97.3 F (36.3 C) (Temporal)   Resp 16   Ht 5\' 9"  (1.753 m)   Wt 198 lb (89.8 kg)   SpO2 98%   BMI 29.24 kg/m   Wt Readings from Last 3 Encounters:  10/14/19 198 lb (89.8 kg)  07/09/19 193 lb (87.5 kg)  06/06/19 200 lb (90.7 kg)    Physical Exam Vitals and nursing note reviewed.  Constitutional:      General: He is not in acute distress.    Appearance: He is well-developed. He is not diaphoretic.     Comments: Well-appearing, comfortable, cooperative  HENT:     Head: Normocephalic and atraumatic.  Eyes:     General:        Right eye: No discharge.        Left eye: No discharge.     Conjunctiva/sclera: Conjunctivae normal.     Pupils: Pupils are equal, round, and reactive to light.  Neck:     Thyroid: No thyromegaly.  Cardiovascular:     Rate and Rhythm: Normal rate and regular rhythm.     Heart sounds: Normal heart sounds. No murmur heard.   Pulmonary:     Effort: Pulmonary effort is normal. No respiratory distress.     Breath sounds: Normal breath sounds. No wheezing or rales.  Abdominal:     General: Bowel sounds are normal. There is no distension.     Palpations: Abdomen is soft. There is no mass.     Tenderness: There is no abdominal tenderness.  Musculoskeletal:        General: No tenderness. Normal  range of motion.     Cervical back: Normal range of motion and neck supple.     Right lower leg: No edema.     Left lower leg: No edema.     Comments: Upper / Lower Extremities: - Normal muscle tone, strength bilateral upper extremities 5/5, lower extremities 5/5  Lymphadenopathy:     Cervical: No cervical adenopathy.  Skin:    General: Skin is warm and dry.     Findings: No erythema or rash.  Neurological:     Mental Status: He is alert and oriented to person, place, and time.     Comments: Distal sensation intact to light touch all extremities  Psychiatric:        Behavior: Behavior normal.     Comments: Well groomed, good eye contact, normal speech and thoughts    Results for orders placed or performed in visit on 07/29/19  ToxASSURE Select 13 (MW), Urine  Result Value Ref Range   Summary Note       Assessment & Plan:   Problem List Items Addressed This Visit    Seasonal affective disorder ([place])    Clinically with SAD secondary to variety of factors, pain, anxiety See A&P for anxiety  Continue current SNRI venlafaxine and Buspar for anxiety Follow-up      Relevant Medications   venlafaxine XR (EFFEXOR-XR) 75 MG 24 hr capsule   Paroxysmal supraventricular tachycardia (HCC)    Stable, chronic problem History of episodes of Paroxysmal SVT On BB metoprolol, low dose Follow-up      Relevant Medications   atorvastatin (LIPITOR) 80 MG tablet   metoprolol succinate (TOPROL-XL) 25 MG 24 hr tablet   Osteoarthritis involving multiple joints (Chronic)    Followed by [facility] Epidural injections On treatment Future joint replacement Hip      Relevant Orders   CBC with Differential/Platelet   COMPLETE METABOLIC PANEL WITH GFR   Minimal change disease    Stable No longer followed by [facility]      Relevant Orders   CBC with Differential/Platelet   COMPLETE METABOLIC PANEL WITH GFR   Hyperlipidemia - Primary    Due for lipids Refilled atorva 80       Relevant Medications   atorvastatin (LIPITOR) 80 MG tablet   metoprolol succinate (TOPROL-XL) 25 MG 24 hr tablet   Other Relevant Orders   Lipid panel   TSH   GAD (generalized anxiety disorder)    Clinically with persistent mild to moderate anxiety, seems more generalized and daily Improved on current medicine regimen Affects his resting and sleep at night some insomnia Failed Cymbalta in past  Plan Continue Buspar, Venlafaxine - Refilled Hydroxyzine for PRN use - primarily for insomnia - Follow-up future may adjust dose inc Venlafaxine may help central / nerve pain      Relevant Medications   venlafaxine XR (EFFEXOR-XR) 75 MG 24 hr capsule   Other Relevant Orders   COMPLETE METABOLIC PANEL WITH GFR   Chronic pain syndrome (Chronic)   Relevant Medications   venlafaxine XR (EFFEXOR-XR) 75 MG 24 hr capsule   BPH with obstruction/lower urinary tract symptoms    Followed by Urology  Relevant Orders   PSA    Other Visit Diagnoses    Abnormal glucose       Relevant Orders   Hemoglobin A1c   Seasonal allergic rhinitis due to other allergic trigger       Relevant Medications   montelukast (SINGULAIR) 10 MG tablet      Updated Health Maintenance information - due pneumovax23 and flu in future, return Labs ordered Encouraged improvement to lifestyle with diet and exercise - Goal of weight loss   Meds ordered this encounter  Medications  . atorvastatin (LIPITOR) 80 MG tablet    Sig: Take 1 tablet (80 mg total) by mouth at bedtime.    Dispense:  90 tablet    Refill:  3  . montelukast (SINGULAIR) 10 MG tablet    Sig: Take 1 tablet (10 mg total) by mouth at bedtime.    Dispense:  90 tablet    Refill:  3  . venlafaxine XR (EFFEXOR-XR) 75 MG 24 hr capsule    Sig: Take 1 capsule (75 mg total) by mouth daily.    Dispense:  90 capsule    Refill:  3  . metoprolol succinate (TOPROL-XL) 25 MG 24 hr tablet    Sig: Take 1 tablet (25 mg total) by mouth daily.     Dispense:  90 tablet    Refill:  3      Follow up plan: Return in about 1 year (around 10/13/2020) for Yearly Medicare Checkup.  Nobie Putnam, [facility] 10/14/2019, 10:10 AM

## 2019-10-14 NOTE — Assessment & Plan Note (Signed)
Due for lipids Refilled atorva 80

## 2019-10-14 NOTE — Assessment & Plan Note (Signed)
Clinically with SAD secondary to variety of factors, pain, anxiety See A&P for anxiety  Continue current SNRI venlafaxine and Buspar for anxiety Follow-up

## 2019-10-14 NOTE — Assessment & Plan Note (Signed)
Stable, chronic problem History of episodes of Paroxysmal SVT On BB metoprolol, low dose Follow-up

## 2019-10-15 LAB — COMPLETE METABOLIC PANEL WITH GFR
AG Ratio: 2 (calc) (ref 1.0–2.5)
ALT: 25 U/L (ref 9–46)
AST: 23 U/L (ref 10–35)
Albumin: 4.4 g/dL (ref 3.6–5.1)
Alkaline phosphatase (APISO): 64 U/L (ref 35–144)
BUN/Creatinine Ratio: 28 (calc) — ABNORMAL HIGH (ref 6–22)
BUN: 26 mg/dL — ABNORMAL HIGH (ref 7–25)
CO2: 27 mmol/L (ref 20–32)
Calcium: 9.4 mg/dL (ref 8.6–10.3)
Chloride: 106 mmol/L (ref 98–110)
Creat: 0.94 mg/dL (ref 0.70–1.25)
GFR, Est African American: 96 mL/min/{1.73_m2} (ref >=60)
GFR, Est Non African American: 83 mL/min/{1.73_m2} (ref >=60)
Globulin: 2.2 g/dL (calc) (ref 1.9–3.7)
Glucose, Bld: 100 mg/dL — ABNORMAL HIGH (ref 65–99)
Potassium: 4.7 mmol/L (ref 3.5–5.3)
Sodium: 141 mmol/L (ref 135–146)
Total Bilirubin: 0.5 mg/dL (ref 0.2–1.2)
Total Protein: 6.6 g/dL (ref 6.1–8.1)

## 2019-10-15 LAB — CBC WITH DIFFERENTIAL/PLATELET
Absolute Monocytes: 626 cells/uL (ref 200–950)
Basophils Absolute: 22 cells/uL (ref 0–200)
Basophils Relative: 0.4 %
Eosinophils Absolute: 103 cells/uL (ref 15–500)
Eosinophils Relative: 1.9 %
HCT: 45 % (ref 38.5–50.0)
Hemoglobin: 15 g/dL (ref 13.2–17.1)
Lymphs Abs: 1976 cells/uL (ref 850–3900)
MCH: 32.7 pg (ref 27.0–33.0)
MCHC: 33.3 g/dL (ref 32.0–36.0)
MCV: 98 fL (ref 80.0–100.0)
MPV: 9.4 fL (ref 7.5–12.5)
Monocytes Relative: 11.6 %
Neutro Abs: 2673 cells/uL (ref 1500–7800)
Neutrophils Relative %: 49.5 %
Platelets: 214 10*3/uL (ref 140–400)
RBC: 4.59 10*6/uL (ref 4.20–5.80)
RDW: 12.6 % (ref 11.0–15.0)
Total Lymphocyte: 36.6 %
WBC: 5.4 10*3/uL (ref 3.8–10.8)

## 2019-10-15 LAB — LIPID PANEL
Cholesterol: 147 mg/dL (ref <200)
HDL: 59 mg/dL (ref >=40)
LDL Cholesterol (Calc): 73 mg/dL (calc)
Non-HDL Cholesterol (Calc): 88 mg/dL (calc) (ref <130)
Total CHOL/HDL Ratio: 2.5 (calc) (ref <5.0)
Triglycerides: 70 mg/dL (ref <150)

## 2019-10-15 LAB — HEMOGLOBIN A1C
Hgb A1c MFr Bld: 5.3 % of total Hgb (ref <5.7)
Mean Plasma Glucose: 105 (calc)
eAG (mmol/L): 5.8 (calc)

## 2019-10-15 LAB — TSH: TSH: 1.26 mIU/L (ref 0.40–4.50)

## 2019-10-15 LAB — PSA: PSA: 0.7 ng/mL (ref <=4.0)

## 2019-10-22 DIAGNOSIS — D485 Neoplasm of uncertain behavior of skin: Secondary | ICD-10-CM | POA: Diagnosis not present

## 2019-10-22 DIAGNOSIS — D0472 Carcinoma in situ of skin of left lower limb, including hip: Secondary | ICD-10-CM | POA: Diagnosis not present

## 2019-10-22 DIAGNOSIS — L821 Other seborrheic keratosis: Secondary | ICD-10-CM | POA: Diagnosis not present

## 2019-10-22 DIAGNOSIS — L570 Actinic keratosis: Secondary | ICD-10-CM | POA: Diagnosis not present

## 2019-10-25 NOTE — Progress Notes (Signed)
This patient's chart is under "My Open Charts". These are cancelled appointments that keep popping into my "In Basket" as a deficiency. See what you can do to remove them.   Thank you. 

## 2019-10-28 ENCOUNTER — Telehealth: Payer: Self-pay | Admitting: Urology

## 2019-10-28 NOTE — Telephone Encounter (Signed)
Pt called office stating that the Rx for mybetriq would cost him $400. Pt asks is there something else he can take that would be cheaper. Please advise.

## 2019-10-31 ENCOUNTER — Other Ambulatory Visit: Payer: Self-pay | Admitting: Urology

## 2019-10-31 ENCOUNTER — Other Ambulatory Visit: Payer: Self-pay | Admitting: Family Medicine

## 2019-10-31 DIAGNOSIS — F338 Other recurrent depressive disorders: Secondary | ICD-10-CM

## 2019-10-31 DIAGNOSIS — F411 Generalized anxiety disorder: Secondary | ICD-10-CM

## 2019-10-31 NOTE — Progress Notes (Signed)
Please have Matthew Maldonado come in for an UA to check for micro heme.  I have seen some UA dips that were positive for blood.  We can also give him some Gemtesa samples to see if they will help.   LMOM asking for patient to return call and schedule a lab appointment to leave a urine sample. Also stated he may request the [place] sample when he comes in so we can give them to him.   Patient came to office yesterday and left a urine.

## 2019-10-31 NOTE — Telephone Encounter (Signed)
Requested Prescriptions  Pending Prescriptions Disp Refills   busPIRone (BUSPAR) 15 MG tablet [Pharmacy Med Name: BUSPIRONE HCL 15 MG TAB] 180 tablet 0    Sig: TAKE 1 TABLET BY MOUTH TWICE DAILY     Psychiatry: Anxiolytics/Hypnotics - Non-controlled Passed - 10/31/2019  1:38 PM      Passed - Valid encounter within last 6 months    Recent Outpatient Visits          2 weeks ago Mixed hyperlipidemia   [place], DO   11 months ago BPPV (benign paroxysmal positional vertigo), bilateral   [facility] [place], DO   1 year ago Chronic bilateral low back pain with bilateral sciatica   [place], DO   1 year ago Primary osteoarthritis involving multiple joints   [facility] [place], Devonne Doughty, DO   1 year ago GAD (generalized anxiety disorder)   [place], Devonne Doughty, DO      Future Appointments            In 1 week Gillis Santa, MD [place]   In 1 month McGowan, Gordan Payment [facility]

## 2019-11-05 ENCOUNTER — Other Ambulatory Visit: Payer: Self-pay | Admitting: Urology

## 2019-11-05 ENCOUNTER — Other Ambulatory Visit: Payer: Self-pay

## 2019-11-05 ENCOUNTER — Other Ambulatory Visit: Payer: Medicare Other

## 2019-11-05 DIAGNOSIS — R3129 Other microscopic hematuria: Secondary | ICD-10-CM

## 2019-11-05 DIAGNOSIS — N401 Enlarged prostate with lower urinary tract symptoms: Secondary | ICD-10-CM

## 2019-11-05 DIAGNOSIS — N138 Other obstructive and reflux uropathy: Secondary | ICD-10-CM | POA: Diagnosis not present

## 2019-11-05 MED ORDER — GEMTESA 75 MG PO TABS: 75.0000 mg | ORAL_TABLET | Freq: Every day | ORAL | 0 refills | Status: DC

## 2019-11-05 NOTE — Progress Notes (Signed)
Notified patient as instructed, patient pleased. Discussed follow-up appointments, patient agrees  

## 2019-11-05 NOTE — Progress Notes (Signed)
Gemtesa samples given

## 2019-11-05 NOTE — Addendum Note (Signed)
Addended by: Maryln Gottron on: 11/05/2019 03:08 PM   Modules accepted: Orders

## 2019-11-05 NOTE — Progress Notes (Signed)
Please let Matthew Maldonado know that his urine sample today had some microscopic blood.  I have sent it for culture.  If it comes back positive for infection, we will need to treat with appropriate antibiotics and recheck the urine to make sure the microscopic hematuria clears with the treating of the infection.  If the urine culture returns negative for infection, we will need to pursue a hematuria work-up which consists of a CT scan with dye and a cystoscopy.

## 2019-11-06 LAB — URINALYSIS, COMPLETE
Bilirubin, UA: NEGATIVE
Glucose, UA: NEGATIVE
Ketones, UA: NEGATIVE
Leukocytes,UA: NEGATIVE
Nitrite, UA: NEGATIVE
Protein,UA: NEGATIVE
Specific Gravity, UA: 1.02 (ref 1.005–1.030)
Urobilinogen, Ur: 1 mg/dL (ref 0.2–1.0)
pH, UA: 7 (ref 5.0–7.5)

## 2019-11-06 LAB — MICROSCOPIC EXAMINATION

## 2019-11-08 ENCOUNTER — Telehealth: Payer: Self-pay

## 2019-11-08 NOTE — Telephone Encounter (Signed)
Attemtped to call patient to call us back regarding pre virtual appointment questions. Unable to leave message.  Needed an access number to leave a message.

## 2019-11-11 ENCOUNTER — Other Ambulatory Visit: Payer: Self-pay

## 2019-11-11 ENCOUNTER — Encounter: Payer: Self-pay | Admitting: Student in an Organized Health Care Education/Training Program

## 2019-11-11 ENCOUNTER — Ambulatory Visit
Payer: Medicare Other | Attending: Pain Medicine | Admitting: Student in an Organized Health Care Education/Training Program

## 2019-11-11 DIAGNOSIS — G894 Chronic pain syndrome: Secondary | ICD-10-CM

## 2019-11-11 NOTE — Progress Notes (Signed)
I attempted to call the patient however no response. Voicemail left instructing patient to call front desk office at [phone] to reschedule appointment. -Dr Danisa Kopec  

## 2019-11-13 ENCOUNTER — Ambulatory Visit: Payer: Medicare Other | Admitting: Pain Medicine

## 2019-11-28 ENCOUNTER — Other Ambulatory Visit: Payer: Self-pay | Admitting: Pain Medicine

## 2019-11-28 ENCOUNTER — Telehealth: Payer: Self-pay | Admitting: Pain Medicine

## 2019-11-28 ENCOUNTER — Telehealth: Payer: Self-pay | Admitting: Family Medicine

## 2019-11-28 ENCOUNTER — Other Ambulatory Visit: Payer: Self-pay | Admitting: Family Medicine

## 2019-11-28 DIAGNOSIS — F411 Generalized anxiety disorder: Secondary | ICD-10-CM

## 2019-11-28 DIAGNOSIS — F338 Other recurrent depressive disorders: Secondary | ICD-10-CM

## 2019-11-28 DIAGNOSIS — M792 Neuralgia and neuritis, unspecified: Secondary | ICD-10-CM

## 2019-11-28 MED ORDER — BUSPIRONE HCL 15 MG PO TABS: 15.0000 mg | ORAL_TABLET | Freq: Two times a day (BID) | ORAL | 0 refills | Status: DC

## 2019-11-28 NOTE — Telephone Encounter (Signed)
[facility] called to ask about getting Lyrica scripts refilled. Matthew Maldonado is at Hospital there with his wife. They would like to help him out. This is why he has not been able to be reached.  I did inform them that the patient has to have an appt. Before he could get a refill. They said they would let him know.  Please call this pharmacy and let them know status  [phone]

## 2019-11-28 NOTE — Telephone Encounter (Signed)
Patient's wife in the hospital so got a phone call from [place] a pharamcist that work in the [place] [facility] regarding verbal for  Rx for Lyrica and Buspar --informed that Dr Raliegh Ip did not refill Lyrica it was Dr [name] who refilled it in past and their phone number was given and also his Buspar was send to [facility] today ---so did not give any verbal but she will [place] for Rx transfer ?

## 2019-11-28 NOTE — Telephone Encounter (Signed)
To clarify, I sent a rx for Buspar to [place] while patient is there, I sent 60 pills 0 refills for 30 day supply, if he needs more he can notify us.  He should be able to pick up buspar from that pharmacy now.  Nobie Putnam, [facility] 11/28/2019, 3:56 PM

## 2019-11-28 NOTE — Telephone Encounter (Signed)
Requested Prescriptions  Pending Prescriptions Disp Refills  . busPIRone (BUSPAR) 15 MG tablet [Pharmacy Med Name: BUSPIRONE HCL 15 MG TAB] 180 tablet 1    Sig: TAKE 1 TABLET BY MOUTH TWICE DAILY     Psychiatry: Anxiolytics/Hypnotics - Non-controlled Passed - 11/28/2019  1:47 PM      Passed - Valid encounter within last 6 months    Recent Outpatient Visits          1 month ago Mixed hyperlipidemia   [place], DO   1 year ago BPPV (benign paroxysmal positional vertigo), bilateral   [place], DO   1 year ago Chronic bilateral low back pain with bilateral sciatica   [place], DO   1 year ago Primary osteoarthritis involving multiple joints   [place], DO   1 year ago GAD (generalized anxiety disorder)   [place], Devonne Doughty, DO      Future Appointments            In 4 weeks McGowan, Gordan Payment [facility]

## 2019-11-28 NOTE — Telephone Encounter (Signed)
Patient informed. 

## 2019-11-29 NOTE — Telephone Encounter (Signed)
Dr Holley Raring and Dossie Arbour are out of the office.  Would you be willing to handle this?

## 2019-11-29 NOTE — Telephone Encounter (Signed)
Patient states his wife had a heart attack in [place] and she is in the hospital there. Doesn't know when she can leave. He says the pharmacy below has agreed to fill his script if we will send it in. Lyrica

## 2019-12-03 ENCOUNTER — Telehealth: Payer: Self-pay | Admitting: *Deleted

## 2019-12-04 ENCOUNTER — Encounter: Payer: Self-pay | Admitting: Anesthesiology

## 2019-12-04 ENCOUNTER — Other Ambulatory Visit: Payer: Self-pay

## 2019-12-04 ENCOUNTER — Telehealth: Payer: Self-pay | Admitting: Pain Medicine

## 2019-12-04 ENCOUNTER — Telehealth: Payer: Self-pay | Admitting: *Deleted

## 2019-12-04 ENCOUNTER — Ambulatory Visit: Payer: Medicare Other | Attending: Anesthesiology | Admitting: Anesthesiology

## 2019-12-04 DIAGNOSIS — M25551 Pain in right hip: Secondary | ICD-10-CM | POA: Diagnosis not present

## 2019-12-04 DIAGNOSIS — M79605 Pain in left leg: Secondary | ICD-10-CM

## 2019-12-04 DIAGNOSIS — M8949 Other hypertrophic osteoarthropathy, multiple sites: Secondary | ICD-10-CM | POA: Diagnosis not present

## 2019-12-04 DIAGNOSIS — M160 Bilateral primary osteoarthritis of hip: Secondary | ICD-10-CM

## 2019-12-04 DIAGNOSIS — M5442 Lumbago with sciatica, left side: Secondary | ICD-10-CM

## 2019-12-04 DIAGNOSIS — M792 Neuralgia and neuritis, unspecified: Secondary | ICD-10-CM

## 2019-12-04 DIAGNOSIS — M79604 Pain in right leg: Secondary | ICD-10-CM | POA: Diagnosis not present

## 2019-12-04 DIAGNOSIS — G8929 Other chronic pain: Secondary | ICD-10-CM

## 2019-12-04 DIAGNOSIS — M5441 Lumbago with sciatica, right side: Secondary | ICD-10-CM

## 2019-12-04 DIAGNOSIS — M25552 Pain in left hip: Secondary | ICD-10-CM

## 2019-12-04 DIAGNOSIS — M7918 Myalgia, other site: Secondary | ICD-10-CM

## 2019-12-04 DIAGNOSIS — G894 Chronic pain syndrome: Secondary | ICD-10-CM | POA: Diagnosis not present

## 2019-12-04 DIAGNOSIS — Z79899 Other long term (current) drug therapy: Secondary | ICD-10-CM | POA: Diagnosis not present

## 2019-12-04 DIAGNOSIS — M5137 Other intervertebral disc degeneration, lumbosacral region: Secondary | ICD-10-CM | POA: Diagnosis not present

## 2019-12-04 DIAGNOSIS — M159 Polyosteoarthritis, unspecified: Secondary | ICD-10-CM

## 2019-12-04 DIAGNOSIS — M5416 Radiculopathy, lumbar region: Secondary | ICD-10-CM | POA: Diagnosis not present

## 2019-12-04 MED ORDER — CYCLOBENZAPRINE HCL 10 MG PO TABS: 10.0000 mg | ORAL_TABLET | Freq: Every evening | ORAL | 5 refills | Status: DC | PRN

## 2019-12-04 MED ORDER — PREGABALIN 50 MG PO CAPS: 150.0000 mg | ORAL_CAPSULE | Freq: Every day | ORAL | 2 refills | Status: DC

## 2019-12-04 NOTE — Telephone Encounter (Signed)
Patient called asking why he had to have an appointment for medication that is non narcotic. I explained that he has not had an appt since 07/2019, and appointments are required periodically. He seemed annoyed that he must have an appt. He said his wife had a heart attack while they were in [place], and they got home 2 days ago. He said he may not be able to answer the phone when Dr. Andree Elk calls because he may be at an appt with his wife.

## 2019-12-04 NOTE — Progress Notes (Signed)
Virtual Visit via Telephone Note  I connected with Matthew Maldonado on 12/04/19 at  2:15 PM EDT by telephone and verified that I am speaking with the correct person using two identifiers.  Location: Patient: Home Provider: Pain control center   I discussed the limitations, risks, security and privacy concerns of performing an evaluation and management service by telephone and the availability of in person appointments. I also discussed with the patient that there may be a patient responsible charge related to this service. The patient expressed understanding and agreed to proceed.   History of Present Illness: I spoke with [name] via telephone today as he was unable to do the video portion of the virtual conference.  He reports that he has been doing reasonably well with his pain syndrome and his low back pain primarily.  He is taking his pregabalin and Flexeril as prescribed and using sparing doses of opioids for breakthrough pain.  The quality characteristic distribution of his pain syndrome have been stable with no reported changes today.  He is tolerating the medications without difficulty as well.  Otherwise he is in his usual state of health.    Observations/Objective:   Current Outpatient Medications:  .  atorvastatin (LIPITOR) 80 MG tablet, Take 1 tablet (80 mg total) by mouth at bedtime., Disp: 90 tablet, Rfl: 3 .  busPIRone (BUSPAR) 15 MG tablet, Take 1 tablet (15 mg total) by mouth 2 (two) times daily., Disp: 60 tablet, Rfl: 0 .  cyclobenzaprine (FLEXERIL) 10 MG tablet, Take 1 tablet (10 mg total) by mouth at bedtime as needed for muscle spasms., Disp: 30 tablet, Rfl: 5 .  Diclofenac Sodium 3 % GEL, Place 4 g onto the skin 4 (four) times daily., Disp: 100 g, Rfl: 10 .  esomeprazole (NEXIUM) 20 MG capsule, TAKE 1 CAPSULE BY MOUTH ONCE DAILY AT 12NOON., Disp: 90 capsule, Rfl: 0 .  fluticasone (FLONASE) 50 MCG/ACT nasal spray, Place 2 sprays into both nostrils daily., Disp: 48 g, Rfl:  3 .  ketoconazole (NIZORAL) 2 % shampoo, APPLY TOPICALLY AS NEEDED IRRITATION TO SCALP 1-2 TIMES A WEEK, Disp: 120 mL, Rfl: 1 .  metoprolol succinate (TOPROL-XL) 25 MG 24 hr tablet, Take 1 tablet (25 mg total) by mouth daily., Disp: 90 tablet, Rfl: 3 .  montelukast (SINGULAIR) 10 MG tablet, Take 1 tablet (10 mg total) by mouth at bedtime., Disp: 90 tablet, Rfl: 3 .  Multiple Vitamins-Minerals (MULTIVITAMIN WITH MINERALS) tablet, Take 1 tablet by mouth daily., Disp: , Rfl:  .  oxyCODONE (OXY IR/ROXICODONE) 5 MG immediate release tablet, Take 1-2 tablets (5-10 mg total) by mouth at bedtime as needed for severe pain. Must last 30 days., Disp: 60 tablet, Rfl: 0 .  oxyCODONE (OXY IR/ROXICODONE) 5 MG immediate release tablet, Take 1-2 tablets (5-10 mg total) by mouth at bedtime as needed for severe pain. Must last 30 days., Disp: 60 tablet, Rfl: 0 .  oxyCODONE (OXY IR/ROXICODONE) 5 MG immediate release tablet, Take 1-2 tablets (5-10 mg total) by mouth at bedtime as needed for severe pain. Must last 30 days., Disp: 60 tablet, Rfl: 0 .  pregabalin (LYRICA) 50 MG capsule, Take 3 capsules (150 mg total) by mouth at bedtime., Disp: 90 capsule, Rfl: 2 .  venlafaxine XR (EFFEXOR-XR) 75 MG 24 hr capsule, Take 1 capsule (75 mg total) by mouth daily., Disp: 90 capsule, Rfl: 3 .  Vibegron (GEMTESA) 75 MG TABS, Take 75 mg by mouth daily., Disp: 28 tablet, Rfl: 0 Assessment and  Plan:  1. Chronic low back pain (Bilateral) w/ sciatica (Bilateral) (L>R)   2. Chronic pain syndrome   3. Chronic pain of lower extremity (Primary area of Pain) (Bilateral) (L>R)   4. Chronic hip pain (Tertiary Area of Pain) (Bilateral) (L>R)   5. Pharmacologic therapy   6. Chronic musculoskeletal pain   7. Osteoarthritis involving multiple joints   8. DDD (degenerative disc disease), lumbosacral   9. Chronic lumbar radiculopathy (Left)   10. Osteoarthritis of hips (Bilateral)   11. Neurogenic pain   Based on our discussion today and  upon review the [facility] practitioner database information going to refill his two medicines today to pregabalin and Flexeril.  He does not need his opioids refilled at this time.  Want him to continue with his current pain management protocol.  His stretching and strengthening with return to clinic in 3 months for follow-up with Dr. Dossie Arbour.  He is to continue follow-up with his primary care physicians as well.   Follow Up Instructions:    I discussed the assessment and treatment plan with the patient. The patient was provided an opportunity to ask questions and all were answered. The patient agreed with the plan and demonstrated an understanding of the instructions.   The patient was advised to call back or seek an in-person evaluation if the symptoms worsen or if the condition fails to improve as anticipated.  I provided 20 minutes of non-face-to-face time during this encounter.   Molli Barrows, MD

## 2019-12-04 NOTE — Telephone Encounter (Signed)
Left message with patient notifying him of an VV appt today to go over medications.

## 2019-12-04 NOTE — Telephone Encounter (Signed)
Pllese schedule a virtual appointment with Dr Andree Elk today.

## 2019-12-18 ENCOUNTER — Other Ambulatory Visit: Payer: Self-pay | Admitting: *Deleted

## 2019-12-18 DIAGNOSIS — R972 Elevated prostate specific antigen [PSA]: Secondary | ICD-10-CM

## 2019-12-19 ENCOUNTER — Other Ambulatory Visit: Payer: Self-pay

## 2019-12-19 ENCOUNTER — Other Ambulatory Visit: Payer: Medicare Other

## 2019-12-19 DIAGNOSIS — R972 Elevated prostate specific antigen [PSA]: Secondary | ICD-10-CM

## 2019-12-20 LAB — PSA: Prostate Specific Ag, Serum: 0.8 ng/mL (ref 0.0–4.0)

## 2019-12-25 NOTE — Progress Notes (Signed)
12/26/2019 11:17 AM   Matthew Maldonado 10-Aug-1950  [id number]  Referring provider: Olin Hauser, DO [address] [place],  [place] [address]  Chief Complaint  Patient presents with  . Benign Prostatic Hypertrophy   HPI: Matthew Maldonado is a 69 y.o. male with BPH with LU TS, nocturia and ED who presents today for an annual follow up.  BPH WITH LUTS  (prostate and/or bladder) IPSS score: 13/3   Previous score: 9/2  Previous PVR: 0 mL  Major complaint(s): Urgency and urge incontinence at times.    He discontinued the Flexeril and Singulair and his intermittency resolved.     Patient denies any modifying or aggravating factors.  Patient denies any gross hematuria, dysuria or suprapubic/flank pain.  Patient denies any fevers, chills, nausea or vomiting.   He does not have a family history of PCa.  UA today is benign.   IPSS    Row Name 12/26/19 1000         International Prostate Symptom Score   How often have you had the sensation of not emptying your bladder? Less than 1 in 5     How often have you had to urinate less than every two hours? About half the time     How often have you found you stopped and started again several times when you urinated? About half the time     How often have you found it difficult to postpone urination? About half the time     How often have you had a weak urinary stream? Less than 1 in 5 times     How often have you had to strain to start urination? Not at All     How many times did you typically get up at night to urinate? 2 Times     Total IPSS Score 13       Quality of Life due to urinary symptoms   If you were to spend the rest of your life with your urinary condition just the way it is now how would you feel about that? Mixed            Score:  1-7 Mild 8-19 Moderate 20-35 Severe   Erectile dysfunction His previous SHIM score was 15  He is currently not sexually active as his wife suffers a traumatic brain injury after a MVA.      High risk hematuria Smoker.   Patient had 3-10 RBCs on urinalysis in August 2021.  He has not yet undergone his hematuria work-up.  PMH: Past Medical History:  Diagnosis Date  . Anxiety   . Arthritis    hand, knees, hip  . 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   Marland Kitchen BPH (benign prostatic hyperplasia)   . GERD (gastroesophageal reflux disease)   . Insomnia   . Minimal change disease    no issues for last 4-5 yrs  . Nocturia   . PSVT (paroxysmal supraventricular tachycardia) ([place])   . TIA (transient ischemic attack)    Sees neurology at [facility] - last MRI 2017 showing stenosed M1    Surgical History: Past Surgical History:  Procedure Laterality Date  . COLONOSCOPY WITH PROPOFOL N/A 04/04/2019   Procedure: COLONOSCOPY WITH PROPOFOL;  Surgeon: Jonathon Bellows, MD;  Location: [facility] ENDOSCOPY;  Service: Gastroenterology;  Laterality: N/A;  . EXCISION NASAL MASS Left 03/22/2018   Procedure: EXCISION nasopharyngeal lesion;  Surgeon: Margaretha Sheffield, MD;  Location: [place];  Service: ENT;  Laterality: Left;  . NASAL ENDOSCOPY Bilateral 03/22/2018   Procedure: NASAL ENDOSCOPY NASOPHARYNGESCOPY;  Surgeon: Margaretha Sheffield, MD;  Location: [place];  Service: ENT;  Laterality: Bilateral;  . NASAL SEPTOPLASTY W/ TURBINOPLASTY Bilateral 03/22/2018   Procedure: NASAL SEPTOPLASTY WITH INFERIORTURBINATE REDUCTION;  Surgeon: Margaretha Sheffield, MD;  Location: [place];  Service: ENT;  Laterality: Bilateral;  . NASOPHARYNGOSCOPY EUSTATION TUBE BALLOON DILATION Bilateral 03/22/2018   Procedure: NASOPHARYNGOSCOPY EUSTATION TUBE BALLOON DILATION;  Surgeon: Margaretha Sheffield, MD;  Location: [place];  Service: ENT;  Laterality: Bilateral;  . RENAL BIOPSY      Home Medications:  Allergies as of 12/26/2019   No Known Allergies     Medication List       Accurate as of December 26, 2019 11:17 AM. If you have any questions, ask your nurse  or doctor.        STOP taking these medications   cyclobenzaprine 10 MG tablet Commonly known as: FLEXERIL Stopped by: Yazan Gatling, PA-C   Gemtesa 75 MG Tabs Generic drug: Vibegron Stopped by: Karmyn Lowman, PA-C   montelukast 10 MG tablet Commonly known as: SINGULAIR Stopped by: [name], PA-C     TAKE these medications   atorvastatin 80 MG tablet Commonly known as: LIPITOR Take 1 tablet (80 mg total) by mouth at bedtime.   busPIRone 15 MG tablet Commonly known as: BUSPAR Take 1 tablet (15 mg total) by mouth 2 (two) times daily.   Diclofenac Sodium 3 % Gel Place 4 g onto the skin 4 (four) times daily.   esomeprazole 20 MG capsule Commonly known as: NEXIUM TAKE 1 CAPSULE BY MOUTH ONCE DAILY AT 12NOON.   fluticasone 50 MCG/ACT nasal spray Commonly known as: FLONASE Place 2 sprays into both nostrils daily.   ketoconazole 2 % shampoo Commonly known as: NIZORAL APPLY TOPICALLY AS NEEDED IRRITATION TO SCALP 1-2 TIMES A WEEK   metoprolol succinate 25 MG 24 hr tablet Commonly known as: TOPROL-XL Take 1 tablet (25 mg total) by mouth daily.   multivitamin with minerals tablet Take 1 tablet by mouth daily.   oxyCODONE 5 MG immediate release tablet Commonly known as: Oxy IR/ROXICODONE Take 1-2 tablets (5-10 mg total) by mouth at bedtime as needed for severe pain. Must last 30 days. What changed: Another medication with the same name was removed. Continue taking this medication, and follow the directions you see here. Changed by: [name], PA-C   pregabalin 50 MG capsule Commonly known as: Lyrica Take 3 capsules (150 mg total) by mouth at bedtime. What changed: when to take this   venlafaxine XR 75 MG 24 hr capsule Commonly known as: EFFEXOR-XR Take 1 capsule (75 mg total) by mouth daily.       Allergies: No Known Allergies  Family History: Family History  Problem Relation Age of Onset  . Osteoporosis Mother   . Heart disease Father    . Heart disease Brother   . Alcohol abuse Brother   . Kidney disease Neg Hx   . Prostate cancer Neg Hx     Social History:  reports that he has quit smoking. His smoking use included cigars. He quit after 4.00 years of use. He has quit using smokeless tobacco. He reports current alcohol use of about 18.0 standard drinks of alcohol per week. He reports that he does not use drugs.  ROS:  For pertinent review of systems please refer to history of present illness  Physical Exam: BP 132/86   Pulse 80   Wt 196 lb (88.9 kg)   BMI 28.94 kg/m   Constitutional:  Well nourished. Alert and oriented, No acute distress. HEENT: [place] AT, mask in place.  Trachea midline Cardiovascular: No clubbing, cyanosis, or edema. Respiratory: Normal respiratory effort, no increased work of breathing. GU: No CVA tenderness.  No bladder fullness or masses.  Patient with circumcised phallus.  Urethral meatus is patent.  No penile discharge. No penile lesions or rashes. Scrotum without lesions, cysts, rashes and/or edema.  Testicles are located scrotally bilaterally. No masses are appreciated in the testicles. Left and right epididymis are normal. Rectal: Patient with  normal sphincter tone. Anus and perineum without scarring or rashes. No rectal masses are appreciated. Prostate is approximately 50 grams, no nodules are appreciated. Seminal vesicles could not be palpated Skin: No rashes, bruises or suspicious lesions. Lymph: No inguinal adenopathy. Neurologic: Grossly intact, no focal deficits, moving all 4 extremities. Psychiatric: Normal mood and affect.  Laboratory Data: Lab Results  Component Value Date   WBC 5.4 10/14/2019   HGB 15.0 10/14/2019   HCT 45.0 10/14/2019   MCV 98.0 10/14/2019   PLT 214 10/14/2019    Lab Results  Component Value Date   CREATININE 0.94 10/14/2019   Component     Latest Ref Rng & Units 01/18/2016 01/31/2017 12/26/2018 12/19/2019  Prostate Specific Ag, Serum     0.0 - 4.0  ng/mL 1.1 1.1 1.0 0.8   Component     Latest Ref Rng & Units 11/05/2019  Specific Gravity, UA     1.005 - 1.030 1.020  pH, UA     5.0 - 7.5 7.0  Color, UA     Yellow Yellow  Appearance Ur     Clear Clear  Leukocytes,UA     Negative Negative  Protein,UA     Negative/Trace Negative  Glucose, UA     Negative Negative  Ketones, UA     Negative Negative  RBC, UA     Negative Trace (A)  Bilirubin, UA     Negative Negative  Urobilinogen, Ur     0.2 - 1.0 mg/dL 1.0  Nitrite, UA     Negative Negative  Microscopic Examination      See below:   Component     Latest Ref Rng & Units 11/05/2019  WBC, UA     0 - 5 /hpf 0-5  RBC     0 - 2 /hpf 3-10 (A)  Epithelial Cells (non renal)     0 - 10 /hpf 0-10  Bacteria, UA     None seen/Few Few  I have reviewed the labs.    Pertinent Imaging: No recent imaging  Assessment & Plan:    1. BPH with LUTS IPSS score is 13/3, it is slightly worse Continue conservative management, avoiding bladder irritants and timed voiding's Most bothersome symptoms is/are occasional urgency and urge incontinence RTC in 12 months for IPSS, PSA, PVR and exam   2. Microscopic hematuria - I explained to the patient that there are a number of causes that can be associated with blood in the urine, such as stones, BPH, UTI's, damage to the urinary tract and/or cancer. - At this time, he meets high risk criteria and CT urogram is the preferred imaging study to evaluate hematuria. - I explained to the patient that a contrast material will be injected into a vein and that in rare instances, an allergic reaction can result and may  even life threatening (1:100,000)  The patient denies any allergies to contrast, iodine and/or seafood and is not taking metformin - Following the imaging study,  I've recommended a cystoscopy. I described how this is performed, typically in an office setting with a flexible cystoscope. We described the risks, benefits, and possible side  effects, the most common of which is a minor amount of blood in the urine and/or burning which usually resolves in 24 to 48 hours.   - The patient had the opportunity to ask questions which were answered. Based upon this discussion, the patient is willing to proceed. Therefore, I've ordered: a CT Urogram and cystoscopy. - The patient will return following all of the above for discussion of the results.  - UA - Urine culture    3. Rectal mass Evaluated by GI - internal hemorrhoid and benign polyps found on colonoscopy   Return for CT Urogram report and cystoscopy.  [name], PA-C  [facility] [address] [place], [place] [address] [phone]

## 2019-12-26 ENCOUNTER — Ambulatory Visit (INDEPENDENT_AMBULATORY_CARE_PROVIDER_SITE_OTHER): Payer: Medicare Other | Admitting: Urology

## 2019-12-26 ENCOUNTER — Encounter: Payer: Self-pay | Admitting: Urology

## 2019-12-26 ENCOUNTER — Other Ambulatory Visit: Payer: Self-pay

## 2019-12-26 VITALS — BP 132/86 | HR 80 | Wt 196.0 lb

## 2019-12-26 DIAGNOSIS — R3129 Other microscopic hematuria: Secondary | ICD-10-CM | POA: Diagnosis not present

## 2019-12-26 DIAGNOSIS — N401 Enlarged prostate with lower urinary tract symptoms: Secondary | ICD-10-CM

## 2019-12-26 DIAGNOSIS — N138 Other obstructive and reflux uropathy: Secondary | ICD-10-CM | POA: Diagnosis not present

## 2019-12-26 LAB — URINALYSIS, COMPLETE
Bilirubin, UA: NEGATIVE
Glucose, UA: NEGATIVE
Ketones, UA: NEGATIVE
Leukocytes,UA: NEGATIVE
Nitrite, UA: NEGATIVE
Protein,UA: NEGATIVE
Specific Gravity, UA: 1.015 (ref 1.005–1.030)
Urobilinogen, Ur: 0.2 mg/dL (ref 0.2–1.0)
pH, UA: 7 (ref 5.0–7.5)

## 2019-12-26 LAB — MICROSCOPIC EXAMINATION

## 2019-12-26 NOTE — Patient Instructions (Signed)
Cystoscopy Cystoscopy is a procedure that is used to help diagnose and sometimes treat conditions that affect the lower urinary tract. The lower urinary tract includes the bladder and the urethra. The urethra is the tube that drains urine from the bladder. Cystoscopy is done using a thin, tube-shaped instrument with a light and camera at the end (cystoscope). The cystoscope may be hard or flexible, depending on the goal of the procedure. The cystoscope is inserted through the urethra, into the bladder. Cystoscopy may be recommended if you have:  Urinary tract infections that keep coming back.  Blood in the urine (hematuria).  An inability to control when you urinate (urinary incontinence) or an overactive bladder.  Unusual cells found in a urine sample.  A blockage in the urethra, such as a urinary stone.  Painful urination.  An abnormality in the bladder found during an intravenous pyelogram (IVP) or CT scan. Cystoscopy may also be done to remove a sample of tissue to be examined under a microscope (biopsy). What are the risks? Generally, this is a safe procedure. However, problems may occur, including:  Infection.  Bleeding.  What happens during the procedure?  1. You will be given one or more of the following: ? A medicine to numb the area (local anesthetic). 2. The area around the opening of your urethra will be cleaned. 3. The cystoscope will be passed through your urethra into your bladder. 4. Germ-free (sterile) fluid will flow through the cystoscope to fill your bladder. The fluid will stretch your bladder so that your health care provider can clearly examine your bladder walls. 5. Your doctor will look at the urethra and bladder. 6. The cystoscope will be removed The procedure may vary among health care providers  What can I expect after the procedure? After the procedure, it is common to have: 1. Some soreness or pain in your abdomen and urethra. 2. Urinary symptoms.  These include: ? Mild pain or burning when you urinate. Pain should stop within a few minutes after you urinate. This may last for up to 1 week. ? A small amount of blood in your urine for several days. ? Feeling like you need to urinate but producing only a small amount of urine. Follow these instructions at home: General instructions  Return to your normal activities as told by your health care provider.   Do not drive for 24 hours if you were given a sedative during your procedure.  Watch for any blood in your urine. If the amount of blood in your urine increases, call your health care provider.  If a tissue sample was removed for testing (biopsy) during your procedure, it is up to you to get your test results. Ask your health care provider, or the department that is doing the test, when your results will be ready.  Drink enough fluid to keep your urine pale yellow.  Keep all follow-up visits as told by your health care provider. This is important. Contact a health care provider if you:  Have pain that gets worse or does not get better with medicine, especially pain when you urinate.  Have trouble urinating.  Have more blood in your urine. Get help right away if you:  Have blood clots in your urine.  Have abdominal pain.  Have a fever or chills.  Are unable to urinate. Summary  Cystoscopy is a procedure that is used to help diagnose and sometimes treat conditions that affect the lower urinary tract.  Cystoscopy is done using   a thin, tube-shaped instrument with a light and camera at the end.  After the procedure, it is common to have some soreness or pain in your abdomen and urethra.  Watch for any blood in your urine. If the amount of blood in your urine increases, call your health care provider.  If you were prescribed an antibiotic medicine, take it as told by your health care provider. Do not stop taking the antibiotic even if you start to feel better. This  information is not intended to replace advice given to you by your health care provider. Make sure you discuss any questions you have with your health care provider. Document Revised: 02/20/2018 Document Reviewed: 02/20/2018 Elsevier Patient Education  2020 [employer]   

## 2019-12-30 DIAGNOSIS — D0472 Carcinoma in situ of skin of left lower limb, including hip: Secondary | ICD-10-CM | POA: Diagnosis not present

## 2019-12-30 DIAGNOSIS — C44729 Squamous cell carcinoma of skin of left lower limb, including hip: Secondary | ICD-10-CM | POA: Diagnosis not present

## 2020-01-01 DIAGNOSIS — Z23 Encounter for immunization: Secondary | ICD-10-CM | POA: Diagnosis not present

## 2020-01-13 ENCOUNTER — Other Ambulatory Visit: Payer: Self-pay

## 2020-01-13 ENCOUNTER — Ambulatory Visit
Admission: RE | Admit: 2020-01-13 | Discharge: 2020-01-13 | Disposition: A | Discharge status: Home / Self Care | Payer: Medicare Other | Source: Ambulatory Visit | Attending: Urology | Admitting: Urology

## 2020-01-13 DIAGNOSIS — R3129 Other microscopic hematuria: Secondary | ICD-10-CM

## 2020-01-13 DIAGNOSIS — N281 Cyst of kidney, acquired: Secondary | ICD-10-CM | POA: Diagnosis not present

## 2020-01-13 DIAGNOSIS — R3915 Urgency of urination: Secondary | ICD-10-CM | POA: Diagnosis not present

## 2020-01-13 DIAGNOSIS — M160 Bilateral primary osteoarthritis of hip: Secondary | ICD-10-CM | POA: Diagnosis not present

## 2020-01-13 LAB — POCT I-STAT CREATININE: Creatinine, Ser: 0.9 mg/dL (ref 0.61–1.24)

## 2020-01-13 MED ORDER — IOHEXOL 300 MG/ML  SOLN
125.0000 mL | Freq: Once | INTRAMUSCULAR | Status: AC | PRN
  Administered 2020-01-13: 125 mL via INTRAVENOUS

## 2020-01-15 ENCOUNTER — Encounter: Payer: Self-pay | Admitting: Pain Medicine

## 2020-01-16 ENCOUNTER — Other Ambulatory Visit: Payer: Self-pay

## 2020-01-16 ENCOUNTER — Ambulatory Visit: Payer: Medicare Other | Attending: Pain Medicine | Admitting: Pain Medicine

## 2020-01-16 DIAGNOSIS — M79652 Pain in left thigh: Secondary | ICD-10-CM | POA: Diagnosis not present

## 2020-01-16 DIAGNOSIS — M76892 Other specified enthesopathies of left lower limb, excluding foot: Secondary | ICD-10-CM | POA: Insufficient documentation

## 2020-01-16 DIAGNOSIS — M792 Neuralgia and neuritis, unspecified: Secondary | ICD-10-CM | POA: Insufficient documentation

## 2020-01-16 DIAGNOSIS — M25552 Pain in left hip: Secondary | ICD-10-CM

## 2020-01-16 DIAGNOSIS — M7061 Trochanteric bursitis, right hip: Secondary | ICD-10-CM | POA: Insufficient documentation

## 2020-01-16 DIAGNOSIS — M7918 Myalgia, other site: Secondary | ICD-10-CM | POA: Insufficient documentation

## 2020-01-16 DIAGNOSIS — M76891 Other specified enthesopathies of right lower limb, excluding foot: Secondary | ICD-10-CM | POA: Insufficient documentation

## 2020-01-16 DIAGNOSIS — M25551 Pain in right hip: Secondary | ICD-10-CM

## 2020-01-16 DIAGNOSIS — M8949 Other hypertrophic osteoarthropathy, multiple sites: Secondary | ICD-10-CM

## 2020-01-16 DIAGNOSIS — G8929 Other chronic pain: Secondary | ICD-10-CM | POA: Insufficient documentation

## 2020-01-16 DIAGNOSIS — G894 Chronic pain syndrome: Secondary | ICD-10-CM | POA: Diagnosis not present

## 2020-01-16 DIAGNOSIS — M7062 Trochanteric bursitis, left hip: Secondary | ICD-10-CM | POA: Insufficient documentation

## 2020-01-16 DIAGNOSIS — M159 Polyosteoarthritis, unspecified: Secondary | ICD-10-CM

## 2020-01-16 MED ORDER — OXYCODONE HCL 5 MG PO TABS: 5.0000 mg | ORAL_TABLET | Freq: Every evening | ORAL | 0 refills | Status: DC | PRN

## 2020-01-16 MED ORDER — DICLOFENAC SODIUM 1 % EX GEL: 4.0000 g | Freq: Four times a day (QID) | CUTANEOUS | 99 refills | Status: AC | PRN

## 2020-01-16 MED ORDER — PREGABALIN 150 MG PO CAPS: 150.0000 mg | ORAL_CAPSULE | Freq: Every day | ORAL | 2 refills | Status: DC

## 2020-01-16 NOTE — Patient Instructions (Signed)
____________________________________________________________________________________________  Preparing for Procedure with Sedation  Procedure appointments are limited to planned procedures: . No Prescription Refills. . No disability issues will be discussed. . No medication changes will be discussed.  Instructions: . Oral Intake: Do not eat or drink anything for at least 8 hours prior to your procedure. (Exception: Blood Pressure Medication. See below.) . Transportation: Unless otherwise stated by your physician, you may drive yourself after the procedure. . Blood Pressure Medicine: Do not forget to take your blood pressure medicine with a sip of water the morning of the procedure. If your Diastolic (lower reading)is above 100 mmHg, elective cases will be cancelled/rescheduled. . Blood thinners: These will need to be stopped for procedures. Notify our staff if you are taking any blood thinners. Depending on which one you take, there will be specific instructions on how and when to stop it. . Diabetics on insulin: Notify the staff so that you can be scheduled 1st case in the morning. If your diabetes requires high dose insulin, take only  of your normal insulin dose the morning of the procedure and notify the staff that you have done so. . Preventing infections: Shower with an antibacterial soap the morning of your procedure. . Build-up your immune system: Take 1000 mg of Vitamin C with every meal (3 times a day) the day prior to your procedure. . Antibiotics: Inform the staff if you have a condition or reason that requires you to take antibiotics before dental procedures. . Pregnancy: If you are pregnant, call and cancel the procedure. . Sickness: If you have a cold, fever, or any active infections, call and cancel the procedure. . Arrival: You must be in the facility at least 30 minutes prior to your scheduled procedure. . Children: Do not bring children with you. . Dress appropriately:  Bring dark clothing that you would not mind if they get stained. . Valuables: Do not bring any jewelry or valuables.  Reasons to call and reschedule or cancel your procedure: (Following these recommendations will minimize the risk of a serious complication.) . Surgeries: Avoid having procedures within 2 weeks of any surgery. (Avoid for 2 weeks before or after any surgery). . Flu Shots: Avoid having procedures within 2 weeks of a flu shots or . (Avoid for 2 weeks before or after immunizations). . Barium: Avoid having a procedure within 7-10 days after having had a radiological study involving the use of radiological contrast. (Myelograms, Barium swallow or enema study). . Heart attacks: Avoid any elective procedures or surgeries for the initial 6 months after a "Myocardial Infarction" (Heart Attack). . Blood thinners: It is imperative that you stop these medications before procedures. Let us know if you if you take any blood thinner.  . Infection: Avoid procedures during or within two weeks of an infection (including chest colds or gastrointestinal problems). Symptoms associated with infections include: Localized redness, fever, chills, night sweats or profuse sweating, burning sensation when voiding, cough, congestion, stuffiness, runny nose, sore throat, diarrhea, nausea, vomiting, cold or Flu symptoms, recent or current infections. It is specially important if the infection is over the area that we intend to treat. . Heart and lung problems: Symptoms that may suggest an active cardiopulmonary problem include: cough, chest pain, breathing difficulties or shortness of breath, dizziness, ankle swelling, uncontrolled high or unusually low blood pressure, and/or palpitations. If you are experiencing any of these symptoms, cancel your procedure and contact your primary care physician for an evaluation.  Remember:  Regular Business hours are:    Monday to Thursday 8:00 AM to 4:00 PM  Provider's  Schedule: Juanette Urizar, MD:  Procedure days: Tuesday and Thursday 7:30 AM to 4:00 PM  Bilal Lateef, MD:  Procedure days: Monday and Wednesday 7:30 AM to 4:00 PM ____________________________________________________________________________________________    

## 2020-01-16 NOTE — Progress Notes (Signed)
Patient: Matthew Maldonado  Service Category: E/M  Provider: Gaspar Cola, MD  DOB: 10/07/1950  DOS: 01/16/2020  Location: Office  MRN: [id number]  Setting: Ambulatory outpatient  Referring Provider: Nobie Putnam *  Type: Established Patient  Specialty: Interventional Pain Management  PCP: Olin Hauser, DO  Location: Remote location  Delivery: TeleHealth     Virtual Encounter - Pain Management PROVIDER NOTE: Information contained herein reflects review and annotations entered in association with encounter. Interpretation of such information and data should be left to medically-trained personnel. Information provided to patient can be located elsewhere in the medical record under "Patient Instructions". Document created using STT-dictation technology, any transcriptional errors that may result from process are unintentional.    Contact & Pharmacy Preferred: [phone] Home: [phone] (home) Mobile: [phone] (mobile) E-mail: [email or web address]  [place], [address] [place] [address] Phone: [phone] Fax: [phone]   Pre-screening  Mr. Guidice offered "in-person" vs "virtual" encounter. He indicated preferring virtual for this encounter.   Reason COVID-19*  Social distancing based on CDC and AMA recommendations.   I contacted Matthew Maldonado on 01/16/2020 via telephone.      I clearly identified myself as Gaspar Cola, MD. I verified that I was speaking with the correct person using two identifiers (Name: JAYKE CAUL, and date of birth: 13-Dec-1950).  Consent I sought verbal advanced consent from Matthew Maldonado for virtual visit interactions. I informed Mr. Mihalik of possible security and privacy concerns, risks, and limitations associated with providing "not-in-person" medical evaluation and management services. I also informed Mr. Mezera of the availability of "in-person" appointments. Finally, I informed him that  there would be a charge for the virtual visit and that he could be  personally, fully or partially, financially responsible for it. Mr. Seith expressed understanding and agreed to proceed.   Historic Elements   Mr. KAELON WEEKES is a 69 y.o. year old, male patient evaluated today after our last contact on 12/04/2019. Mr. Beckford  has a past medical history of Anxiety, Arthritis, Blood vessel disorder, BPH (benign prostatic hyperplasia), GERD (gastroesophageal reflux disease), Insomnia, Minimal change disease, Nocturia, PSVT (paroxysmal supraventricular tachycardia) ([place]), and TIA (transient ischemic attack). He also  has a past surgical history that includes Renal biopsy; Nasal septoplasty w/ turbinoplasty (Bilateral, 03/22/2018); Nasopharyngoscopy eustation tube balloon dilation (Bilateral, 03/22/2018); Nasal endoscopy (Bilateral, 03/22/2018); Excision nasal mass (Left, 03/22/2018); and Colonoscopy with propofol (N/A, 04/04/2019). Mr. Noreen has a current medication list which includes the following prescription(s): atorvastatin, buspirone, diclofenac sodium, esomeprazole, fluticasone, ketoconazole, metoprolol succinate, multivitamin with minerals, [START ON 02/20/2020] pregabalin, venlafaxine xr, [START ON 02/20/2020] diclofenac sodium, and oxycodone. He  reports that he has quit smoking. His smoking use included cigars. He quit after 4.00 years of use. He has quit using smokeless tobacco. He reports current alcohol use of about 18.0 standard drinks of alcohol per week. He reports that he does not use drugs. Mr. Krinke has No Known Allergies.   HPI  Today, he is being contacted for medication management.  The patient indicates doing well with the current medication regimen. No adverse reactions or side effects reported to the medications.  He refers that he is having some recurrence of his hip pain and he would like to have the hip injections repeated.  The last time that we did that was around March 2021.  He indicates that he  is planning on going on vacation around November  15 and he would like to have the injection done before then, if at all possible.  RTCB: 04/15/2020 Nonopioids transferred 01/16/2020: Voltaren gel and Lyrica.  Pharmacotherapy Assessment  Analgesic: Oxycodone IR 5 mg, 1-2 tabs PO HS (10 mg/day of oxycodone) MME/day: 15 mg/day.   Monitoring: [place] PMP: PDMP reviewed during this encounter.       Pharmacotherapy: No side-effects or adverse reactions reported. Compliance: No problems identified. Effectiveness: Clinically acceptable. Plan: Refer to "POC".  UDS:  Summary  Date Value Ref Range Status  08/01/2019 Note  Final    Comment:    ==================================================================== [facility] (MW) ==================================================================== Test                             Result       Flag       Units Drug Present not Declared for Prescription Verification   Amphetamine                    585          UNEXPECTED ng/mg creat    Amphetamine is available as a schedule II prescription drug. Drug Absent but Declared for Prescription Verification   Oxycodone                      Not Detected UNEXPECTED ng/mg creat ==================================================================== Test                      Result    Flag   Units      Ref Range   Creatinine              84               mg/dL      >=20 ==================================================================== Declared Medications:  The flagging and interpretation on this report are based on the  following declared medications.  Unexpected results may arise from  inaccuracies in the declared medications.  **Note: The testing scope of this panel includes these medications:  Oxycodone  **Note: The testing scope of this panel does not include the  following reported medications:  Atorvastatin  Buspirone  Cyclobenzaprine  Esomeprazole (Nexium)  Fluticasone  Metoprolol   Multivitamin  Pregabalin  Topical  Topical Diclofenac  Venlafaxine ==================================================================== For clinical consultation, please call [phone]. ====================================================================     Laboratory Chemistry Profile   Renal Lab Results  Component Value Date   BUN 26 (H) 10/14/2019   CREATININE 0.90 01/13/2020   BCR 28 (H) 10/14/2019   GFRAA 96 10/14/2019   GFRNONAA 83 10/14/2019     Hepatic Lab Results  Component Value Date   AST 23 10/14/2019   ALT 25 10/14/2019   ALBUMIN 4.6 12/26/2018   ALKPHOS 82 12/26/2018     Electrolytes Lab Results  Component Value Date   NA 141 10/14/2019   K 4.7 10/14/2019   CL 106 10/14/2019   CALCIUM 9.4 10/14/2019   MG 2.1 12/26/2018     Bone Lab Results  Component Value Date   25OHVITD1 43 12/26/2018   25OHVITD2 <1.0 12/26/2018   25OHVITD3 43 12/26/2018     Inflammation (CRP: Acute Phase) (ESR: Chronic Phase) Lab Results  Component Value Date   CRP 1 12/26/2018   ESRSEDRATE 16 12/26/2018       Note: Above Lab results reviewed.  Imaging  CT HEMATURIA WORKUP CLINICAL DATA:  Microscopic hematuria, urinary urgency.  EXAM: CT ABDOMEN AND PELVIS WITHOUT AND WITH CONTRAST  TECHNIQUE: Multidetector CT imaging of the abdomen and pelvis was performed following the standard protocol before and following the bolus administration of intravenous contrast.  CONTRAST:  137m OMNIPAQUE IOHEXOL 300 MG/ML  SOLN  COMPARISON:  None.  FINDINGS: Lower chest: Subsegmental volume loss in lung bases. Heart size normal. No pericardial or pleural effusion. Distal esophagus is unremarkable.  Hepatobiliary: Low-attenuation lesions in the liver measure up to 3.5 cm in the right hepatic lobe and are likely cysts. Liver and gallbladder are otherwise unremarkable. No biliary ductal dilatation.  Pancreas: Negative.  Spleen: Negative.  Adrenals/Urinary Tract:  Adrenal glands are unremarkable. 2.0 cm cyst in the right kidney. No urinary stones. Ureters are decompressed. There may be a small urachal remnant. Bladder is otherwise unremarkable.  Stomach/Bowel: Stomach, small bowel and colon are unremarkable. Appendix is not readily visualized.  Vascular/Lymphatic: Atherosclerotic calcification of the aorta. Circumaortic left renal vein. No pathologically enlarged lymph nodes.  Reproductive: Prostate is visualized.  Other: No free fluid.  Mesenteries and peritoneum are unremarkable.  Musculoskeletal: Degenerative changes in the spine. No worrisome lytic or sclerotic lesions. Degenerative changes in the hips.  IMPRESSION: 1. No findings to explain the patient's clinical history. 2.  Aortic atherosclerosis (ICD10-I70.0).  Electronically Signed   By: MLorin PicketM.D.   On: 01/13/2020 14:45  Assessment  The primary encounter diagnosis was Chronic pain syndrome. Diagnoses of Neurogenic pain, Osteoarthritis involving multiple joints, Chronic hip pain (Tertiary Area of Pain) (Bilateral) (L>R), Chronic buttock pain (Secondary Area of Pain) (Bilateral) (L>R), Chronic thigh pain (Fourth Area of Pain) (Left), Enthesopathy of hip region (Bilateral), and Trochanteric bursitis of hips (Bilateral) were also pertinent to this visit.  Plan of Care  Problem-specific:  No problem-specific Assessment & Plan notes found for this encounter.  Mr. KKAREN KINNARDhas a current medication list which includes the following long-term medication(s): atorvastatin, diclofenac sodium, esomeprazole, fluticasone, metoprolol succinate, [START ON 02/20/2020] pregabalin, venlafaxine xr, [START ON 02/20/2020] diclofenac sodium, and oxycodone.  Pharmacotherapy (Medications Ordered): Meds ordered this encounter  Medications  . oxyCODONE (OXY IR/ROXICODONE) 5 MG immediate release tablet    Sig: Take 1-2 tablets (5-10 mg total) by mouth at bedtime as needed for severe pain. Must  last 30 days.    Dispense:  30 tablet    Refill:  0    Chronic Pain: STOP Act (Not applicable) Fill 1 day early if closed on refill date. Avoid benzodiazepines within 8 hours of opioids  . pregabalin (LYRICA) 150 MG capsule    Sig: Take 1 capsule (150 mg total) by mouth at bedtime.    Dispense:  30 capsule    Refill:  2    Fill one day early if pharmacy is closed on scheduled refill date. May substitute for generic, or similar, if available. Void any older refills or prescriptions of this medication.  . diclofenac Sodium (VOLTAREN) 1 % GEL    Sig: Apply 4 g topically 4 (four) times daily as needed.    Dispense:  350 g    Refill:  PRN    Fill one day early if pharmacy is closed on scheduled refill date. May substitute for generic, or similar, if available. Void any older refills or prescriptions of this medication.   Orders:  Orders Placed This Encounter  Procedures  . HIP INJECTION    Standing Status:   Future    Standing Expiration Date:   04/17/2020    Scheduling Instructions:  Side: Bilateral     Sedation: With Sedation.     Timeframe: As soon as schedule allows   Follow-up plan:   Return for Procedure (w/ sedation): (B) Ia Hip inj. + Gluteofemoral Bursa Inj. #2.      Interventional treatment options: Planned, scheduled, and/or pending:      Under consideration:   Palliative procedures    Therapeutic/palliative (PRN):   Therapeutic bilateral IA hip joint injection #2  Therapeutic bilateral gluteal femoral bursa injection #2  Therapeutic/palliative left L3-4 LESI #4      Recent Visits Date Type Provider Dept  12/04/19 Telemedicine Molli Barrows, MD [facility]  Showing recent visits within past 90 days and meeting all other requirements Today's Visits Date Type Provider Dept  01/16/20 Telemedicine [name], MD [facility]  Showing today's visits and meeting all other requirements Future Appointments No visits were found meeting these  conditions. Showing future appointments within next 90 days and meeting all other requirements  I discussed the assessment and treatment plan with the patient. The patient was provided an opportunity to ask questions and all were answered. The patient agreed with the plan and demonstrated an understanding of the instructions.  Patient advised to call back or seek an in-person evaluation if the symptoms or condition worsens.  Duration of encounter: 13 minutes.  Note by: Gaspar Cola, MD Date: 01/16/2020; Time: 3:20 PM

## 2020-01-22 NOTE — Progress Notes (Addendum)
PROVIDER NOTE: Information contained herein reflects review and annotations entered in association with encounter. Interpretation of such information and data should be left to medically-trained personnel. Information provided to patient can be located elsewhere in the medical record under "Patient Instructions". Document created using STT-dictation technology, any transcriptional errors that may result from process are unintentional.    Patient: Matthew Maldonado  Service Category: Procedure  Provider: Gaspar Cola, MD  DOB: September 16, 1950  DOS: 01/23/2020  Location: [facility]  MRN: [id number]  Setting: Ambulatory - outpatient  Referring Provider: Nobie Putnam *  Type: Established Patient  Specialty: Interventional Pain Management  PCP: Olin Hauser, DO   Primary Reason for Visit: Interventional Pain Management Treatment. CC: Hip Pain  Procedure #1:  Anesthesia, Analgesia, Anxiolysis:  Type: Intra-Articular Hip Injection #2  Primary Purpose: Diagnostic Region: Posterolateral hip joint area. Level: Lower pelvic and hip joint level. Target Area: Superior aspect of the hip joint cavity, going thru the superior portion of the capsular ligament. Approach: Posterolateral approach. Laterality: Bilateral  Type: Local Anesthesia Indication(s): Analgesia         Route: Infiltration ([place]/IM) IV Access: Declined Sedation: Declined  Local Anesthetic: Lidocaine 1-2%  Position: Prone Area Prepped: Entire Posterolateral hip area. DuraPrep (Iodine Povacrylex [0.7% available iodine] and Isopropyl Alcohol, 74% w/w)   Procedure #2:    Type: Gluteofemoral Bursa Injection #2  Primary Purpose: Diagnostic Region: Upper (proximal) Femoral Region Level: Hip Joint Target Area: Superior aspect of the hip joint cavity, going thru the superior portion of the capsular ligament. Approach: Posterolateral approach Laterality: Bilateral     Indications: 1. Chronic hip pain  (Tertiary Area of Pain) (Bilateral) (L>R)   2. Enthesopathy of hip region (Bilateral)   3. Osteoarthritis of hips (Bilateral)   4. Trochanteric bursitis of hips (Bilateral)   5. Bursitis of other bursa of both hips    Pain Score: Pre-procedure: 5 /10 Post-procedure: 5 /10   Pre-op H&P:  [name] is a 69 y.o. (year old), male patient, seen today for interventional treatment. He  has a past surgical history that includes Renal biopsy; Nasal septoplasty w/ turbinoplasty (Bilateral, 03/22/2018); Nasopharyngoscopy eustation tube balloon dilation (Bilateral, 03/22/2018); Nasal endoscopy (Bilateral, 03/22/2018); Excision nasal mass (Left, 03/22/2018); and Colonoscopy with propofol (N/A, 04/04/2019). Mr. Franchi has a current medication list which includes the following prescription(s): atorvastatin, buspirone, [START ON 02/20/2020] diclofenac sodium, diclofenac sodium, esomeprazole, fluticasone, ketoconazole, metoprolol succinate, multivitamin with minerals, oxycodone, [START ON 02/20/2020] pregabalin, and venlafaxine xr. His primarily concern today is the Hip Pain  Initial Vital Signs:  Pulse/HCG Rate: 62ECG Heart Rate: (!) 55 Temp: 97.9 F (36.6 C) Resp: 16 BP: 130/86 SpO2: 97 %  BMI: Estimated body mass index is 29.65 kg/m as calculated from the following:   Height as of this encounter: 5\' 8"  (1.727 m).   Weight as of this encounter: 195 lb (88.5 kg).  Risk Assessment: Allergies: Reviewed. He has No Known Allergies.  Allergy Precautions: None required Coagulopathies: Reviewed. None identified.  Blood-thinner therapy: None at this time Active Infection(s): Reviewed. None identified. Mr. Suydam is afebrile  Site Confirmation: Mr. Buntyn was asked to confirm the procedure and laterality before marking the site Procedure checklist: Completed Consent: Before the procedure and under the influence of no sedative(s), amnesic(s), or anxiolytics, the patient was informed of the treatment options, risks and  possible complications. To fulfill our ethical and legal obligations, as recommended by the American Medical Association's Code of Ethics, I have informed the  patient of my clinical impression; the nature and purpose of the treatment or procedure; the risks, benefits, and possible complications of the intervention; the alternatives, including doing nothing; the risk(s) and benefit(s) of the alternative treatment(s) or procedure(s); and the risk(s) and benefit(s) of doing nothing. The patient was provided information about the general risks and possible complications associated with the procedure. These may include, but are not limited to: failure to achieve desired goals, infection, bleeding, organ or nerve damage, allergic reactions, paralysis, and death. In addition, the patient was informed of those risks and complications associated to the procedure, such as failure to decrease pain; infection; bleeding; organ or nerve damage with subsequent damage to sensory, motor, and/or autonomic systems, resulting in permanent pain, numbness, and/or weakness of one or several areas of the body; allergic reactions; (i.e.: anaphylactic reaction); and/or death. Furthermore, the patient was informed of those risks and complications associated with the medications. These include, but are not limited to: allergic reactions (i.e.: anaphylactic or anaphylactoid reaction(s)); adrenal axis suppression; blood sugar elevation that in diabetics may result in ketoacidosis or comma; water retention that in patients with history of congestive heart failure may result in shortness of breath, pulmonary edema, and decompensation with resultant heart failure; weight gain; swelling or edema; medication-induced neural toxicity; particulate matter embolism and blood vessel occlusion with resultant organ, and/or nervous system infarction; and/or aseptic necrosis of one or more joints. Finally, the patient was informed that Medicine is not an  exact science; therefore, there is also the possibility of unforeseen or unpredictable risks and/or possible complications that may result in a catastrophic outcome. The patient indicated having understood very clearly. We have given the patient no guarantees and we have made no promises. Enough time was given to the patient to ask questions, all of which were answered to the patient's satisfaction. Mr. Gautier has indicated that he wanted to continue with the procedure. Attestation: I, the ordering provider, attest that I have discussed with the patient the benefits, risks, side-effects, alternatives, likelihood of achieving goals, and potential problems during recovery for the procedure that I have provided informed consent. Date  Time: 01/23/2020  9:11 AM  Pre-Procedure Preparation:  Monitoring: As per clinic protocol. Respiration, ETCO2, SpO2, BP, heart rate and rhythm monitor placed and checked for adequate function Safety Precautions: Patient was assessed for positional comfort and pressure points before starting the procedure. Time-out: I initiated and conducted the "Time-out" before starting the procedure, as per protocol. The patient was asked to participate by confirming the accuracy of the "Time Out" information. Verification of the correct person, site, and procedure were performed and confirmed by me, the nursing staff, and the patient. "Time-out" conducted as per Joint Commission's Universal Protocol (UP.01.01.01). Time: 1010  Description of Procedure #1:  Safety Precautions: Aspiration looking for blood return was conducted prior to all injections. At no point did we inject any substances, as a needle was being advanced. No attempts were made at seeking any paresthesias. Safe injection practices and needle disposal techniques used. Medications properly checked for expiration dates. SDV (single dose vial) medications used. Description of the Procedure: Protocol guidelines were followed. The  patient was placed in position over the fluoroscopy table. The target area was identified and the area prepped in the usual manner. Skin & deeper tissues infiltrated with local anesthetic. Appropriate amount of time allowed to pass for local anesthetics to take effect. The procedure needles were then advanced to the target area. Proper needle placement secured. Negative aspiration confirmed.  Solution injected in intermittent fashion, asking for systemic symptoms every 0.5cc of injectate. The needles were then removed and the area cleansed, making sure to leave some of the prepping solution back to take advantage of its long term bactericidal properties.  Start Time: 1010 hrs. Materials:  Needle(s) Type: Spinal Needle Gauge: 22G Length: 5.0-in Medication(s): Please see orders for medications and dosing details.  Imaging Guidance (Non-Spinal):          Type of Imaging Technique: Fluoroscopy Guidance (Non-Spinal) Indication(s): Assistance in needle guidance and placement for procedures requiring needle placement in or near specific anatomical locations not easily accessible without such assistance. Exposure Time: Please see nurses notes. Contrast: Before injecting any contrast, we confirmed that the patient did not have an allergy to iodine, shellfish, or radiological contrast. Once satisfactory needle placement was completed at the desired level, radiological contrast was injected. Contrast injected under live fluoroscopy. No contrast complications. See chart for type and volume of contrast used. Fluoroscopic Guidance: I was personally present during the use of fluoroscopy. "Tunnel Vision Technique" used to obtain the best possible view of the target area. Parallax error corrected before commencing the procedure. "Direction-depth-direction" technique used to introduce the needle under continuous pulsed fluoroscopy. Once target was reached, antero-posterior, oblique, and lateral fluoroscopic projection  used confirm needle placement in all planes. Images permanently stored in EMR. Interpretation: I personally interpreted the imaging intraoperatively. Adequate needle placement confirmed in multiple planes. Appropriate spread of contrast into desired area was observed. No evidence of afferent or efferent intravascular uptake. Permanent images saved into the patient's record.   Description of Procedure #2:  Description of the Procedure: Skin & deeper tissues infiltrated with local anesthetic. Appropriate amount of time allowed to pass for local anesthetics to take effect. The procedure needles were then advanced to the target area. Proper needle placement secured. Negative aspiration confirmed. Solution injected in intermittent fashion, asking for systemic symptoms every 0.5cc of injectate. The needles were then removed and the area cleansed, making sure to leave some of the prepping solution back to take advantage of its long term bactericidal properties.  Vitals:   01/23/20 1010 01/23/20 1015 01/23/20 1020 01/23/20 1023  BP: (!) 133/104 (!) 129/97 (!) 129/98 (!) 128/98  Pulse:      Resp: 16 16 18 16   Temp:      SpO2: 97% 96% 96% 96%  Weight:      Height:         End Time: 1022 hrs.     Materials:  Needle(s) Type: Spinal Needle Gauge: 22G Length: 5.0-in Medication(s): Please see orders for medications and dosing details.  Imaging Guidance (Non-Spinal):          Type of Imaging Technique: Fluoroscopy Guidance (Non-Spinal) Indication(s): Assistance in needle guidance and placement for procedures requiring needle placement in or near specific anatomical locations not easily accessible without such assistance. Exposure Time: Please see nurses notes. Contrast: Before injecting any contrast, we confirmed that the patient did not have an allergy to iodine, shellfish, or radiological contrast. Once satisfactory needle placement was completed at the desired level, radiological contrast was  injected. Contrast injected under live fluoroscopy. No contrast complications. See chart for type and volume of contrast used. Fluoroscopic Guidance: I was personally present during the use of fluoroscopy. "Tunnel Vision Technique" used to obtain the best possible view of the target area. Parallax error corrected before commencing the procedure. "Direction-depth-direction" technique used to introduce the needle under continuous pulsed fluoroscopy. Once target was reached,  antero-posterior, oblique, and lateral fluoroscopic projection used confirm needle placement in all planes. Images permanently stored in EMR. Interpretation: I personally interpreted the imaging intraoperatively. Adequate needle placement confirmed in multiple planes. Appropriate spread of contrast into desired area was observed. No evidence of afferent or efferent intravascular uptake. Permanent images saved into the patient's record.  Antibiotic Prophylaxis:   Anti-infectives (From admission, onward)   None     Indication(s): None identified  Post-operative Assessment:  Post-procedure Vital Signs:  Pulse/HCG Rate: 62(!) 59 Temp: 97.9 F (36.6 C) Resp: 16 BP: (!) 128/98 SpO2: 96 %  EBL: None  Complications: No immediate post-treatment complications observed by team, or reported by patient.  Note: The patient tolerated the entire procedure well. A repeat set of vitals were taken after the procedure and the patient was kept under observation following institutional policy, for this type of procedure. Post-procedural neurological assessment was performed, showing return to baseline, prior to discharge. The patient was provided with post-procedure discharge instructions, including a section on how to identify potential problems. Should any problems arise concerning this procedure, the patient was given instructions to immediately contact us, at any time, without hesitation. In any case, we plan to contact the patient by  telephone for a follow-up status report regarding this interventional procedure.  Comments:  No additional relevant information.  Plan of Care  Orders:  Orders Placed This Encounter  Procedures  . HIP INJECTION    Scheduling Instructions:     Side: Bilateral     Sedation: Patient's choice.     Timeframe: Today  . [facility] C-ARM 1-60 MIN NO REPORT    Intraoperative interpretation by procedural physician at [place].    Standing Status:   Standing    Number of Occurrences:   1    Order Specific Question:   Reason for exam:    Answer:   Assistance in needle guidance and placement for procedures requiring needle placement in or near specific anatomical locations not easily accessible without such assistance.  . Informed Consent Details: Physician/Practitioner Attestation; Transcribe to consent Maldonado and obtain patient signature    Nursing Order: Transcribe to consent Maldonado and obtain patient signature. Note: Always confirm laterality of pain with Mr. Lahaie, before procedure.    Order Specific Question:   Physician/Practitioner attestation of informed consent for procedure/surgical case    Answer:   I, the physician/practitioner, attest that I have discussed with the patient the benefits, risks, side effects, alternatives, likelihood of achieving goals and potential problems during recovery for the procedure that I have provided informed consent.    Order Specific Question:   Procedure    Answer:   Hip injection    Order Specific Question:   Physician/Practitioner performing the procedure    Answer:   Negin Hegg A. Dossie Arbour, MD    Order Specific Question:   Indication/Reason    Answer:   Hip Joint Pain (Arthralgia)  . Provide equipment / supplies at bedside    "Block Tray" (Disposable  single use) Needle type: SpinalSpinal Amount/quantity: 2 Size: Medium (5-inch) Gauge: 22G    Standing Status:   Standing    Number of Occurrences:   1    Order Specific Question:    Specify    Answer:   Block Tray   Chronic Opioid Analgesic:  Oxycodone IR 5 mg, 1-2 tabs PO HS (10 mg/day of oxycodone) MME/day: 15 mg/day.   Medications ordered for procedure: Meds ordered this encounter  Medications  . iohexol (  OMNIPAQUE) 180 MG/ML injection 10 mL    Must be Myelogram-compatible. If not available, you may substitute with a water-soluble, non-ionic, hypoallergenic, myelogram-compatible radiological contrast medium.  Marland Kitchen lidocaine (XYLOCAINE) 2 % (with pres) injection 400 mg  . DISCONTD: lactated ringers infusion 1,000 mL  . DISCONTD: midazolam (VERSED) 5 MG/5ML injection 1-2 mg    Make sure Flumazenil is available in the pyxis when using this medication. If oversedation occurs, administer 0.2 mg IV over 15 sec. If after 45 sec no response, administer 0.2 mg again over 1 min; may repeat at 1 min intervals; not to exceed 4 doses (1 mg)  . DISCONTD: fentaNYL (SUBLIMAZE) injection 25-50 mcg    Make sure Narcan is available in the pyxis when using this medication. In the event of respiratory depression (RR< 8/min): Titrate NARCAN (naloxone) in increments of 0.1 to 0.2 mg IV at 2-3 minute intervals, until desired degree of reversal.  . ropivacaine (PF) 2 mg/mL (0.2%) (NAROPIN) injection 9 mL  . methylPREDNISolone acetate (DEPO-MEDROL) injection 80 mg  . methylPREDNISolone acetate (DEPO-MEDROL) injection 80 mg  . ropivacaine (PF) 2 mg/mL (0.2%) (NAROPIN) injection 9 mL   Medications administered: We administered iohexol, lidocaine, ropivacaine (PF) 2 mg/mL (0.2%), methylPREDNISolone acetate, methylPREDNISolone acetate, and ropivacaine (PF) 2 mg/mL (0.2%).  See the medical record for exact dosing, route, and time of administration.  Follow-up plan:   Return in about 2 weeks (around 02/06/2020) for (F2F), (PP) Follow-up.       Interventional treatment options: Planned, scheduled, and/or pending:      Under consideration:   Palliative procedures    Therapeutic/palliative  (PRN):   Therapeutic bilateral IA hip joint injection #2  Therapeutic bilateral gluteal femoral bursa injection #2  Therapeutic/palliative left L3-4 LESI #4       Recent Visits Date Type Provider Dept  01/16/20 Telemedicine [name], [facility]  12/04/19 Telemedicine Molli Barrows, MD [facility]  Showing recent visits within past 90 days and meeting all other requirements Today's Visits Date Type Provider Dept  01/23/20 Procedure visit [name], MD [facility]  Showing today's visits and meeting all other requirements Future Appointments Date Type Provider Dept  02/12/20 Appointment [name], MD [facility]  Showing future appointments within next 90 days and meeting all other requirements  Disposition: Discharge home  Discharge (Date  Time): 01/23/2020; 1035 hrs.   Primary Care Physician: Olin Hauser, DO Location: [facility] Note by: Gaspar Cola, MD Date: 01/23/2020; Time: 2:17 PM  Disclaimer:  Medicine is not an Chief Strategy Officer. The only guarantee in medicine is that nothing is guaranteed. It is important to note that the decision to proceed with this intervention was based on the information collected from the patient. The Data and conclusions were drawn from the patient's questionnaire, the interview, and the physical examination. Because the information was provided in large part by the patient, it cannot be guaranteed that it has not been purposely or unconsciously manipulated. Every effort has been made to obtain as much relevant data as possible for this evaluation. It is important to note that the conclusions that lead to this procedure are derived in large part from the available data. Always take into account that the treatment will also be dependent on availability of resources and existing treatment guidelines, considered by other Pain Management  Practitioners as being common knowledge and practice, at the time of the intervention. For Medico-Legal purposes, it is also important  to point out that variation in procedural techniques and pharmacological choices are the acceptable norm. The indications, contraindications, technique, and results of the above procedure should only be interpreted and judged by a Board-Certified Interventional Pain Specialist with extensive familiarity and expertise in the same exact procedure and technique.

## 2020-01-23 ENCOUNTER — Ambulatory Visit
Admission: RE | Admit: 2020-01-23 | Discharge: 2020-01-23 | Disposition: A | Discharge status: Home / Self Care | Payer: Medicare Other | Source: Ambulatory Visit | Attending: Pain Medicine | Admitting: Pain Medicine

## 2020-01-23 ENCOUNTER — Ambulatory Visit (HOSPITAL_BASED_OUTPATIENT_CLINIC_OR_DEPARTMENT_OTHER): Payer: Medicare Other | Admitting: Pain Medicine

## 2020-01-23 ENCOUNTER — Encounter: Payer: Self-pay | Admitting: Pain Medicine

## 2020-01-23 ENCOUNTER — Other Ambulatory Visit: Payer: Self-pay

## 2020-01-23 VITALS — BP 128/98 | HR 62 | Temp 97.9°F | Resp 16 | Ht 68.0 in | Wt 195.0 lb

## 2020-01-23 DIAGNOSIS — G8929 Other chronic pain: Secondary | ICD-10-CM | POA: Insufficient documentation

## 2020-01-23 DIAGNOSIS — M7062 Trochanteric bursitis, left hip: Secondary | ICD-10-CM | POA: Insufficient documentation

## 2020-01-23 DIAGNOSIS — M160 Bilateral primary osteoarthritis of hip: Secondary | ICD-10-CM

## 2020-01-23 DIAGNOSIS — M7061 Trochanteric bursitis, right hip: Secondary | ICD-10-CM | POA: Insufficient documentation

## 2020-01-23 DIAGNOSIS — M76892 Other specified enthesopathies of left lower limb, excluding foot: Secondary | ICD-10-CM | POA: Diagnosis not present

## 2020-01-23 DIAGNOSIS — M76891 Other specified enthesopathies of right lower limb, excluding foot: Secondary | ICD-10-CM

## 2020-01-23 DIAGNOSIS — M7071 Other bursitis of hip, right hip: Secondary | ICD-10-CM | POA: Insufficient documentation

## 2020-01-23 DIAGNOSIS — M25551 Pain in right hip: Secondary | ICD-10-CM

## 2020-01-23 DIAGNOSIS — M7072 Other bursitis of hip, left hip: Secondary | ICD-10-CM | POA: Insufficient documentation

## 2020-01-23 DIAGNOSIS — M25552 Pain in left hip: Secondary | ICD-10-CM | POA: Insufficient documentation

## 2020-01-23 MED ORDER — ROPIVACAINE HCL 2 MG/ML IJ SOLN
9.0000 mL | Freq: Once | INTRAMUSCULAR | Status: AC
  Administered 2020-01-23: 9 mL via INTRA_ARTICULAR
  Filled 2020-01-23: qty 10

## 2020-01-23 MED ORDER — MIDAZOLAM HCL 5 MG/5ML IJ SOLN: 1.0000 mg | INTRAMUSCULAR | Status: DC | PRN

## 2020-01-23 MED ORDER — METHYLPREDNISOLONE ACETATE 80 MG/ML IJ SUSP
80.0000 mg | Freq: Once | INTRAMUSCULAR | Status: AC
  Administered 2020-01-23: 80 mg via INTRA_ARTICULAR
  Filled 2020-01-23: qty 1

## 2020-01-23 MED ORDER — LIDOCAINE HCL 2 % IJ SOLN
20.0000 mL | Freq: Once | INTRAMUSCULAR | Status: AC
  Administered 2020-01-23: 400 mg
  Filled 2020-01-23: qty 40

## 2020-01-23 MED ORDER — ROPIVACAINE HCL 2 MG/ML IJ SOLN
9.0000 mL | Freq: Once | INTRAMUSCULAR | Status: AC
  Administered 2020-01-23: 9 mL
  Filled 2020-01-23: qty 10

## 2020-01-23 MED ORDER — FENTANYL CITRATE (PF) 100 MCG/2ML IJ SOLN: 25.0000 ug | INTRAMUSCULAR | Status: DC | PRN

## 2020-01-23 MED ORDER — IOHEXOL 180 MG/ML  SOLN
10.0000 mL | Freq: Once | INTRAMUSCULAR | Status: AC
  Administered 2020-01-23: 10 mL via INTRA_ARTICULAR

## 2020-01-23 MED ORDER — METHYLPREDNISOLONE ACETATE 80 MG/ML IJ SUSP
80.0000 mg | Freq: Once | INTRAMUSCULAR | Status: AC
  Administered 2020-01-23: 80 mg
  Filled 2020-01-23: qty 1

## 2020-01-23 MED ORDER — LACTATED RINGERS IV SOLN: 1000.0000 mL | Freq: Once | INTRAVENOUS | Status: DC

## 2020-01-23 NOTE — Patient Instructions (Addendum)

## 2020-01-23 NOTE — Progress Notes (Signed)
Safety precautions to be maintained throughout the outpatient stay will include: orient to surroundings, keep bed in low position, maintain call bell within reach at all times, provide assistance with transfer out of bed and ambulation.  

## 2020-01-24 ENCOUNTER — Telehealth: Payer: Self-pay

## 2020-01-24 NOTE — Telephone Encounter (Signed)
Post procedure phone call. Patient states he is doing well.  

## 2020-01-25 ENCOUNTER — Other Ambulatory Visit: Payer: Medicare Other

## 2020-01-27 ENCOUNTER — Other Ambulatory Visit: Payer: Medicare Other | Admitting: Urology

## 2020-02-12 ENCOUNTER — Ambulatory Visit: Payer: Medicare Other | Admitting: Pain Medicine

## 2020-02-12 DIAGNOSIS — F1120 Opioid dependence, uncomplicated: Secondary | ICD-10-CM | POA: Insufficient documentation

## 2020-02-12 NOTE — Progress Notes (Deleted)
No show

## 2020-02-13 ENCOUNTER — Ambulatory Visit (INDEPENDENT_AMBULATORY_CARE_PROVIDER_SITE_OTHER): Payer: Medicare Other | Admitting: Urology

## 2020-02-13 ENCOUNTER — Other Ambulatory Visit: Payer: Self-pay

## 2020-02-13 ENCOUNTER — Encounter: Payer: Self-pay | Admitting: Urology

## 2020-02-13 VITALS — BP 135/89 | HR 69 | Ht 68.0 in | Wt 195.0 lb

## 2020-02-13 DIAGNOSIS — R3129 Other microscopic hematuria: Secondary | ICD-10-CM | POA: Diagnosis not present

## 2020-02-13 DIAGNOSIS — H6500 Acute serous otitis media, unspecified ear: Secondary | ICD-10-CM | POA: Diagnosis not present

## 2020-02-13 NOTE — Progress Notes (Signed)
   02/13/20  CC:  Chief Complaint  Patient presents with  . Cysto   Indications: High risk hematuria  HPI: Refer to Shannon's note 12/26/2019.  CTU without significant upper tract abnormalities  Blood pressure 135/89, pulse 69, height 5\' 8"  (1.727 m), weight 195 lb (88.5 kg). NED. A&Ox3.   No respiratory distress   Abd soft, NT, ND Normal phallus with bilateral descended testicles  Cystoscopy Procedure Note  Patient identification was confirmed, informed consent was obtained, and patient was prepped using Betadine solution.  Lidocaine jelly was administered per urethral meatus.     Pre-Procedure: - Inspection reveals a normal caliber urethral meatus.  Procedure: The flexible cystoscope was introduced without difficulty - No urethral strictures/lesions are present. - Moderate lateral lobe enlargement with prominent hypervascularity prostate  - Moderate median lobe without intravesical component - Bilateral ureteral orifices identified - Bladder mucosa  reveals no ulcers, tumors, or lesions - No bladder stones -Mild trabeculation  Retroflexion shows backbleeding from prostate, no intravesical median lobe   Post-Procedure: - Patient tolerated the procedure well  Assessment/ Plan:  No significant abnormalities on CTU  Cystoscopy without mucosal abnormalities; BPH with hypervascularity and friability which is the most likely source of his microhematuria  Follow-up with Larene Beach in 6 months   Abbie Sons, MD

## 2020-02-13 NOTE — Addendum Note (Signed)
Addended by: Chrystie Nose on: 02/13/2020 04:09 PM   Modules accepted: Orders

## 2020-02-27 DIAGNOSIS — J340 Abscess, furuncle and carbuncle of nose: Secondary | ICD-10-CM | POA: Diagnosis not present

## 2020-02-27 DIAGNOSIS — H6981 Other specified disorders of Eustachian tube, right ear: Secondary | ICD-10-CM | POA: Diagnosis not present

## 2020-02-28 ENCOUNTER — Telehealth: Payer: Self-pay

## 2020-02-28 DIAGNOSIS — F411 Generalized anxiety disorder: Secondary | ICD-10-CM

## 2020-02-28 DIAGNOSIS — F338 Other recurrent depressive disorders: Secondary | ICD-10-CM

## 2020-02-28 MED ORDER — VENLAFAXINE HCL ER 75 MG PO CP24: 75.0000 mg | ORAL_CAPSULE | Freq: Every day | ORAL | 3 refills | Status: DC

## 2020-02-28 NOTE — Telephone Encounter (Signed)
Pt presented in office to have venlafaxine xr 75 mg refilled and sent to [facility].

## 2020-04-20 DIAGNOSIS — Z86018 Personal history of other benign neoplasm: Secondary | ICD-10-CM | POA: Diagnosis not present

## 2020-04-20 DIAGNOSIS — L570 Actinic keratosis: Secondary | ICD-10-CM | POA: Diagnosis not present

## 2020-04-20 DIAGNOSIS — L280 Lichen simplex chronicus: Secondary | ICD-10-CM | POA: Diagnosis not present

## 2020-04-20 DIAGNOSIS — Z859 Personal history of malignant neoplasm, unspecified: Secondary | ICD-10-CM | POA: Diagnosis not present

## 2020-04-20 DIAGNOSIS — L578 Other skin changes due to chronic exposure to nonionizing radiation: Secondary | ICD-10-CM | POA: Diagnosis not present

## 2020-04-20 DIAGNOSIS — Z872 Personal history of diseases of the skin and subcutaneous tissue: Secondary | ICD-10-CM | POA: Diagnosis not present

## 2020-04-20 DIAGNOSIS — L920 Granuloma annulare: Secondary | ICD-10-CM | POA: Diagnosis not present

## 2020-04-29 ENCOUNTER — Telehealth: Payer: Self-pay | Admitting: Family Medicine

## 2020-04-29 NOTE — Telephone Encounter (Signed)
Pt advised per CRM office not accepting pt. Pt thanks for our time.

## 2020-04-30 ENCOUNTER — Other Ambulatory Visit: Payer: Self-pay | Admitting: Pain Medicine

## 2020-04-30 DIAGNOSIS — M7918 Myalgia, other site: Secondary | ICD-10-CM

## 2020-04-30 DIAGNOSIS — M25562 Pain in left knee: Secondary | ICD-10-CM

## 2020-04-30 DIAGNOSIS — M79652 Pain in left thigh: Secondary | ICD-10-CM

## 2020-04-30 DIAGNOSIS — M79605 Pain in left leg: Secondary | ICD-10-CM

## 2020-04-30 DIAGNOSIS — M5137 Other intervertebral disc degeneration, lumbosacral region: Secondary | ICD-10-CM

## 2020-04-30 DIAGNOSIS — G8929 Other chronic pain: Secondary | ICD-10-CM

## 2020-04-30 DIAGNOSIS — M25552 Pain in left hip: Secondary | ICD-10-CM

## 2020-04-30 NOTE — Progress Notes (Signed)
Patient: Matthew Maldonado  Service Category: E/M  Provider: Gaspar Cola, MD  DOB: April 28, 1950  DOS: 04/30/2020  Location: Office  MRN: [id number]  Setting: Ambulatory outpatient  Referring Provider: No ref. provider found  Type: Established Patient  Specialty: Interventional Pain Management  PCP: Olin Hauser, DO  Location: Remote location  Delivery: TeleHealth     Patient called today indicating that he is having recurrence of his pain and he would like to come in for some epidurals.  Assessment  The primary encounter diagnosis was Chronic pain of lower extremity (1ry area of Pain) (Bilateral) (L>R). Diagnoses of Chronic buttock pain (2ry area of Pain) (Bilateral) (L>R), Chronic hip pain (3ry area of Pain) (Bilateral) (L>R), Chronic thigh pain (4th area of Pain) (Left), Chronic knee pain (5th area of Pain) (Left), and DDD (degenerative disc disease), lumbosacral were also pertinent to this visit.  Plan of Care  Orders:  Orders Placed This Encounter  Procedures  . Lumbar Epidural Injection    Standing Status:   Future    Standing Expiration Date:   05/28/2020    Scheduling Instructions:     Procedure: Interlaminar Lumbar Epidural Steroid injection (LESI)  L3-4     Laterality: Left-sided     Sedation: None required.     Timeframe: ASAP    Order Specific Question:   Where will this procedure be performed?    Answer:   [facility]   Follow-up plan:   No follow-ups on file.  Communication sent to front staff to schedule patient.     Interventional Therapies  Risk  Complexity Considerations:   WNL   Planned  Pending:   Therapeutic left L3-4 LESI #4    Under consideration:   Palliative treatments   Completed:   Therapeutic bilateral IA hip joint injection x2 (01/23/2020) Therapeutic bilateral gluteofemoral bursa injection x2 (01/23/2020) Therapeutic/palliative left L3-4 LESI x3 (07/09/2019)   Therapeutic  Palliative (PRN) options:   Therapeutic bilateral  IA hip joint injection #2  Therapeutic bilateral gluteal femoral bursa injection #2  Therapeutic/palliative left L3-4 LESI #4     Recent Visits No visits were found meeting these conditions. Showing recent visits within past 90 days and meeting all other requirements Future Appointments No visits were found meeting these conditions. Showing future appointments within next 90 days and meeting all other requirements  Note by: Gaspar Cola, MD Date: 04/30/2020; Time: 2:24 PM

## 2020-05-05 ENCOUNTER — Other Ambulatory Visit: Payer: Self-pay

## 2020-05-05 ENCOUNTER — Encounter: Payer: Self-pay | Admitting: Pain Medicine

## 2020-05-05 ENCOUNTER — Ambulatory Visit (HOSPITAL_BASED_OUTPATIENT_CLINIC_OR_DEPARTMENT_OTHER): Payer: Medicare Other | Admitting: Pain Medicine

## 2020-05-05 ENCOUNTER — Ambulatory Visit
Admission: RE | Admit: 2020-05-05 | Discharge: 2020-05-05 | Disposition: A | Discharge status: Home / Self Care | Payer: Medicare Other | Source: Ambulatory Visit | Attending: Pain Medicine | Admitting: Pain Medicine

## 2020-05-05 VITALS — BP 131/90 | HR 59 | Temp 97.1°F | Resp 15 | Ht 68.0 in | Wt 200.0 lb

## 2020-05-05 DIAGNOSIS — M5137 Other intervertebral disc degeneration, lumbosacral region: Secondary | ICD-10-CM | POA: Insufficient documentation

## 2020-05-05 DIAGNOSIS — G8929 Other chronic pain: Secondary | ICD-10-CM

## 2020-05-05 DIAGNOSIS — M5442 Lumbago with sciatica, left side: Secondary | ICD-10-CM

## 2020-05-05 DIAGNOSIS — M79605 Pain in left leg: Secondary | ICD-10-CM | POA: Diagnosis not present

## 2020-05-05 DIAGNOSIS — M5416 Radiculopathy, lumbar region: Secondary | ICD-10-CM | POA: Diagnosis not present

## 2020-05-05 DIAGNOSIS — M79604 Pain in right leg: Secondary | ICD-10-CM | POA: Diagnosis not present

## 2020-05-05 DIAGNOSIS — M5441 Lumbago with sciatica, right side: Secondary | ICD-10-CM | POA: Insufficient documentation

## 2020-05-05 MED ORDER — SODIUM CHLORIDE (PF) 0.9 % IJ SOLN
INTRAMUSCULAR | Status: AC
  Filled 2020-05-05: qty 10

## 2020-05-05 MED ORDER — TRIAMCINOLONE ACETONIDE 40 MG/ML IJ SUSP
40.0000 mg | Freq: Once | INTRAMUSCULAR | Status: AC
  Administered 2020-05-05: 40 mg

## 2020-05-05 MED ORDER — TRIAMCINOLONE ACETONIDE 40 MG/ML IJ SUSP
INTRAMUSCULAR | Status: AC
  Filled 2020-05-05: qty 1

## 2020-05-05 MED ORDER — SODIUM CHLORIDE 0.9% FLUSH
2.0000 mL | Freq: Once | INTRAVENOUS | Status: AC
  Administered 2020-05-05: 2 mL

## 2020-05-05 MED ORDER — ROPIVACAINE HCL 2 MG/ML IJ SOLN
INTRAMUSCULAR | Status: AC
  Filled 2020-05-05: qty 10

## 2020-05-05 MED ORDER — LIDOCAINE HCL (PF) 2 % IJ SOLN
INTRAMUSCULAR | Status: AC
  Filled 2020-05-05: qty 10

## 2020-05-05 MED ORDER — IOHEXOL 180 MG/ML  SOLN
10.0000 mL | Freq: Once | INTRAMUSCULAR | Status: AC
  Administered 2020-05-05: 10 mL via EPIDURAL

## 2020-05-05 MED ORDER — ROPIVACAINE HCL 2 MG/ML IJ SOLN
2.0000 mL | Freq: Once | INTRAMUSCULAR | Status: AC
  Administered 2020-05-05: 2 mL via EPIDURAL

## 2020-05-05 MED ORDER — LIDOCAINE HCL 2 % IJ SOLN
20.0000 mL | Freq: Once | INTRAMUSCULAR | Status: AC
  Administered 2020-05-05: 100 mg
  Filled 2020-05-05: qty 20

## 2020-05-05 NOTE — Patient Instructions (Addendum)
____________________________________________________________________________________________  Post-Procedure Discharge Instructions  Instructions:  Apply ice:   Purpose: This will minimize any swelling and discomfort after procedure.   When: Day of procedure, as soon as you get home.  How: Fill a plastic sandwich bag with crushed ice. Cover it with a small towel and apply to injection site.  How long: (15 min on, 15 min off) Apply for 15 minutes then remove x 15 minutes.  Repeat sequence on day of procedure, until you go to bed.  Apply heat:   Purpose: To treat any soreness and discomfort from the procedure.  When: Starting the next day after the procedure.  How: Apply heat to procedure site starting the day following the procedure.  How long: May continue to repeat daily, until discomfort goes away.  Food intake: Start with clear liquids (like water) and advance to regular food, as tolerated.   Physical activities: Keep activities to a minimum for the first 8 hours after the procedure. After that, then as tolerated.  Driving: If you have received any sedation, be responsible and do not drive. You are not allowed to drive for 24 hours after having sedation.  Blood thinner: (Applies only to those taking blood thinners) You may restart your blood thinner 6 hours after your procedure.  Insulin: (Applies only to Diabetic patients taking insulin) As soon as you can eat, you may resume your normal dosing schedule.  Infection prevention: Keep procedure site clean and dry. Shower daily and clean area with soap and water.  Post-procedure Pain Diary: Extremely important that this be done correctly and accurately. Recorded information will be used to determine the next step in treatment. For the purpose of accuracy, follow these rules:  Evaluate only the area treated. Do not report or include pain from an untreated area. For the purpose of this evaluation, ignore all other areas of pain,  except for the treated area.  After your procedure, avoid taking a long nap and attempting to complete the pain diary after you wake up. Instead, set your alarm clock to go off every hour, on the hour, for the initial 8 hours after the procedure. Document the duration of the numbing medicine, and the relief you are getting from it.  Do not go to sleep and attempt to complete it later. It will not be accurate. If you received sedation, it is likely that you were given a medication that may cause amnesia. Because of this, completing the diary at a later time may cause the information to be inaccurate. This information is needed to plan your care.  Follow-up appointment: Keep your post-procedure follow-up evaluation appointment after the procedure (usually 2 weeks for most procedures, 6 weeks for radiofrequencies). DO NOT FORGET to bring you pain diary with you.   Expect: (What should I expect to see with my procedure?)  From numbing medicine (AKA: Local Anesthetics): Numbness or decrease in pain. You may also experience some weakness, which if present, could last for the duration of the local anesthetic.  Onset: Full effect within 15 minutes of injected.  Duration: It will depend on the type of local anesthetic used. On the average, 1 to 8 hours.   From steroids (Applies only if steroids were used): Decrease in swelling or inflammation. Once inflammation is improved, relief of the pain will follow.  Onset of benefits: Depends on the amount of swelling present. The more swelling, the longer it will take for the benefits to be seen. In some cases, up to 10 days.    Duration: Steroids will stay in the system x 2 weeks. Duration of benefits will depend on multiple posibilities including persistent irritating factors.  Side-effects: If present, they may typically last 2 weeks (the duration of the steroids).  Frequent: Cramps (if they occur, drink Gatorade and take over-the-counter Magnesium 450-500 mg  once to twice a day); water retention with temporary weight gain; increases in blood sugar; decreased immune system response; increased appetite.  Occasional: Facial flushing (red, warm cheeks); mood swings; menstrual changes.  Uncommon: Long-term decrease or suppression of natural hormones; bone thinning. (These are more common with higher doses or more frequent use. This is why we prefer that our patients avoid having any injection therapies in other practices.)   Very Rare: Severe mood changes; psychosis; aseptic necrosis.  From procedure: Some discomfort is to be expected once the numbing medicine wears off. This should be minimal if ice and heat are applied as instructed.  Call if: (When should I call?)  You experience numbness and weakness that gets worse with time, as opposed to wearing off.  New onset bowel or bladder incontinence. (Applies only to procedures done in the spine)  Emergency Numbers:  Durning business hours (Monday - Thursday, 8:00 AM - 4:00 PM) (Friday, 9:00 AM - 12:00 Noon): [phone]  After hours: [phone]  NOTE: If you are having a problem and are unable connect with, or to talk to a provider, then go to your nearest urgent care or emergency department. If the problem is serious and urgent, please call 911. ____________________________________________________________________________________________   Pain Management Discharge Instructions  General Discharge Instructions :  If you need to reach your doctor call: Monday-Friday 8:00 am - 4:00 pm at [phone] or toll free [phone].  After clinic hours [phone] to have operator reach doctor.  Bring all of your medication bottles to all your appointments in the pain clinic.  To cancel or reschedule your appointment with Pain Management please remember to call 24 hours in advance to avoid a fee.  Refer to the educational materials which you have been given on: General Risks, I had my  Procedure. Discharge Instructions, Post Sedation.  Post Procedure Instructions:  The drugs you were given will stay in your system until tomorrow, so for the next 24 hours you should not drive, make any legal decisions or drink any alcoholic beverages.  You may eat anything you prefer, but it is better to start with liquids then soups and crackers, and gradually work up to solid foods.  Please notify your doctor immediately if you have any unusual bleeding, trouble breathing or pain that is not related to your normal pain.  Depending on the type of procedure that was done, some parts of your body may feel week and/or numb.  This usually clears up by tonight or the next day.  Walk with the use of an assistive device or accompanied by an adult for the 24 hours.  You may use ice on the affected area for the first 24 hours.  Put ice in a Ziploc bag and cover with a towel and place against area 15 minutes on 15 minutes off.  You may switch to heat after 24 hours.Epidural Steroid Injection Patient Information  Description: The epidural space surrounds the nerves as they exit the spinal cord.  In some patients, the nerves can be compressed and inflamed by a bulging disc or a tight spinal canal (spinal stenosis).  By injecting steroids into the epidural space, we can bring irritated nerves   into direct contact with a potentially helpful medication.  These steroids act directly on the irritated nerves and can reduce swelling and inflammation which often leads to decreased pain.  Epidural steroids may be injected anywhere along the spine and from the neck to the low back depending upon the location of your pain.   After numbing the skin with local anesthetic (like Novocaine), a small needle is passed into the epidural space slowly.  You may experience a sensation of pressure while this is being done.  The entire block usually last less than 10 minutes.  Conditions which may be treated by epidural  steroids:   Low back and leg pain  Neck and arm pain  Spinal stenosis  Post-laminectomy syndrome  Herpes zoster (shingles) pain  Pain from compression fractures  Preparation for the injection:  1. Do not eat any solid food or dairy products within 8 hours of your appointment.  2. You may drink clear liquids up to 3 hours before appointment.  Clear liquids include water, black coffee, juice or soda.  No milk or cream please. 3. You may take your regular medication, including pain medications, with a sip of water before your appointment  Diabetics should hold regular insulin (if taken separately) and take 1/2 normal NPH dos the morning of the procedure.  Carry some sugar containing items with you to your appointment. 4. A driver must accompany you and be prepared to drive you home after your procedure.  5. Bring all your current medications with your. 6. An IV may be inserted and sedation may be given at the discretion of the physician.   7. A blood pressure cuff, EKG and other monitors will often be applied during the procedure.  Some patients may need to have extra oxygen administered for a short period. 8. You will be asked to provide medical information, including your allergies, prior to the procedure.  We must know immediately if you are taking blood thinners (like Coumadin/Warfarin)  Or if you are allergic to IV iodine contrast (dye). We must know if you could possible be pregnant.  Possible side-effects:  Bleeding from needle site  Infection (rare, may require surgery)  Nerve injury (rare)  Numbness & tingling (temporary)  Difficulty urinating (rare, temporary)  Spinal headache ( a headache worse with upright posture)  Light -headedness (temporary)  Pain at injection site (several days)  Decreased blood pressure (temporary)  Weakness in arm/leg (temporary)  Pressure sensation in back/neck (temporary)  Call if you experience:  Fever/chills associated with  headache or increased back/neck pain.  Headache worsened by an upright position.  New onset weakness or numbness of an extremity below the injection site  Hives or difficulty breathing (go to the emergency room)  Inflammation or drainage at the infection site  Severe back/neck pain  Any new symptoms which are concerning to you  Please note:  Although the local anesthetic injected can often make your back or neck feel good for several hours after the injection, the pain will likely return.  It takes 3-7 days for steroids to work in the epidural space.  You may not notice any pain relief for at least that one week.  If effective, we will often do a series of three injections spaced 3-6 weeks apart to maximally decrease your pain.  After the initial series, we generally will wait several months before considering a repeat injection of the same type.  If you have any questions, please call [phone] [facility] 

## 2020-05-05 NOTE — Progress Notes (Signed)
PROVIDER NOTE: Information contained herein reflects review and annotations entered in association with encounter. Interpretation of such information and data should be left to medically-trained personnel. Information provided to patient can be located elsewhere in the medical record under "Patient Instructions". Document created using STT-dictation technology, any transcriptional errors that may result from process are unintentional.    Patient: Matthew Maldonado  Service Category: Procedure  Provider: Gaspar Cola, MD  DOB: 24-Nov-1950  DOS: 05/05/2020  Location: [facility]  MRN: [id number]  Setting: Ambulatory - outpatient  Referring Provider: Nobie Putnam *  Type: Established Patient  Specialty: Interventional Pain Management  PCP: Olin Hauser, DO   Primary Reason for Visit: Interventional Pain Management Treatment. CC: Back Pain  Procedure:          Anesthesia, Analgesia, Anxiolysis:  Type: Therapeutic Inter-Laminar Epidural Steroid Injection  #4  Region: Lumbar Level: L3-4 Level. Laterality: Left-Sided Paramedial  Type: Local Anesthesia Indication(s): Analgesia         Route: Infiltration (Notus/IM) IV Access: Declined Sedation: Declined  Local Anesthetic: Lidocaine 1-2%  Position: Prone with head of the table was raised to facilitate breathing.   Indications: 1. Chronic pain of lower extremity (1ry area of Pain) (Bilateral) (L>R)   2. Chronic lumbar radiculopathy (Left)   3. DDD (degenerative disc disease), lumbosacral   4. Chronic low back pain (Bilateral) w/ sciatica (Bilateral) (L>R)    Pain Score: Pre-procedure: 7 /10 Post-procedure: 0-No pain/10   Pre-op H&P Assessment:  Matthew Maldonado is a 70 y.o. (year old), male patient, seen today for interventional treatment. He  has a past surgical history that includes Renal biopsy; Nasal septoplasty w/ turbinoplasty (Bilateral, 03/22/2018); Nasopharyngoscopy eustation tube balloon dilation (Bilateral,  03/22/2018); Nasal endoscopy (Bilateral, 03/22/2018); Excision nasal mass (Left, 03/22/2018); and Colonoscopy with propofol (N/A, 04/04/2019). Matthew Maldonado has a current medication list which includes the following prescription(s): atorvastatin, buspirone, diclofenac sodium, esomeprazole, fluticasone, ketoconazole, metoprolol succinate, multivitamin with minerals, pregabalin, diclofenac sodium, oxycodone, and venlafaxine xr. His primarily concern today is the Back Pain  Initial Vital Signs:  Pulse/HCG Rate: (!) 59ECG Heart Rate: (!) 56 Temp: (!) 97.1 F (36.2 C) Resp: 16 BP: 124/85 SpO2: 98 %  BMI: Estimated body mass index is 30.41 kg/m as calculated from the following:   Height as of this encounter: 5\' 8"  (1.727 m).   Weight as of this encounter: 200 lb (90.7 kg).  Risk Assessment: Allergies: Reviewed. He has No Known Allergies.  Allergy Precautions: None required Coagulopathies: Reviewed. None identified.  Blood-thinner therapy: None at this time Active Infection(s): Reviewed. None identified. Matthew Maldonado is afebrile  Site Confirmation: Matthew Maldonado was asked to confirm the procedure and laterality before marking the site Procedure checklist: Completed Consent: Before the procedure and under the influence of no sedative(s), amnesic(s), or anxiolytics, the patient was informed of the treatment options, risks and possible complications. To fulfill our ethical and legal obligations, as recommended by the American Medical Association's Code of Ethics, I have informed the patient of my clinical impression; the nature and purpose of the treatment or procedure; the risks, benefits, and possible complications of the intervention; the alternatives, including doing nothing; the risk(s) and benefit(s) of the alternative treatment(s) or procedure(s); and the risk(s) and benefit(s) of doing nothing. The patient was provided information about the general risks and possible complications associated with the procedure.  These may include, but are not limited to: failure to achieve desired goals, infection, bleeding, organ or nerve damage, allergic reactions,  paralysis, and death. In addition, the patient was informed of those risks and complications associated to Spine-related procedures, such as failure to decrease pain; infection (i.e.: Meningitis, epidural or intraspinal abscess); bleeding (i.e.: epidural hematoma, subarachnoid hemorrhage, or any other type of intraspinal or peri-dural bleeding); organ or nerve damage (i.e.: Any type of peripheral nerve, nerve root, or spinal cord injury) with subsequent damage to sensory, motor, and/or autonomic systems, resulting in permanent pain, numbness, and/or weakness of one or several areas of the body; allergic reactions; (i.e.: anaphylactic reaction); and/or death. Furthermore, the patient was informed of those risks and complications associated with the medications. These include, but are not limited to: allergic reactions (i.e.: anaphylactic or anaphylactoid reaction(s)); adrenal axis suppression; blood sugar elevation that in diabetics may result in ketoacidosis or comma; water retention that in patients with history of congestive heart failure may result in shortness of breath, pulmonary edema, and decompensation with resultant heart failure; weight gain; swelling or edema; medication-induced neural toxicity; particulate matter embolism and blood vessel occlusion with resultant organ, and/or nervous system infarction; and/or aseptic necrosis of one or more joints. Finally, the patient was informed that Medicine is not an exact science; therefore, there is also the possibility of unforeseen or unpredictable risks and/or possible complications that may result in a catastrophic outcome. The patient indicated having understood very clearly. We have given the patient no guarantees and we have made no promises. Enough time was given to the patient to ask questions, all of which were  answered to the patient's satisfaction. Matthew Maldonado has indicated that he wanted to continue with the procedure. Attestation: I, the ordering provider, attest that I have discussed with the patient the benefits, risks, side-effects, alternatives, likelihood of achieving goals, and potential problems during recovery for the procedure that I have provided informed consent. Date  Time: 05/05/2020 10:52 AM  Pre-Procedure Preparation:  Monitoring: As per clinic protocol. Respiration, ETCO2, SpO2, BP, heart rate and rhythm monitor placed and checked for adequate function Safety Precautions: Patient was assessed for positional comfort and pressure points before starting the procedure. Time-out: I initiated and conducted the "Time-out" before starting the procedure, as per protocol. The patient was asked to participate by confirming the accuracy of the "Time Out" information. Verification of the correct person, site, and procedure were performed and confirmed by me, the nursing staff, and the patient. "Time-out" conducted as per Joint Commission's Universal Protocol (UP.01.01.01). Time: 1127  Description of Procedure:          Target Area: The interlaminar space, initially targeting the lower laminar border of the superior vertebral body. Approach: Paramedial approach. Area Prepped: Entire Posterior Lumbar Region DuraPrep (Iodine Povacrylex [0.7% available iodine] and Isopropyl Alcohol, 74% w/w) Safety Precautions: Aspiration looking for blood return was conducted prior to all injections. At no point did we inject any substances, as a needle was being advanced. No attempts were made at seeking any paresthesias. Safe injection practices and needle disposal techniques used. Medications properly checked for expiration dates. SDV (single dose vial) medications used. Description of the Procedure: Protocol guidelines were followed. The procedure needle was introduced through the skin, ipsilateral to the reported  pain, and advanced to the target area. Bone was contacted and the needle walked caudad, until the lamina was cleared. The epidural space was identified using "loss-of-resistance technique" with 2-3 ml of PF-NaCl (0.9% NSS), in a 5cc LOR glass syringe.  Vitals:   05/05/20 1050 05/05/20 1121 05/05/20 1128 05/05/20 1133  BP: 124/85 (!) 134/92 Marland Kitchen)  133/94 131/90  Pulse: (!) 59     Resp: 16 14 15 15   Temp: (!) 97.1 F (36.2 C)     TempSrc: Temporal     SpO2: 98% 96% 95% 95%  Weight: 200 lb (90.7 kg)     Height: 5\' 8"  (1.727 m)       Start Time: 1127 hrs. End Time: 1133 hrs.  Materials:  Needle(s) Type: Epidural needle Gauge: 17G Length: 3.5-in Medication(s): Please see orders for medications and dosing details.  Imaging Guidance (Spinal):          Type of Imaging Technique: Fluoroscopy Guidance (Spinal) Indication(s): Assistance in needle guidance and placement for procedures requiring needle placement in or near specific anatomical locations not easily accessible without such assistance. Exposure Time: Please see nurses notes. Contrast: Before injecting any contrast, we confirmed that the patient did not have an allergy to iodine, shellfish, or radiological contrast. Once satisfactory needle placement was completed at the desired level, radiological contrast was injected. Contrast injected under live fluoroscopy. No contrast complications. See chart for type and volume of contrast used. Fluoroscopic Guidance: I was personally present during the use of fluoroscopy. "Tunnel Vision Technique" used to obtain the best possible view of the target area. Parallax error corrected before commencing the procedure. "Direction-depth-direction" technique used to introduce the needle under continuous pulsed fluoroscopy. Once target was reached, antero-posterior, oblique, and lateral fluoroscopic projection used confirm needle placement in all planes. Images permanently stored in EMR. Interpretation: I  personally interpreted the imaging intraoperatively. Adequate needle placement confirmed in multiple planes. Appropriate spread of contrast into desired area was observed. No evidence of afferent or efferent intravascular uptake. No intrathecal or subarachnoid spread observed. Permanent images saved into the patient's record.  Antibiotic Prophylaxis:   Anti-infectives (From admission, onward)   None     Indication(s): None identified  Post-operative Assessment:  Post-procedure Vital Signs:  Pulse/HCG Rate: (!) 59(!) 56 Temp: (!) 97.1 F (36.2 C) Resp: 15 BP: 131/90 SpO2: 95 %  EBL: None  Complications: No immediate post-treatment complications observed by team, or reported by patient.  Note: The patient tolerated the entire procedure well. A repeat set of vitals were taken after the procedure and the patient was kept under observation following institutional policy, for this type of procedure. Post-procedural neurological assessment was performed, showing return to baseline, prior to discharge. The patient was provided with post-procedure discharge instructions, including a section on how to identify potential problems. Should any problems arise concerning this procedure, the patient was given instructions to immediately contact us, at any time, without hesitation. In any case, we plan to contact the patient by telephone for a follow-up status report regarding this interventional procedure.  Comments:  No additional relevant information.  Plan of Care  Orders:  Orders Placed This Encounter  Procedures  . Lumbar Epidural Injection    Scheduling Instructions:     Procedure: Interlaminar LESI L3-4     Laterality: Left-sided     Sedation: No Sedation     Timeframe:  Today    Order Specific Question:   Where will this procedure be performed?    Answer:   [facility]  . DG [facility] C-ARM 1-60 MIN NO REPORT    Intraoperative interpretation by procedural physician at  [place].    Standing Status:   Standing    Number of Occurrences:   1    Order Specific Question:   Reason for exam:    Answer:  Assistance in needle guidance and placement for procedures requiring needle placement in or near specific anatomical locations not easily accessible without such assistance.  . Informed Consent Details: Physician/Practitioner Attestation; Transcribe to consent Maldonado and obtain patient signature    Note: Always confirm laterality of pain with Mr. Heinz, before procedure. Transcribe to consent Maldonado and obtain patient signature.    Order Specific Question:   Physician/Practitioner attestation of informed consent for procedure/surgical case    Answer:   I, the physician/practitioner, attest that I have discussed with the patient the benefits, risks, side effects, alternatives, likelihood of achieving goals and potential problems during recovery for the procedure that I have provided informed consent.    Order Specific Question:   Procedure    Answer:   Lumbar epidural steroid injection under fluoroscopic guidance    Order Specific Question:   Physician/Practitioner performing the procedure    Answer:   Kiegan Macaraeg A. Dossie Arbour, MD    Order Specific Question:   Indication/Reason    Answer:   Low back and/or lower extremity pain secondary to lumbar radiculitis  . Provide equipment / supplies at bedside    "Epidural Tray" (Disposable  single use) Catheter: NOT required    Standing Status:   Standing    Number of Occurrences:   1    Order Specific Question:   Specify    Answer:   Epidural Tray   Chronic Opioid Analgesic:  Oxycodone IR 5 mg, 1-2 tabs PO HS (10 mg/day of oxycodone) MME/day: 15 mg/day.   Medications ordered for procedure: Meds ordered this encounter  Medications  . lidocaine (XYLOCAINE) 2 % (with pres) injection 400 mg  . sodium chloride flush (NS) 0.9 % injection 2 mL  . ropivacaine (PF) 2 mg/mL (0.2%) (NAROPIN) injection 2 mL  .  triamcinolone acetonide (KENALOG-40) injection 40 mg  . iohexol (OMNIPAQUE) 180 MG/ML injection 10 mL    Must be Myelogram-compatible. If not available, you may substitute with a water-soluble, non-ionic, hypoallergenic, myelogram-compatible radiological contrast medium.   Medications administered: We administered lidocaine, sodium chloride flush, ropivacaine (PF) 2 mg/mL (0.2%), triamcinolone acetonide, and iohexol.  See the medical record for exact dosing, route, and time of administration.  Follow-up plan:   Return in about 2 weeks (around 05/19/2020) for (VIrtual), (PP) Follow-up.       Interventional Therapies  Risk  Complexity Considerations:   WNL   Planned  Pending:   Therapeutic left L3-4 LESI #4    Under consideration:   Palliative treatments   Completed:   Therapeutic bilateral IA hip joint injection x2 (01/23/2020) Therapeutic bilateral gluteofemoral bursa injection x2 (01/23/2020) Therapeutic/palliative left L3-4 LESI x3 (07/09/2019)   Therapeutic  Palliative (PRN) options:   Therapeutic bilateral IA hip joint injection #2  Therapeutic bilateral gluteal femoral bursa injection #2  Therapeutic/palliative left L3-4 LESI #4      Recent Visits No visits were found meeting these conditions. Showing recent visits within past 90 days and meeting all other requirements Today's Visits Date Type Provider Dept  05/05/20 Procedure visit [name], MD [facility]  Showing today's visits and meeting all other requirements Future Appointments Date Type Provider Dept  05/13/20 Appointment [name], MD [facility]  Showing future appointments within next 90 days and meeting all other requirements  Disposition: Discharge home  Discharge (Date  Time): 05/05/2020; 1142 hrs.   Primary Care Physician: Olin Hauser, DO Location: [facility] Note by: Gaspar Cola, MD Date: 05/05/2020;  Time:  12:19 PM  Disclaimer:  Medicine is not an Chief Strategy Officer. The only guarantee in medicine is that nothing is guaranteed. It is important to note that the decision to proceed with this intervention was based on the information collected from the patient. The Data and conclusions were drawn from the patient's questionnaire, the interview, and the physical examination. Because the information was provided in large part by the patient, it cannot be guaranteed that it has not been purposely or unconsciously manipulated. Every effort has been made to obtain as much relevant data as possible for this evaluation. It is important to note that the conclusions that lead to this procedure are derived in large part from the available data. Always take into account that the treatment will also be dependent on availability of resources and existing treatment guidelines, considered by other Pain Management Practitioners as being common knowledge and practice, at the time of the intervention. For Medico-Legal purposes, it is also important to point out that variation in procedural techniques and pharmacological choices are the acceptable norm. The indications, contraindications, technique, and results of the above procedure should only be interpreted and judged by a Board-Certified Interventional Pain Specialist with extensive familiarity and expertise in the same exact procedure and technique.

## 2020-05-05 NOTE — Progress Notes (Signed)
Safety precautions to be maintained throughout the outpatient stay will include: orient to surroundings, keep bed in low position, maintain call bell within reach at all times, provide assistance with transfer out of bed and ambulation.  

## 2020-05-06 ENCOUNTER — Telehealth: Payer: Self-pay

## 2020-05-06 NOTE — Telephone Encounter (Signed)
Called pp. No answer. Left message to call if needed.

## 2020-05-08 DIAGNOSIS — M19012 Primary osteoarthritis, left shoulder: Secondary | ICD-10-CM | POA: Diagnosis not present

## 2020-05-08 DIAGNOSIS — M25511 Pain in right shoulder: Secondary | ICD-10-CM | POA: Diagnosis not present

## 2020-05-08 DIAGNOSIS — S4991XA Unspecified injury of right shoulder and upper arm, initial encounter: Secondary | ICD-10-CM | POA: Diagnosis not present

## 2020-05-08 DIAGNOSIS — M19011 Primary osteoarthritis, right shoulder: Secondary | ICD-10-CM | POA: Diagnosis not present

## 2020-05-11 ENCOUNTER — Telehealth: Payer: Self-pay | Admitting: Pain Medicine

## 2020-05-11 NOTE — Telephone Encounter (Signed)
Patient is having sciatic pain unrelieved by injections, [place] to go out of the country on Friday and needs some thing to help before that. He has appt on 05-13-20 but says he needs something before that.

## 2020-05-11 NOTE — Telephone Encounter (Signed)
Please call patient to schedule appt 

## 2020-05-12 NOTE — Progress Notes (Addendum)
Patient: Matthew Maldonado  Service Category: E/M  Provider: Gaspar Cola, MD  DOB: [birth year]  DOS: 05/13/2020  Location: Office  MRN: [id number]  Setting: Ambulatory outpatient  Referring Provider: Nobie Putnam *  Type: Established Patient  Specialty: Interventional Pain Management  PCP: Olin Hauser, DO  Location: Remote location  Delivery: TeleHealth     Virtual Encounter - Pain Management PROVIDER NOTE: Information contained herein reflects review and annotations entered in association with encounter. Interpretation of such information and data should be left to medically-trained personnel. Information provided to patient can be located elsewhere in the medical record under "Patient Instructions". Document created using STT-dictation technology, any transcriptional errors that may result from process are unintentional.    Contact & Pharmacy Preferred: [phone] Home: [phone] (home) Mobile: [phone] (mobile) E-mail: [email or web address]  [address], [place]. [place] [address] Phone: [phone] Fax: [phone]   Pre-screening  Matthew Maldonado offered "in-person" vs "virtual" encounter. He indicated preferring virtual for this encounter.   Reason COVID-19*  Social distancing based on CDC and AMA recommendations.   I contacted Matthew Maldonado on 05/13/2020 via telephone.      I clearly identified myself as Gaspar Cola, MD. I verified that I was speaking with the correct person using two identifiers (Name: Matthew Maldonado, and date of birth: 1950-10-05).  Consent I sought verbal advanced consent from Matthew Maldonado for virtual visit interactions. I informed Matthew Maldonado of possible security and privacy concerns, risks, and limitations associated with providing "not-in-person" medical evaluation and management services. I also informed Matthew Maldonado of the availability of "in-person" appointments. Finally, I informed him that  there would be a charge for the virtual visit and that he could be  personally, fully or partially, financially responsible for it. Matthew Maldonado expressed understanding and agreed to proceed.   Historic Elements   Matthew Maldonado is a 70 y.o. year old, male patient evaluated today after our last contact on 05/11/2020. Matthew Maldonado  has a past medical history of Anxiety, Arthritis, Blood vessel disorder, BPH (benign prostatic hyperplasia), GERD (gastroesophageal reflux disease), Insomnia, Minimal change disease, Nocturia, PSVT (paroxysmal supraventricular tachycardia) ([place]), and TIA (transient ischemic attack). He also  has a past surgical history that includes Renal biopsy; Nasal septoplasty w/ turbinoplasty (Bilateral, 03/22/2018); Nasopharyngoscopy eustation tube balloon dilation (Bilateral, 03/22/2018); Nasal endoscopy (Bilateral, 03/22/2018); Excision nasal mass (Left, 03/22/2018); and Colonoscopy with propofol (N/A, 04/04/2019). Matthew Maldonado has a current medication list which includes the following prescription(s): atorvastatin, buspirone, diclofenac sodium, esomeprazole, fluticasone, ketoconazole, metoprolol succinate, multivitamin with minerals, pregabalin, venlafaxine xr, diclofenac sodium, and oxycodone. He  reports that he has quit smoking. His smoking use included cigars. He quit after 4.00 years of use. He has quit using smokeless tobacco. He reports current alcohol use of about 18.0 standard drinks of alcohol per week. He reports that he does not use drugs. Matthew Maldonado has No Known Allergies.   HPI  Today, he is being contacted for a post-procedure assessment.  On the day of the procedure, by the time he had left the clinic he indicated having 100% relief of the pain.  Specifically, he described his pain as being in the area of the left buttocks running down through the back of the leg down to the level of the left knee.  In addition to the pain, he also was experiencing some pain that was not connected to the other  one,  just below his knee cap.  Initially the information taken by the nursing staff indicated that he had received absolutely no relief of the pain.  However, in talking to him he indicated that the area was numb for.  6 to 8 hours and during that time, he was not experiencing any of this pain.  He then referred to the nursing staff that he had attained no long-term benefit from it however, as I spoke to him today, in an effort to try to figure out where this pain was coming from, I asked him to perform a couple of maneuvers for me.  I first asked him to do a hyperextension maneuver of his lumbar spine and he indicated that it did not trigger or reproduce his pain.  He then went on to tell me that the pain in that area rather than being constant like it was before the left L3-4 epidural, it is now intermittent.  He refers that he gets that pain when he sits for prolonged periods of time including with long car rides.  Thinking that it may have to do with the hip joints, SI joint, or muscle, I asked the patient to perform several other maneuvers.  I first asked him to bring his knee to the chest and to tell me if that trigger the pain.  He indicated that it did not.  He again reminded me that right now he was not having any pain.  I then asked him to perform a Patrick maneuver and he indicated that this did not trigger any pain in the lower back, buttocks, groin area, or the lateral aspect of the lower extremity over the area of the hip.  When I asked him to perform the maneuver on the other side, he indicated that he did not get any type of low back pain, groin pain, buttocks pain, but he did experience a little bit of pain in the lateral aspect of the leg and he was quick to indicate that that was secondary to his hip problems.  I asked him if that pain was the same that he was describing in the buttocks and going down the back of the leg and he indicated that this was completely different.  He refers that this  pain, at this point it seems to be very elusive.  I also had the patient perform a hyperextension on rotation maneuver but again he indicated that he was able to do good to both sides without triggering or reproducing his pain.  In reviewing when we did the procedure, it seems that he was only 8 days ago and usually the steroids may take anywhere from 4 to 10 days to work as they are Depo steroids and they may remain in the system for as long as 2 weeks.  There is the possibility that he is simply still getting benefit from that epidural and that we have not really seen the final result.  This is because the patient was brought in for a postprocedure evaluation before the 2-week mark.  We talked about this and he indicated that he is schedule to go on a trip to the [place] tomorrow and if by any chance his pain returns, he has been instructed to give [place] a call so that we can bring him in for a face-to-face evaluation.  He understood and agreed.  Post-Procedure Evaluation  Procedure (05/05/2020): Therapeutic left L3-4 LESI #4 under fluoroscopic guidance, no sedation Pre-procedure pain level: 7/10 Post-procedure: 0/10 (100% relief)  Sedation: None.  Effectiveness during initial hour after procedure(Ultra-Short Term Relief): 100%.  Local anesthetic used: Long-acting (4-6 hours) Effectiveness: Defined as any analgesic benefit obtained secondary to the administration of local anesthetics. This carries significant diagnostic value as to the etiological location, or anatomical origin, of the pain. Duration of benefit is expected to coincide with the duration of the local anesthetic used.  Effectiveness during initial 4-6 hours after procedure(Short-Term Relief): 100%.  Long-term benefit: Defined as any relief past the pharmacologic duration of the local anesthetics.  Effectiveness past the initial 6 hours after procedure(Long-Term Relief): 0%.  Current benefits: Defined as benefit that persist at this  time.   Analgesia:  >50% relief Function: Matthew Maldonado reports improvement in function ROM: Matthew Maldonado reports improvement in ROM  Pharmacotherapy Assessment  Analgesic: Oxycodone IR 5 mg, 1-2 tabs PO HS (10 mg/day of oxycodone) MME/day: 15 mg/day.   Monitoring: [place] PMP: PDMP reviewed during this encounter.       Pharmacotherapy: No side-effects or adverse reactions reported. Compliance: No problems identified. Effectiveness: Clinically acceptable. Plan: Refer to "POC".  UDS:  Summary  Date Value Ref Range Status  08/01/2019 Note  Final    Comment:    ==================================================================== [facility] Select 13 (MW) ==================================================================== Test                             Result       Flag       Units Drug Present not Declared for Prescription Verification   Amphetamine                    585          UNEXPECTED ng/mg creat    Amphetamine is available as a schedule II prescription drug. Drug Absent but Declared for Prescription Verification   Oxycodone                      Not Detected UNEXPECTED ng/mg creat ==================================================================== Test                      Result    Flag   Units      Ref Range   Creatinine              84               mg/dL      >=20 ==================================================================== Declared Medications:  The flagging and interpretation on this report are based on the  following declared medications.  Unexpected results may arise from  inaccuracies in the declared medications.  **Note: The testing scope of this panel includes these medications:  Oxycodone  **Note: The testing scope of this panel does not include the  following reported medications:  Atorvastatin  Buspirone  Cyclobenzaprine  Esomeprazole (Nexium)  Fluticasone  Metoprolol  Multivitamin  Pregabalin  Topical  Topical Diclofenac   Venlafaxine ==================================================================== For clinical consultation, please call [phone]. ====================================================================     Laboratory Chemistry Profile   Renal Lab Results  Component Value Date   BUN 26 (H) 10/14/2019   CREATININE 0.90 01/13/2020   BCR 28 (H) 10/14/2019   GFRAA 96 10/14/2019   GFRNONAA 83 10/14/2019     Hepatic Lab Results  Component Value Date   AST 23 10/14/2019   ALT 25 10/14/2019   ALBUMIN 4.6 12/26/2018   ALKPHOS 82 12/26/2018     Electrolytes Lab Results  Component Value Date   NA 141 10/14/2019   K 4.7 10/14/2019   CL 106 10/14/2019   CALCIUM 9.4 10/14/2019   MG 2.1 12/26/2018     Bone Lab Results  Component Value Date   25OHVITD1 43 12/26/2018   25OHVITD2 <1.0 12/26/2018   25OHVITD3 43 12/26/2018     Inflammation (CRP: Acute Phase) (ESR: Chronic Phase) Lab Results  Component Value Date   CRP 1 12/26/2018   ESRSEDRATE 16 12/26/2018       Note: Above Lab results reviewed.  Imaging  DG PAIN CLINIC C-ARM 1-60 MIN NO REPORT Fluoro was used, but no Radiologist interpretation will be provided.  Please refer to "NOTES" tab for provider progress note.  Assessment  The primary encounter diagnosis was Chronic pain syndrome. Diagnoses of Chronic pain of lower extremity (1ry area of Pain) (Bilateral) (L>R), Chronic buttock pain (2ry area of Pain) (Bilateral) (L>R), Chronic hip pain (3ry area of Pain) (Bilateral) (L>R), Chronic knee pain (5th area of Pain) (Left), Anterolisthesis of lumbar spine (< 63m) (L3/L4, L4/L5), Ligamentum flavum hypertrophy (Multilevel), Lumbar lateral recess stenosis (Bilateral: L2-3, L4-5) (Left: L3-4), and Arthropathy of spinal facet joint concurrent with and due to effusion (L3-4, L4-5, L5-S1) were also pertinent to this visit.  Plan of Care  Problem-specific:  No problem-specific Assessment & Plan notes found for this  encounter.  Mr. KJOHNWESLEY LEDERMANhas a current medication list which includes the following long-term medication(s): atorvastatin, diclofenac sodium, diclofenac sodium, esomeprazole, fluticasone, metoprolol succinate, oxycodone, pregabalin, and venlafaxine xr.  Pharmacotherapy (Medications Ordered): No orders of the defined types were placed in this encounter.  Orders:  No orders of the defined types were placed in this encounter.  Follow-up plan:   Return if symptoms worsen or fail to improve.      Interventional Therapies  Risk  Complexity Considerations:   WNL   Planned  Pending:   Therapeutic left L3-4 LESI #4    Under consideration:   Palliative treatments   Completed:   Therapeutic bilateral IA hip joint injection x2 (01/23/2020) Therapeutic bilateral gluteofemoral bursa injection x2 (01/23/2020) Therapeutic/palliative left L3-4 LESI x3 (07/09/2019)   Therapeutic  Palliative (PRN) options:   Therapeutic bilateral IA hip joint injection #2  Therapeutic bilateral gluteal femoral bursa injection #2  Therapeutic/palliative left L3-4 LESI #4       Recent Visits Date Type Provider Dept  05/05/20 Procedure visit NMilinda Pointer MD [facility]  Showing recent visits within past 90 days and meeting all other requirements Today's Visits Date Type Provider Dept  05/13/20 Telemedicine NMilinda Pointer MD [facility]  Showing today's visits and meeting all other requirements Future Appointments No visits were found meeting these conditions. Showing future appointments within next 90 days and meeting all other requirements  I discussed the assessment and treatment plan with the patient. The patient was provided an opportunity to ask questions and all were answered. The patient agreed with the plan and demonstrated an understanding of the instructions.  Patient advised to call back or seek an in-person evaluation if the symptoms or condition  worsens.  Duration of encounter: 20 minutes.  Note by: FGaspar Cola MD Date: 05/13/2020; Time: 12:26 PM

## 2020-05-13 ENCOUNTER — Other Ambulatory Visit: Payer: Self-pay

## 2020-05-13 ENCOUNTER — Ambulatory Visit: Payer: Medicare Other | Attending: Pain Medicine | Admitting: Pain Medicine

## 2020-05-13 DIAGNOSIS — M48061 Spinal stenosis, lumbar region without neurogenic claudication: Secondary | ICD-10-CM

## 2020-05-13 DIAGNOSIS — M79605 Pain in left leg: Secondary | ICD-10-CM | POA: Insufficient documentation

## 2020-05-13 DIAGNOSIS — M2428 Disorder of ligament, vertebrae: Secondary | ICD-10-CM

## 2020-05-13 DIAGNOSIS — M25552 Pain in left hip: Secondary | ICD-10-CM | POA: Diagnosis not present

## 2020-05-13 DIAGNOSIS — G8929 Other chronic pain: Secondary | ICD-10-CM | POA: Diagnosis not present

## 2020-05-13 DIAGNOSIS — M2540 Effusion, unspecified joint: Secondary | ICD-10-CM | POA: Diagnosis not present

## 2020-05-13 DIAGNOSIS — M25562 Pain in left knee: Secondary | ICD-10-CM | POA: Insufficient documentation

## 2020-05-13 DIAGNOSIS — M47819 Spondylosis without myelopathy or radiculopathy, site unspecified: Secondary | ICD-10-CM

## 2020-05-13 DIAGNOSIS — M4316 Spondylolisthesis, lumbar region: Secondary | ICD-10-CM | POA: Diagnosis not present

## 2020-05-13 DIAGNOSIS — M79604 Pain in right leg: Secondary | ICD-10-CM | POA: Diagnosis not present

## 2020-05-13 DIAGNOSIS — M25551 Pain in right hip: Secondary | ICD-10-CM | POA: Insufficient documentation

## 2020-05-13 DIAGNOSIS — G894 Chronic pain syndrome: Secondary | ICD-10-CM

## 2020-05-13 DIAGNOSIS — M7918 Myalgia, other site: Secondary | ICD-10-CM | POA: Diagnosis not present

## 2020-05-26 DIAGNOSIS — S46811A Strain of other muscles, fascia and tendons at shoulder and upper arm level, right arm, initial encounter: Secondary | ICD-10-CM | POA: Diagnosis not present

## 2020-05-26 DIAGNOSIS — M25511 Pain in right shoulder: Secondary | ICD-10-CM | POA: Diagnosis not present

## 2020-05-26 DIAGNOSIS — M75121 Complete rotator cuff tear or rupture of right shoulder, not specified as traumatic: Secondary | ICD-10-CM | POA: Diagnosis not present

## 2020-05-26 DIAGNOSIS — S4991XA Unspecified injury of right shoulder and upper arm, initial encounter: Secondary | ICD-10-CM | POA: Diagnosis not present

## 2020-06-08 ENCOUNTER — Other Ambulatory Visit: Payer: Self-pay | Admitting: Family Medicine

## 2020-06-08 DIAGNOSIS — F338 Other recurrent depressive disorders: Secondary | ICD-10-CM

## 2020-06-08 DIAGNOSIS — F411 Generalized anxiety disorder: Secondary | ICD-10-CM

## 2020-06-08 NOTE — Telephone Encounter (Signed)
Attempted to call patient to schedule 6 month follow up- left message to call office. Courtesy RF #[phone] given

## 2020-06-10 DIAGNOSIS — D485 Neoplasm of uncertain behavior of skin: Secondary | ICD-10-CM | POA: Diagnosis not present

## 2020-06-10 DIAGNOSIS — H61002 Unspecified perichondritis of left external ear: Secondary | ICD-10-CM | POA: Diagnosis not present

## 2020-07-10 NOTE — Progress Notes (Signed)
2nd attempt to reach patient prior to VV.  Voicemail left.

## 2020-07-12 NOTE — Progress Notes (Signed)
Patient: Matthew Maldonado  Service Category: E/M  Provider: Gaspar Cola, MD  DOB: 20-Apr-1950  DOS: 07/13/2020  Location: Office  MRN: [id number]  Setting: Ambulatory outpatient  Referring Provider: Nobie Putnam *  Type: Established Patient  Specialty: Interventional Pain Management  PCP: Olin Hauser, DO  Location: Remote location  Delivery: TeleHealth     Virtual Encounter - Pain Management PROVIDER NOTE: Information contained herein reflects review and annotations entered in association with encounter. Interpretation of such information and data should be left to medically-trained personnel. Information provided to patient can be located elsewhere in the medical record under "Patient Instructions". Document created using STT-dictation technology, any transcriptional errors that may result from process are unintentional.    Contact & Pharmacy Preferred: [phone] Home: [phone] (home) Mobile: [phone] (mobile) E-mail: [email or web address]  [place], [place] [address] [place] Phone: [phone] Fax: [phone]   Pre-screening  Mr. Eoff offered "in-person" vs "virtual" encounter. He indicated preferring virtual for this encounter.   Reason COVID-19*  Social distancing based on CDC and AMA recommendations.   I contacted Margarita Grizzle on 07/13/2020 via telephone.      I clearly identified myself as 18/04/2020, MD. I verified that I was speaking with the correct person using two identifiers (Name: REYES ALDACO, and date of birth: Jul 13, 1950).  Consent I sought verbal advanced consent from 12/20/1950 for virtual visit interactions. I informed Mr. Tomasello of possible security and privacy concerns, risks, and limitations associated with providing "not-in-person" medical evaluation and management services. I also informed Mr. Bilodeau of the availability of "in-person" appointments. Finally, I informed him that  there would be a charge for the virtual visit and that he could be  personally, fully or partially, financially responsible for it. Mr. Imbert expressed understanding and agreed to proceed.   Historic Elements   Mr. JMICHAEL GILLE is a 70 y.o. year old, male patient evaluated today after our last contact on 05/11/2020. Mr. Maffeo  has a past medical history of Anxiety, Arthritis, Blood vessel disorder, BPH (benign prostatic hyperplasia), GERD (gastroesophageal reflux disease), Insomnia, Minimal change disease, Nocturia, PSVT (paroxysmal supraventricular tachycardia) ([place]), and TIA (transient ischemic attack). He also  has a past surgical history that includes Renal biopsy; Nasal septoplasty w/ turbinoplasty (Bilateral, 03/22/2018); Nasopharyngoscopy eustation tube balloon dilation (Bilateral, 03/22/2018); Nasal endoscopy (Bilateral, 03/22/2018); Excision nasal mass (Left, 03/22/2018); and Colonoscopy with propofol (N/A, 04/04/2019). Mr. Mcfarlane has a current medication list which includes the following prescription(s): atorvastatin, buspirone, diclofenac sodium, diclofenac sodium, esomeprazole, fluticasone, ketoconazole, metoprolol succinate, multivitamin with minerals, oxycodone, pregabalin, and venlafaxine xr. He  reports that he has quit smoking. His smoking use included cigars. He quit after 4.00 years of use. He has quit using smokeless tobacco. He reports current alcohol use of about 18.0 standard drinks of alcohol per week. He reports that he does not use drugs. Mr. Inglett has No Known Allergies.   HPI  Today, he is being contacted for worsening of previously known (established) problem.  She refers that he has been having more problems with both hips, but it is his sciatica has flared up, bilaterally.  He refers that the left side is worse and goes all the way down to the front of the kneecap rotating from the posterior aspect of the leg into the anterior and what appears to be an L3 dermatomal distribution, bilaterally,  but worse pain in the left.  Today I have reviewed his MRI which indicates that he has an anterolisthesis at the L3-4 and L4-5 levels that are likely to be responsible for his symptoms.  He has Lumbar lateral recess stenosis bilaterally at the L2-3, L4-5 and on the left side at the L3-4 level.  He refers not having any back pain at this time.  Pharmacotherapy Assessment  Analgesic: Oxycodone IR 5 mg, 1-2 tabs PO HS (10 mg/day of oxycodone) MME/day: 15 mg/day.   Monitoring: [place] PMP: PDMP reviewed during this encounter.       Pharmacotherapy: No side-effects or adverse reactions reported. Compliance: No problems identified. Effectiveness: Clinically acceptable. Plan: Refer to "POC".  UDS:  Summary  Date Value Ref Range Status  08/01/2019 Note  Final    Comment:    ==================================================================== [facility] Select 13 (MW) ==================================================================== Test                             Result       Flag       Units Drug Present not Declared for Prescription Verification   Amphetamine                    585          UNEXPECTED ng/mg creat    Amphetamine is available as a schedule II prescription drug. Drug Absent but Declared for Prescription Verification   Oxycodone                      Not Detected UNEXPECTED ng/mg creat ==================================================================== Test                      Result    Flag   Units      Ref Range   Creatinine              84               mg/dL      >=20 ==================================================================== Declared Medications:  The flagging and interpretation on this report are based on the  following declared medications.  Unexpected results may arise from  inaccuracies in the declared medications.  **Note: The testing scope of this panel includes these medications:  Oxycodone  **Note: The testing scope of this panel does not include  the  following reported medications:  Atorvastatin  Buspirone  Cyclobenzaprine  Esomeprazole (Nexium)  Fluticasone  Metoprolol  Multivitamin  Pregabalin  Topical  Topical Diclofenac  Venlafaxine ==================================================================== For clinical consultation, please call [phone]. ====================================================================     Laboratory Chemistry Profile   Renal Lab Results  Component Value Date   BUN 26 (H) 10/14/2019   CREATININE 0.90 01/13/2020   BCR 28 (H) 10/14/2019   GFRAA 96 10/14/2019   GFRNONAA 83 10/14/2019     Hepatic Lab Results  Component Value Date   AST 23 10/14/2019   ALT 25 10/14/2019   ALBUMIN 4.6 12/26/2018   ALKPHOS 82 12/26/2018     Electrolytes Lab Results  Component Value Date   NA 141 10/14/2019   K 4.7 10/14/2019   CL 106 10/14/2019   CALCIUM 9.4 10/14/2019   MG 2.1 12/26/2018     Bone Lab Results  Component Value Date   25OHVITD1 43 12/26/2018   25OHVITD2 <1.0 12/26/2018   25OHVITD3 43 12/26/2018     Inflammation (CRP: Acute Phase) (ESR: Chronic Phase) Lab Results  Component Value Date  CRP 1 12/26/2018   ESRSEDRATE 16 12/26/2018       Note: Above Lab results reviewed.  Imaging  DG PAIN CLINIC C-ARM 1-60 MIN NO REPORT Fluoro was used, but no Radiologist interpretation will be provided.  Please refer to "NOTES" tab for provider progress note.  Assessment  The primary encounter diagnosis was DDD (degenerative disc disease), lumbosacral. Diagnoses of Chronic lower extremity pain (1ry area of Pain) (Bilateral) (L>R), Lumbar lateral recess stenosis (Bilateral: L2-3, L4-5) (Left: L3-4), Anterolisthesis of lumbar spine (< 20m) (L3/L4, L4/L5), Arthropathy of spinal facet joint concurrent with and due to effusion (L3-4, L4-5, L5-S1), Chronic lumbar radiculopathy (Left), Chronic hip pain (3ry area of Pain) (Bilateral) (L>R), and Chronic thigh pain (4th area of Pain)  (Left) were also pertinent to this visit.  Plan of Care  Problem-specific:  No problem-specific Assessment & Plan notes found for this encounter.  Mr. KYOUSUF AGERhas a current medication list which includes the following long-term medication(s): atorvastatin, diclofenac sodium, diclofenac sodium, esomeprazole, fluticasone, metoprolol succinate, oxycodone, pregabalin, and venlafaxine xr.  Pharmacotherapy (Medications Ordered): No orders of the defined types were placed in this encounter.  Orders:  Orders Placed This Encounter  Procedures  . Lumbar Epidural Injection    Standing Status:   Future    Standing Expiration Date:   08/13/2020    Scheduling Instructions:     Procedure: Interlaminar Lumbar Epidural Steroid injection (LESI)  L2-3     Laterality: Left-sided     Sedation: Patient's choice.     Timeframe: ASAA    Order Specific Question:   Where will this procedure be performed?    Answer:   [facility]  . Lumbar Transforaminal Epidural    Standing Status:   Future    Standing Expiration Date:   08/13/2020    Scheduling Instructions:     Side: Bilateral     Level: L3     Sedation: Patient's choice.     Timeframe: ASAP    Order Specific Question:   Where will this procedure be performed?    Answer:   [facility]   Follow-up plan:   Return for Procedure (w/ sedation): (L) L2-3 LESI #1 + (B) L3 TFESI #1.      Interventional Therapies  Risk  Complexity Considerations:   WNL   Planned  Pending:   Diagnostic/therapeutic left L2-3 LESI #1  Diagnostic/therapeutic bilateral L3 TFESI #1    Under consideration:   Palliative treatments   Completed:   Therapeutic bilateral IA hip joint injection x2 (01/23/2020) Therapeutic bilateral gluteofemoral bursa injection x2 (01/23/2020) Therapeutic/palliative left L3-4 LESI x3 (07/09/2019)   Therapeutic  Palliative (PRN) options:   Therapeutic bilateral IA hip joint injection #2  Therapeutic bilateral gluteal  femoral bursa injection #2  Therapeutic/palliative left L3-4 LESI #4        Recent Visits Date Type Provider Dept  05/13/20 Telemedicine NMilinda Pointer MD [facility]  05/05/20 Procedure visit NMilinda Pointer MD [facility]  Showing recent visits within past 90 days and meeting all other requirements Today's Visits Date Type Provider Dept  07/13/20 Telemedicine NMilinda Pointer MD [facility]  Showing today's visits and meeting all other requirements Future Appointments No visits were found meeting these conditions. Showing future appointments within next 90 days and meeting all other requirements  I discussed the assessment and treatment plan with the patient. The patient was provided an opportunity to ask questions and all were answered. The patient agreed with  the plan and demonstrated an understanding of the instructions.  Patient advised to call back or seek an in-person evaluation if the symptoms or condition worsens.  Duration of encounter: 18 minutes.  Note by: Gaspar Cola, MD Date: 07/13/2020; Time: 4:48 PM

## 2020-07-13 ENCOUNTER — Telehealth: Payer: Self-pay | Admitting: *Deleted

## 2020-07-13 ENCOUNTER — Ambulatory Visit: Payer: Medicare Other | Attending: Pain Medicine | Admitting: Pain Medicine

## 2020-07-13 ENCOUNTER — Other Ambulatory Visit: Payer: Self-pay

## 2020-07-13 DIAGNOSIS — G8929 Other chronic pain: Secondary | ICD-10-CM

## 2020-07-13 DIAGNOSIS — M76891 Other specified enthesopathies of right lower limb, excluding foot: Secondary | ICD-10-CM

## 2020-07-13 DIAGNOSIS — M5416 Radiculopathy, lumbar region: Secondary | ICD-10-CM

## 2020-07-13 DIAGNOSIS — M79605 Pain in left leg: Secondary | ICD-10-CM

## 2020-07-13 DIAGNOSIS — M5137 Other intervertebral disc degeneration, lumbosacral region: Secondary | ICD-10-CM | POA: Diagnosis not present

## 2020-07-13 DIAGNOSIS — M2540 Effusion, unspecified joint: Secondary | ICD-10-CM

## 2020-07-13 DIAGNOSIS — M48061 Spinal stenosis, lumbar region without neurogenic claudication: Secondary | ICD-10-CM

## 2020-07-13 DIAGNOSIS — M47819 Spondylosis without myelopathy or radiculopathy, site unspecified: Secondary | ICD-10-CM | POA: Diagnosis not present

## 2020-07-13 DIAGNOSIS — M25551 Pain in right hip: Secondary | ICD-10-CM | POA: Diagnosis not present

## 2020-07-13 DIAGNOSIS — M79604 Pain in right leg: Secondary | ICD-10-CM | POA: Diagnosis not present

## 2020-07-13 DIAGNOSIS — M25552 Pain in left hip: Secondary | ICD-10-CM | POA: Diagnosis not present

## 2020-07-13 DIAGNOSIS — M7061 Trochanteric bursitis, right hip: Secondary | ICD-10-CM

## 2020-07-13 DIAGNOSIS — M79652 Pain in left thigh: Secondary | ICD-10-CM | POA: Diagnosis not present

## 2020-07-13 DIAGNOSIS — M25852 Other specified joint disorders, left hip: Secondary | ICD-10-CM

## 2020-07-13 DIAGNOSIS — M160 Bilateral primary osteoarthritis of hip: Secondary | ICD-10-CM

## 2020-07-13 DIAGNOSIS — M1612 Unilateral primary osteoarthritis, left hip: Secondary | ICD-10-CM

## 2020-07-13 DIAGNOSIS — M4316 Spondylolisthesis, lumbar region: Secondary | ICD-10-CM

## 2020-07-13 NOTE — Patient Instructions (Signed)
____________________________________________________________________________________________  Preparing for Procedure with Sedation  Procedure appointments are limited to planned procedures: . No Prescription Refills. . No disability issues will be discussed. . No medication changes will be discussed.  Instructions: . Oral Intake: Do not eat or drink anything for at least 8 hours prior to your procedure. (Exception: Blood Pressure Medication. See below.) . Transportation: Unless otherwise stated by your physician, you may drive yourself after the procedure. . Blood Pressure Medicine: Do not forget to take your blood pressure medicine with a sip of water the morning of the procedure. If your Diastolic (lower reading)is above 100 mmHg, elective cases will be cancelled/rescheduled. . Blood thinners: These will need to be stopped for procedures. Notify our staff if you are taking any blood thinners. Depending on which one you take, there will be specific instructions on how and when to stop it. . Diabetics on insulin: Notify the staff so that you can be scheduled 1st case in the morning. If your diabetes requires high dose insulin, take only  of your normal insulin dose the morning of the procedure and notify the staff that you have done so. . Preventing infections: Shower with an antibacterial soap the morning of your procedure. . Build-up your immune system: Take 1000 mg of Vitamin C with every meal (3 times a day) the day prior to your procedure. . Antibiotics: Inform the staff if you have a condition or reason that requires you to take antibiotics before dental procedures. . Pregnancy: If you are pregnant, call and cancel the procedure. . Sickness: If you have a cold, fever, or any active infections, call and cancel the procedure. . Arrival: You must be in the facility at least 30 minutes prior to your scheduled procedure. . Children: Do not bring children with you. . Dress appropriately:  Bring dark clothing that you would not mind if they get stained. . Valuables: Do not bring any jewelry or valuables.  Reasons to call and reschedule or cancel your procedure: (Following these recommendations will minimize the risk of a serious complication.) . Surgeries: Avoid having procedures within 2 weeks of any surgery. (Avoid for 2 weeks before or after any surgery). . Flu Shots: Avoid having procedures within 2 weeks of a flu shots or . (Avoid for 2 weeks before or after immunizations). . Barium: Avoid having a procedure within 7-10 days after having had a radiological study involving the use of radiological contrast. (Myelograms, Barium swallow or enema study). . Heart attacks: Avoid any elective procedures or surgeries for the initial 6 months after a "Myocardial Infarction" (Heart Attack). . Blood thinners: It is imperative that you stop these medications before procedures. Let us know if you if you take any blood thinner.  . Infection: Avoid procedures during or within two weeks of an infection (including chest colds or gastrointestinal problems). Symptoms associated with infections include: Localized redness, fever, chills, night sweats or profuse sweating, burning sensation when voiding, cough, congestion, stuffiness, runny nose, sore throat, diarrhea, nausea, vomiting, cold or Flu symptoms, recent or current infections. It is specially important if the infection is over the area that we intend to treat. . Heart and lung problems: Symptoms that may suggest an active cardiopulmonary problem include: cough, chest pain, breathing difficulties or shortness of breath, dizziness, ankle swelling, uncontrolled high or unusually low blood pressure, and/or palpitations. If you are experiencing any of these symptoms, cancel your procedure and contact your primary care physician for an evaluation.  Remember:  Regular Business hours are:    Monday to Thursday 8:00 AM to 4:00 PM  Provider's  Schedule: Rahm Minix, MD:  Procedure days: Tuesday and Thursday 7:30 AM to 4:00 PM  Bilal Lateef, MD:  Procedure days: Monday and Wednesday 7:30 AM to 4:00 PM ____________________________________________________________________________________________    

## 2020-07-13 NOTE — Telephone Encounter (Signed)
Attempted to call for pre appointment review of allergies/meds. Message left. 

## 2020-07-22 NOTE — Progress Notes (Deleted)
No show to appointment.

## 2020-07-23 ENCOUNTER — Ambulatory Visit: Payer: Medicare Other | Admitting: Pain Medicine

## 2020-07-28 ENCOUNTER — Other Ambulatory Visit: Payer: Self-pay

## 2020-07-28 ENCOUNTER — Encounter: Payer: Self-pay | Admitting: Pain Medicine

## 2020-07-28 ENCOUNTER — Ambulatory Visit (HOSPITAL_BASED_OUTPATIENT_CLINIC_OR_DEPARTMENT_OTHER): Payer: Medicare Other | Admitting: Pain Medicine

## 2020-07-28 ENCOUNTER — Ambulatory Visit
Admission: RE | Admit: 2020-07-28 | Discharge: 2020-07-28 | Disposition: A | Discharge status: Home / Self Care | Payer: Medicare Other | Source: Ambulatory Visit | Attending: Pain Medicine | Admitting: Pain Medicine

## 2020-07-28 VITALS — BP 143/94 | HR 66 | Temp 97.5°F | Resp 17 | Ht 68.0 in | Wt 195.0 lb

## 2020-07-28 DIAGNOSIS — M79605 Pain in left leg: Secondary | ICD-10-CM | POA: Diagnosis not present

## 2020-07-28 DIAGNOSIS — M4316 Spondylolisthesis, lumbar region: Secondary | ICD-10-CM | POA: Diagnosis not present

## 2020-07-28 DIAGNOSIS — M5137 Other intervertebral disc degeneration, lumbosacral region: Secondary | ICD-10-CM | POA: Diagnosis not present

## 2020-07-28 DIAGNOSIS — M5442 Lumbago with sciatica, left side: Secondary | ICD-10-CM | POA: Insufficient documentation

## 2020-07-28 DIAGNOSIS — M5441 Lumbago with sciatica, right side: Secondary | ICD-10-CM | POA: Diagnosis not present

## 2020-07-28 DIAGNOSIS — M7918 Myalgia, other site: Secondary | ICD-10-CM | POA: Insufficient documentation

## 2020-07-28 DIAGNOSIS — G8929 Other chronic pain: Secondary | ICD-10-CM | POA: Diagnosis not present

## 2020-07-28 DIAGNOSIS — M79604 Pain in right leg: Secondary | ICD-10-CM | POA: Diagnosis not present

## 2020-07-28 DIAGNOSIS — M48061 Spinal stenosis, lumbar region without neurogenic claudication: Secondary | ICD-10-CM | POA: Insufficient documentation

## 2020-07-28 MED ORDER — LIDOCAINE HCL 2 % IJ SOLN
20.0000 mL | Freq: Once | INTRAMUSCULAR | Status: AC
  Administered 2020-07-28: 400 mg

## 2020-07-28 MED ORDER — LIDOCAINE HCL 2 % IJ SOLN
INTRAMUSCULAR | Status: AC
  Filled 2020-07-28: qty 20

## 2020-07-28 MED ORDER — ROPIVACAINE HCL 2 MG/ML IJ SOLN
2.0000 mL | Freq: Once | INTRAMUSCULAR | Status: AC
  Administered 2020-07-28: 2 mL via EPIDURAL

## 2020-07-28 MED ORDER — ROPIVACAINE HCL 2 MG/ML IJ SOLN
INTRAMUSCULAR | Status: AC
  Filled 2020-07-28: qty 10

## 2020-07-28 MED ORDER — SODIUM CHLORIDE 0.9% FLUSH
2.0000 mL | Freq: Once | INTRAVENOUS | Status: AC
  Administered 2020-07-28: 2 mL

## 2020-07-28 MED ORDER — SODIUM CHLORIDE (PF) 0.9 % IJ SOLN
INTRAMUSCULAR | Status: AC
  Filled 2020-07-28: qty 10

## 2020-07-28 MED ORDER — IOHEXOL 180 MG/ML  SOLN
10.0000 mL | Freq: Once | INTRAMUSCULAR | Status: AC
  Administered 2020-07-28: 10 mL via EPIDURAL

## 2020-07-28 MED ORDER — DEXAMETHASONE SODIUM PHOSPHATE 10 MG/ML IJ SOLN
20.0000 mg | Freq: Once | INTRAMUSCULAR | Status: AC
  Administered 2020-07-28: 20 mg

## 2020-07-28 MED ORDER — TRIAMCINOLONE ACETONIDE 40 MG/ML IJ SUSP
INTRAMUSCULAR | Status: AC
  Filled 2020-07-28: qty 1

## 2020-07-28 MED ORDER — IOHEXOL 180 MG/ML  SOLN
INTRAMUSCULAR | Status: AC
  Filled 2020-07-28: qty 20

## 2020-07-28 MED ORDER — DEXAMETHASONE SODIUM PHOSPHATE 10 MG/ML IJ SOLN
INTRAMUSCULAR | Status: AC
  Filled 2020-07-28: qty 2

## 2020-07-28 NOTE — Patient Instructions (Signed)
____________________________________________________________________________________________  Post-Procedure Discharge Instructions  Instructions: Apply ice:  Purpose: This will minimize any swelling and discomfort after procedure.  When: Day of procedure, as soon as you get home. How: Fill a plastic sandwich bag with crushed ice. Cover it with a small towel and apply to injection site. How long: (15 min on, 15 min off) Apply for 15 minutes then remove x 15 minutes.  Repeat sequence on day of procedure, until you go to bed. Apply heat:  Purpose: To treat any soreness and discomfort from the procedure. When: Starting the next day after the procedure. How: Apply heat to procedure site starting the day following the procedure. How long: May continue to repeat daily, until discomfort goes away. Food intake: Start with clear liquids (like water) and advance to regular food, as tolerated.  Physical activities: Keep activities to a minimum for the first 8 hours after the procedure. After that, then as tolerated. Driving: If you have received any sedation, be responsible and do not drive. You are not allowed to drive for 24 hours after having sedation. Blood thinner: (Applies only to those taking blood thinners) You may restart your blood thinner 6 hours after your procedure. Insulin: (Applies only to Diabetic patients taking insulin) As soon as you can eat, you may resume your normal dosing schedule. Infection prevention: Keep procedure site clean and dry. Shower daily and clean area with soap and water. Post-procedure Pain Diary: Extremely important that this be done correctly and accurately. Recorded information will be used to determine the next step in treatment. For the purpose of accuracy, follow these rules: Evaluate only the area treated. Do not report or include pain from an untreated area. For the purpose of this evaluation, ignore all other areas of pain, except for the treated area. After  your procedure, avoid taking a long nap and attempting to complete the pain diary after you wake up. Instead, set your alarm clock to go off every hour, on the hour, for the initial 8 hours after the procedure. Document the duration of the numbing medicine, and the relief you are getting from it. Do not go to sleep and attempt to complete it later. It will not be accurate. If you received sedation, it is likely that you were given a medication that may cause amnesia. Because of this, completing the diary at a later time may cause the information to be inaccurate. This information is needed to plan your care. Follow-up appointment: Keep your post-procedure follow-up evaluation appointment after the procedure (usually 2 weeks for most procedures, 6 weeks for radiofrequencies). DO NOT FORGET to bring you pain diary with you.   Expect: (What should I expect to see with my procedure?) From numbing medicine (AKA: Local Anesthetics): Numbness or decrease in pain. You may also experience some weakness, which if present, could last for the duration of the local anesthetic. Onset: Full effect within 15 minutes of injected. Duration: It will depend on the type of local anesthetic used. On the average, 1 to 8 hours.  From steroids (Applies only if steroids were used): Decrease in swelling or inflammation. Once inflammation is improved, relief of the pain will follow. Onset of benefits: Depends on the amount of swelling present. The more swelling, the longer it will take for the benefits to be seen. In some cases, up to 10 days. Duration: Steroids will stay in the system x 2 weeks. Duration of benefits will depend on multiple posibilities including persistent irritating factors. Side-effects: If present, they   may typically last 2 weeks (the duration of the steroids). Frequent: Cramps (if they occur, drink Gatorade and take over-the-counter Magnesium 450-500 mg once to twice a day); water retention with temporary  weight gain; increases in blood sugar; decreased immune system response; increased appetite. Occasional: Facial flushing (red, warm cheeks); mood swings; menstrual changes. Uncommon: Long-term decrease or suppression of natural hormones; bone thinning. (These are more common with higher doses or more frequent use. This is why we prefer that our patients avoid having any injection therapies in other practices.)  Very Rare: Severe mood changes; psychosis; aseptic necrosis. From procedure: Some discomfort is to be expected once the numbing medicine wears off. This should be minimal if ice and heat are applied as instructed.  Call if: (When should I call?) You experience numbness and weakness that gets worse with time, as opposed to wearing off. New onset bowel or bladder incontinence. (Applies only to procedures done in the spine)  Emergency Numbers: Durning business hours (Monday - Thursday, 8:00 AM - 4:00 PM) (Friday, 9:00 AM - 12:00 Noon): [phone] After hours: [phone] NOTE: If you are having a problem and are unable connect with, or to talk to a provider, then go to your nearest urgent care or emergency department. If the problem is serious and urgent, please call 911. ____________________________________________________________________________________________ ____________________________________________________________________________________________  Virtual Visits   What is a "Virtual Visit"? It is a healthcare communication encounter (medical visit) that takes place on real time (NOT TEXT or E-MAIL) over the telephone or computer device (desktop, laptop, tablet, smart phone, etc.). It allows for more location flexibility between the patient and the healthcare provider.  Who decides when these types of visits will be used? The physician.  Who is eligible for these types of visits? Only those patients that can be reliably reached over the telephone.  What do you mean by  reliably? We do not have time to call everyone multiple times, therefore those that tend to screen calls and then call back later are not suitable candidates for this system. We understand how people are reluctant to pickup on "unknown" calls, therefore, we suggest adding our telephone numbers to your list of "CONTACT(s)". This way, you should be able to readily identify our calls when you receive one. All of our numbers are available below.   Who is not eligible? This option is not available for medication management encounters, specially for controlled substances. Patients on pain medications that fall under the category of controlled substances have to come in for "Face-to-Face" encounters. This is required for mandatory monitoring of these substances. You may be asked to provide a sample for an unannounced urine drug screening test (UDS), and we will need to count your pain pills. Not bringing your pills to be counted may result in no refill. Obviously, neither one of these can be done over the phone.  When will this type of visits be used? You can request a virtual visit whenever you are physically unable to attend a regular appointment. The decision will be made by the physician (or healthcare provider) on a case by case basis.   At what time will I be called? This is an excellent question. The providers will try to call you whenever they have time available. Do not expect to be called at any specific time. The secretaries will assign you a time for your virtual visit appointment, but this is done simply to keep a list of those patients that need to be called, but   not for the purpose of keeping a time schedule. Be advised that the call may come in anytime during the day, between the hours of 8:00 AM and 8::00 PM, depending on provider availability. We do understand that the system is not perfect. If you are unable to be available that day on a moments notice, then request an "in-person" appointment  rather than a "virtual visit".  Can I request my medication visits to be "Virtual"? Yes you may request it, but the decision is entirely up to the healthcare provider. Control substances require specific monitoring that requires Face-to-Face encounters. The number of encounters  and the extent of the monitoring is determined on a case by case basis.  Add a new contact to your smart phone and label it "PAIN CLINIC" Under this contact add the following numbers: Main: [phone] (Official Contact Number) Nurses: [phone] (These are outgoing only calling systems. Do not call this number.) Dr. Daniesha Driver: [phone] or [phone] (Outgoing calls only. Do not call this number.)  ____________________________________________________________________________________________    

## 2020-07-28 NOTE — Progress Notes (Signed)
Safety precautions to be maintained throughout the outpatient stay will include: orient to surroundings, keep bed in low position, maintain call bell within reach at all times, provide assistance with transfer out of bed and ambulation.  

## 2020-07-28 NOTE — Progress Notes (Signed)
PROVIDER NOTE: Information contained herein reflects review and annotations entered in association with encounter. Interpretation of such information and data should be left to medically-trained personnel. Information provided to patient can be located elsewhere in the medical record under "Patient Instructions". Document created using STT-dictation technology, any transcriptional errors that may result from process are unintentional.    Patient: Matthew Maldonado  Service Category: Procedure  Provider: Gaspar Cola, MD  DOB: [birth year]  DOS: 07/28/2020  Location: [facility]  MRN: [id number]  Setting: Ambulatory - outpatient  Referring Provider: Nobie Putnam *  Type: Established Patient  Specialty: Interventional Pain Management  PCP: Olin Hauser, DO   Primary Reason for Visit: Interventional Pain Management Treatment. CC: Back Pain (lower)  Procedure:          Anesthesia, Analgesia, Anxiolysis:  Type: Trans-Foraminal Epidural Steroid Injection #1  Purpose: Diagnostic/Therapeutic Region: Posterolateral Lumbosacral Target Area: The 6 o'clock position under the pedicle, on the affected side. Approach: Posterior Percutaneous Paravertebral approach. Level: L3 Level Laterality: Bilateral Paravertebral  Type: Local Anesthesia Indication(s): Analgesia         Route: Infiltration (Simpson/IM) IV Access: Declined Sedation: Declined  Local Anesthetic: Lidocaine 1-2%  Position: Prone   Indications: 1. Anterolisthesis of lumbar spine (< 26mm) (L3/L4, L4/L5)   2. DDD (degenerative disc disease), lumbosacral   3. Lumbar lateral recess stenosis (Bilateral: L2-3, L4-5) (Left: L3-4)   4. Chronic lower extremity pain (1ry area of Pain) (Bilateral) (L>R)   5. Chronic low back pain (Bilateral) w/ sciatica (Bilateral) (L>R)   6. Chronic buttock pain (2ry area of Pain) (Bilateral) (L>R)    Pain Score: Pre-procedure: 3 /10 Post-procedure: 0-No pain/10   Pre-op H&P  Assessment:  Matthew Maldonado is a 70 y.o. (year old), male patient, seen today for interventional treatment. He  has a past surgical history that includes Renal biopsy; Nasal septoplasty w/ turbinoplasty (Bilateral, 03/22/2018); Nasopharyngoscopy eustation tube balloon dilation (Bilateral, 03/22/2018); Nasal endoscopy (Bilateral, 03/22/2018); Excision nasal mass (Left, 03/22/2018); and Colonoscopy with propofol (N/A, 04/04/2019). Matthew Maldonado has a current medication list which includes the following prescription(s): atorvastatin, buspirone, esomeprazole, fluticasone, ketoconazole, metoprolol succinate, multivitamin with minerals, venlafaxine xr, diclofenac sodium, diclofenac sodium, oxycodone, and pregabalin. His primarily concern today is the Back Pain (lower)  Initial Vital Signs:  Pulse/HCG Rate: 66ECG Heart Rate: 64 Temp: (!) 97.5 F (36.4 C) Resp: 16 BP: 131/87 SpO2: 99 %  BMI: Estimated body mass index is 29.65 kg/m as calculated from the following:   Height as of this encounter: 5\' 8"  (1.727 m).   Weight as of this encounter: 195 lb (88.5 kg).  Risk Assessment: Allergies: Reviewed. He has No Known Allergies.  Allergy Precautions: None required Coagulopathies: Reviewed. None identified.  Blood-thinner therapy: None at this time Active Infection(s): Reviewed. None identified. Matthew Maldonado is afebrile  Site Confirmation: Matthew Maldonado was asked to confirm the procedure and laterality before marking the site Procedure checklist: Completed Consent: Before the procedure and under the influence of no sedative(s), amnesic(s), or anxiolytics, the patient was informed of the treatment options, risks and possible complications. To fulfill our ethical and legal obligations, as recommended by the American Medical Association's Code of Ethics, I have informed the patient of my clinical impression; the nature and purpose of the treatment or procedure; the risks, benefits, and possible complications of the intervention; the  alternatives, including doing nothing; the risk(s) and benefit(s) of the alternative treatment(s) or procedure(s); and the risk(s) and benefit(s) of doing nothing. The  patient was provided information about the general risks and possible complications associated with the procedure. These may include, but are not limited to: failure to achieve desired goals, infection, bleeding, organ or nerve damage, allergic reactions, paralysis, and death. In addition, the patient was informed of those risks and complications associated to Spine-related procedures, such as failure to decrease pain; infection (i.e.: Meningitis, epidural or intraspinal abscess); bleeding (i.e.: epidural hematoma, subarachnoid hemorrhage, or any other type of intraspinal or peri-dural bleeding); organ or nerve damage (i.e.: Any type of peripheral nerve, nerve root, or spinal cord injury) with subsequent damage to sensory, motor, and/or autonomic systems, resulting in permanent pain, numbness, and/or weakness of one or several areas of the body; allergic reactions; (i.e.: anaphylactic reaction); and/or death. Furthermore, the patient was informed of those risks and complications associated with the medications. These include, but are not limited to: allergic reactions (i.e.: anaphylactic or anaphylactoid reaction(s)); adrenal axis suppression; blood sugar elevation that in diabetics may result in ketoacidosis or comma; water retention that in patients with history of congestive heart failure may result in shortness of breath, pulmonary edema, and decompensation with resultant heart failure; weight gain; swelling or edema; medication-induced neural toxicity; particulate matter embolism and blood vessel occlusion with resultant organ, and/or nervous system infarction; and/or aseptic necrosis of one or more joints. Finally, the patient was informed that Medicine is not an exact science; therefore, there is also the possibility of unforeseen or  unpredictable risks and/or possible complications that may result in a catastrophic outcome. The patient indicated having understood very clearly. We have given the patient no guarantees and we have made no promises. Enough time was given to the patient to ask questions, all of which were answered to the patient's satisfaction. Matthew Maldonado has indicated that he wanted to continue with the procedure. Attestation: I, the ordering provider, attest that I have discussed with the patient the benefits, risks, side-effects, alternatives, likelihood of achieving goals, and potential problems during recovery for the procedure that I have provided informed consent. Date  Time: 07/28/2020  9:21 AM  Pre-Procedure Preparation:  Monitoring: As per clinic protocol. Respiration, ETCO2, SpO2, BP, heart rate and rhythm monitor placed and checked for adequate function Safety Precautions: Patient was assessed for positional comfort and pressure points before starting the procedure. Time-out: I initiated and conducted the "Time-out" before starting the procedure, as per protocol. The patient was asked to participate by confirming the accuracy of the "Time Out" information. Verification of the correct person, site, and procedure were performed and confirmed by me, the nursing staff, and the patient. "Time-out" conducted as per Joint Commission's Universal Protocol (UP.01.01.01). Time: 1004  Description of Procedure:          Area Prepped: Entire Posterior Lumbosacral Area DuraPrep (Iodine Povacrylex [0.7% available iodine] and Isopropyl Alcohol, 74% w/w) Safety Precautions: Aspiration looking for blood return was conducted prior to all injections. At no point did we inject any substances, as a needle was being advanced. No attempts were made at seeking any paresthesias. Safe injection practices and needle disposal techniques used. Medications properly checked for expiration dates. SDV (single dose vial) medications  used. Description of the Procedure: Protocol guidelines were followed. The patient was placed in position over the procedure table. The target area was identified and the area prepped in the usual manner. Skin & deeper tissues infiltrated with local anesthetic. Appropriate amount of time allowed to pass for local anesthetics to take effect. The procedure needles were then advanced to the  target area. Proper needle placement secured. Negative aspiration confirmed. Solution injected in intermittent fashion, asking for systemic symptoms every 0.5cc of injectate. The needles were then removed and the area cleansed, making sure to leave some of the prepping solution back to take advantage of its long term bactericidal properties.  Vitals:   07/28/20 1003 07/28/20 1008 07/28/20 1010 07/28/20 1011  BP: (!) 141/110 (!) 135/92 (!) 133/98 (!) 143/94  Pulse:      Resp: 14 (!) 8 (!) 9 17  Temp:      TempSrc:      SpO2: 96% 95% 96% 96%  Weight:      Height:        Start Time: 1004 hrs. End Time:   hrs.  Materials:  Needle(s) Type: Spinal Needle Gauge: 22G Length: 3.5-in Medication(s): Please see orders for medications and dosing details.  Imaging Guidance (Spinal):          Type of Imaging Technique: Fluoroscopy Guidance (Spinal) Indication(s): Assistance in needle guidance and placement for procedures requiring needle placement in or near specific anatomical locations not easily accessible without such assistance. Exposure Time: Please see nurses notes. Contrast: Before injecting any contrast, we confirmed that the patient did not have an allergy to iodine, shellfish, or radiological contrast. Once satisfactory needle placement was completed at the desired level, radiological contrast was injected. Contrast injected under live fluoroscopy. No contrast complications. See chart for type and volume of contrast used. Fluoroscopic Guidance: I was personally present during the use of fluoroscopy. "Tunnel  Vision Technique" used to obtain the best possible view of the target area. Parallax error corrected before commencing the procedure. "Direction-depth-direction" technique used to introduce the needle under continuous pulsed fluoroscopy. Once target was reached, antero-posterior, oblique, and lateral fluoroscopic projection used confirm needle placement in all planes. Images permanently stored in EMR. Interpretation: I personally interpreted the imaging intraoperatively. Adequate needle placement confirmed in multiple planes. Appropriate spread of contrast into desired area was observed. No evidence of afferent or efferent intravascular uptake. No intrathecal or subarachnoid spread observed. Permanent images saved into the patient's record.  Antibiotic Prophylaxis:   Anti-infectives (From admission, onward)   None     Indication(s): None identified  Post-operative Assessment:  Post-procedure Vital Signs:  Pulse/HCG Rate: 6664 Temp: (!) 97.5 F (36.4 C) Resp: 17 BP: (!) 143/94 SpO2: 96 %  EBL: None  Complications: No immediate post-treatment complications observed by team, or reported by patient.  Note: The patient tolerated the entire procedure well. A repeat set of vitals were taken after the procedure and the patient was kept under observation following institutional policy, for this type of procedure. Post-procedural neurological assessment was performed, showing return to baseline, prior to discharge. The patient was provided with post-procedure discharge instructions, including a section on how to identify potential problems. Should any problems arise concerning this procedure, the patient was given instructions to immediately contact us, at any time, without hesitation. In any case, we plan to contact the patient by telephone for a follow-up status report regarding this interventional procedure.  Comments:  No additional relevant information.  Plan of Care  Orders:  Orders Placed  This Encounter  Procedures  . Lumbar Transforaminal Epidural    Scheduling Instructions:     Side: Bilateral     Level: L3     Sedation: Patient's choice.     Timeframe: Today    Order Specific Question:   Where will this procedure be performed?    Answer:  [facility]  . [facility] C-ARM 1-60 MIN NO REPORT    Intraoperative interpretation by procedural physician at [place].    Standing Status:   Standing    Number of Occurrences:   1    Order Specific Question:   Reason for exam:    Answer:   Assistance in needle guidance and placement for procedures requiring needle placement in or near specific anatomical locations not easily accessible without such assistance.  . Informed Consent Details: Physician/Practitioner Attestation; Transcribe to consent Maldonado and obtain patient signature    Provider Attestation: I, Christine Dossie Arbour, MD, (Pain Management Specialist), the physician/practitioner, attest that I have discussed with the patient the benefits, risks, side effects, alternatives, likelihood of achieving goals and potential problems during recovery for the procedure that I have provided informed consent.    Scheduling Instructions:     Note: Always confirm laterality of pain with Matthew Maldonado, before procedure.     Transcribe to consent Maldonado and obtain patient signature.    Order Specific Question:   Physician/Practitioner attestation of informed consent for procedure/surgical case    Answer:   I, the physician/practitioner, attest that I have discussed with the patient the benefits, risks, side effects, alternatives, likelihood of achieving goals and potential problems during recovery for the procedure that I have provided informed consent.    Order Specific Question:   Procedure    Answer:   Diagnostic lumbar transforaminal epidural steroid injection under fluoroscopic guidance. (See notes for level and laterality.)    Order Specific Question:    Physician/Practitioner performing the procedure    Answer:   Ramirez Fullbright A. Dossie Arbour, MD    Order Specific Question:   Indication/Reason    Answer:   Lumbar radiculopathy/radiculitis associated with lumbar stenosis  . Provide equipment / supplies at bedside    "Epidural Tray" (Disposable  single use) Catheter: NOT required    Standing Status:   Standing    Number of Occurrences:   1    Order Specific Question:   Specify    Answer:   Epidural Tray   Chronic Opioid Analgesic:  Oxycodone IR 5 mg, 1-2 tabs PO HS (10 mg/day of oxycodone) MME/day: 15 mg/day.   Medications ordered for procedure: Meds ordered this encounter  Medications  . iohexol (OMNIPAQUE) 180 MG/ML injection 10 mL    Must be Myelogram-compatible. If not available, you may substitute with a water-soluble, non-ionic, hypoallergenic, myelogram-compatible radiological contrast medium.  Marland Kitchen lidocaine (XYLOCAINE) 2 % (with pres) injection 400 mg  . sodium chloride flush (NS) 0.9 % injection 2 mL  . ropivacaine (PF) 2 mg/mL (0.2%) (NAROPIN) injection 2 mL  . dexamethasone (DECADRON) injection 20 mg   Medications administered: We administered iohexol, lidocaine, sodium chloride flush, ropivacaine (PF) 2 mg/mL (0.2%), and dexamethasone.  See the medical record for exact dosing, route, and time of administration.  Follow-up plan:   Return in about 2 weeks (around 08/11/2020) for afternoon (VV) procedure day (PPE).       Interventional Therapies  Risk  Complexity Considerations:   WNL   Planned  Pending:   Diagnostic/therapeutic bilateral L3 TFESI #1    Under consideration:   Diagnostic/therapeutic left L2-3 LESI #1    Completed:   Therapeutic bilateral IA hip joint injection x2 (01/23/2020) Therapeutic bilateral gluteofemoral bursa injection x2 (01/23/2020) Therapeutic/palliative left L3-4 LESI x3 (07/09/2019)   Therapeutic  Palliative (PRN) options:   Therapeutic bilateral IA hip joint injection #2  Therapeutic  bilateral  gluteal femoral bursa injection #2  Therapeutic/palliative left L3-4 LESI #4     Recent Visits Date Type Provider Dept  07/13/20 Telemedicine [name], MD [facility]  05/13/20 Telemedicine [name], MD [facility]  05/05/20 Procedure visit [name], MD [facility]  Showing recent visits within past 90 days and meeting all other requirements Today's Visits Date Type Provider Dept  07/28/20 Procedure visit [name], MD [facility]  Showing today's visits and meeting all other requirements Future Appointments Date Type Provider Dept  08/13/20 Appointment [name], MD [facility]  Showing future appointments within next 90 days and meeting all other requirements  Disposition: Discharge home  Discharge (Date  Time): 07/28/2020;   hrs.   Primary Care Physician: Olin Hauser, DO Location: [facility] Note by: Gaspar Cola, MD Date: 07/28/2020; Time: 10:52 AM  Disclaimer:  Medicine is not an Chief Strategy Officer. The only guarantee in medicine is that nothing is guaranteed. It is important to note that the decision to proceed with this intervention was based on the information collected from the patient. The Data and conclusions were drawn from the patient's questionnaire, the interview, and the physical examination. Because the information was provided in large part by the patient, it cannot be guaranteed that it has not been purposely or unconsciously manipulated. Every effort has been made to obtain as much relevant data as possible for this evaluation. It is important to note that the conclusions that lead to this procedure are derived in large part from the available data. Always take into account that the treatment will also be dependent on availability of resources and existing treatment guidelines, considered by other Pain Management  Practitioners as being common knowledge and practice, at the time of the intervention. For Medico-Legal purposes, it is also important to point out that variation in procedural techniques and pharmacological choices are the acceptable norm. The indications, contraindications, technique, and results of the above procedure should only be interpreted and judged by a Board-Certified Interventional Pain Specialist with extensive familiarity and expertise in the same exact procedure and technique.

## 2020-07-29 ENCOUNTER — Telehealth: Payer: Self-pay

## 2020-07-29 NOTE — Telephone Encounter (Signed)
Post procedure phone call.  LM 

## 2020-07-31 ENCOUNTER — Other Ambulatory Visit: Payer: Self-pay | Admitting: Pain Medicine

## 2020-07-31 DIAGNOSIS — Z23 Encounter for immunization: Secondary | ICD-10-CM | POA: Diagnosis not present

## 2020-07-31 DIAGNOSIS — M792 Neuralgia and neuritis, unspecified: Secondary | ICD-10-CM

## 2020-08-10 ENCOUNTER — Other Ambulatory Visit: Payer: Self-pay | Admitting: Family Medicine

## 2020-08-10 DIAGNOSIS — F338 Other recurrent depressive disorders: Secondary | ICD-10-CM

## 2020-08-10 DIAGNOSIS — F411 Generalized anxiety disorder: Secondary | ICD-10-CM

## 2020-08-11 NOTE — Telephone Encounter (Signed)
  Notes to clinic:  courtesy refill already given and no appt has been scheduled  Review for another refill    Requested Prescriptions  Pending Prescriptions Disp Refills   busPIRone (BUSPAR) 15 MG tablet [Pharmacy Med Name: BUSPIRONE HCL 15 MG TAB] 60 tablet 0    Sig: TAKE 1 TABLET BY MOUTH TWICE DAILY      Psychiatry: Anxiolytics/Hypnotics - Non-controlled Failed - 08/10/2020 12:44 PM      Failed - Valid encounter within last 6 months    Recent Outpatient Visits           10 months ago Mixed hyperlipidemia   [place], DO   1 year ago BPPV (benign paroxysmal positional vertigo), bilateral   [place], DO   2 years ago Chronic bilateral low back pain with bilateral sciatica   [place], DO   2 years ago Primary osteoarthritis involving multiple joints   [place], DO   2 years ago GAD (generalized anxiety disorder)   [place], Devonne Doughty, DO       Future Appointments             In 2 days [place], [facility] [place]   In 2 days [name], MD [place]

## 2020-08-12 ENCOUNTER — Encounter: Payer: Self-pay | Admitting: Pain Medicine

## 2020-08-12 NOTE — Progress Notes (Signed)
Patient: Matthew Maldonado  Service Category: E/M  Provider: Gaspar Cola, MD  DOB: Dec 31, 1950  DOS: 08/13/2020  Location: Office  MRN: [id number]  Setting: Ambulatory outpatient  Referring Provider: Nobie Putnam *  Type: Established Patient  Specialty: Interventional Pain Management  PCP: Olin Hauser, DO  Location: Remote location  Delivery: TeleHealth     Virtual Encounter - Pain Management PROVIDER NOTE: Information contained herein reflects review and annotations entered in association with encounter. Interpretation of such information and data should be left to medically-trained personnel. Information provided to patient can be located elsewhere in the medical record under "Patient Instructions". Document created using STT-dictation technology, any transcriptional errors that may result from process are unintentional.    Contact & Pharmacy Preferred: [phone] Home: [phone] (home) Mobile: [phone] (mobile) E-mail: [email or web address]  [place], [place] [address] [place] Phone: [phone] Fax: [phone]   Pre-screening  [name] offered "in-person" vs "virtual" encounter. He indicated preferring virtual for this encounter.   Reason COVID-19*  Social distancing based on CDC and AMA recommendations.   I contacted Matthew Maldonado on 08/13/2020 via telephone.      I clearly identified myself as 70/04/2020, MD. I verified that I was speaking with the correct person using two identifiers (Name: Matthew Maldonado, and date of birth: [birth year]).  Consent I sought verbal advanced consent from 70/10/1950 for virtual visit interactions. I informed Matthew Maldonado of possible security and privacy concerns, risks, and limitations associated with providing "not-in-person" medical evaluation and management services. I also informed Matthew Maldonado of the availability of "in-person" appointments. Finally, I informed him that  there would be a charge for the virtual visit and that he could be  personally, fully or partially, financially responsible for it. Matthew Maldonado expressed understanding and agreed to proceed.   Historic Elements   [name] is a 70 y.o. year old, male patient evaluated today after our last contact on 07/31/2020. Matthew Maldonado  has a past medical history of Anxiety, Arthritis, Blood vessel disorder, BPH (benign prostatic hyperplasia), GERD (gastroesophageal reflux disease), Insomnia, Minimal change disease, Nocturia, PSVT (paroxysmal supraventricular tachycardia) ([place]), and TIA (transient ischemic attack). He also  has a past surgical history that includes Renal biopsy; Nasal septoplasty w/ turbinoplasty (Bilateral, 03/22/2018); Nasopharyngoscopy eustation tube balloon dilation (Bilateral, 03/22/2018); Nasal endoscopy (Bilateral, 03/22/2018); Excision nasal mass (Left, 03/22/2018); and Colonoscopy with propofol (N/A, 04/04/2019). Matthew Maldonado has a current medication list which includes the following prescription(s): atorvastatin, buspirone, diclofenac sodium, diclofenac sodium, esomeprazole, fluticasone, ketoconazole, metoprolol succinate, multivitamin with minerals, oxycodone, pregabalin, and venlafaxine xr. He  reports that he has quit smoking. His smoking use included cigars. He quit after 4.00 years of use. He has quit using smokeless tobacco. He reports current alcohol use of about 18.0 standard drinks of alcohol per week. He reports that he does not use drugs. Matthew Maldonado has No Known Allergies.   HPI  Today, he is being contacted for a post-procedure assessment.  The patient indicates complete relief solution of his low back and lower extremity symptoms with the bilateral L3 transforaminal epidural steroid injection.  Right now he refers enjoying an ongoing 100% relief of the pain.  At this point he refers not needing anything else from Matthew Maldonado.  The patient has been reminded to give [place] a call if and when the pain returns.  He  was also informed that based on his  condition, it is likely that eventually will.  He understood and accepted and he indicated that he would give [place] a call when he needs [place].  Post-Procedure Evaluation  Procedure (07/28/2020): Diagnostic/therapeutic bilateral L3 TFESI #1 under fluoroscopic guidance, no sedation Pre-procedure pain level: 3/10 Post-procedure: 0/10 (100% relief)  Sedation: None.  Effectiveness during initial hour after procedure(Ultra-Short Term Relief): 100 %.  Local anesthetic used: Long-acting (4-6 hours) Effectiveness: Defined as any analgesic benefit obtained secondary to the administration of local anesthetics. This carries significant diagnostic value as to the etiological location, or anatomical origin, of the pain. Duration of benefit is expected to coincide with the duration of the local anesthetic used.  Effectiveness during initial 4-6 hours after procedure(Short-Term Relief): 100 %.  Long-term benefit: Defined as any relief past the pharmacologic duration of the local anesthetics.  Effectiveness past the initial 6 hours after procedure(Long-Term Relief): 100 %.  Current benefits: Defined as benefit that persist at this time.   Analgesia:  The patient indicates having attained an ongoing 100% relief of his pain with a bilateral L3 transforaminal epidural steroid injection.  At this point, he refers having absolutely no back pain or lower extremity pain. Function: Matthew Maldonado reports improvement in function ROM: Matthew Maldonado reports improvement in ROM  Pharmacotherapy Assessment  Analgesic: Oxycodone IR 5 mg, 1-2 tabs PO HS (10 mg/day of oxycodone) MME/day: 15 mg/day.   Monitoring: [place] PMP: PDMP reviewed during this encounter.       Pharmacotherapy: No side-effects or adverse reactions reported. Compliance: No problems identified. Effectiveness: Clinically acceptable. Plan: Refer to "POC".  UDS:  Summary  Date Value Ref Range Status  08/01/2019 Note  Final     Comment:    ==================================================================== [facility] Select 13 (MW) ==================================================================== Test                             Result       Flag       Units Drug Present not Declared for Prescription Verification   Amphetamine                    585          UNEXPECTED ng/mg creat    Amphetamine is available as a schedule II prescription drug. Drug Absent but Declared for Prescription Verification   Oxycodone                      Not Detected UNEXPECTED ng/mg creat ==================================================================== Test                      Result    Flag   Units      Ref Range   Creatinine              84               mg/dL      >=20 ==================================================================== Declared Medications:  The flagging and interpretation on this report are based on the  following declared medications.  Unexpected results may arise from  inaccuracies in the declared medications.  **Note: The testing scope of this panel includes these medications:  Oxycodone  **Note: The testing scope of this panel does not include the  following reported medications:  Atorvastatin  Buspirone  Cyclobenzaprine  Esomeprazole (Nexium)  Fluticasone  Metoprolol  Multivitamin  Pregabalin  Topical  Topical Diclofenac  Venlafaxine ==================================================================== For clinical consultation, please  call [phone]. ====================================================================     Laboratory Chemistry Profile   Renal Lab Results  Component Value Date   BUN 26 (H) 10/14/2019   CREATININE 0.90 01/13/2020   BCR 28 (H) 10/14/2019   GFRAA 96 10/14/2019   GFRNONAA 83 10/14/2019     Hepatic Lab Results  Component Value Date   AST 23 10/14/2019   ALT 25 10/14/2019   ALBUMIN 4.6 12/26/2018   ALKPHOS 82 12/26/2018      Electrolytes Lab Results  Component Value Date   NA 141 10/14/2019   K 4.7 10/14/2019   CL 106 10/14/2019   CALCIUM 9.4 10/14/2019   MG 2.1 12/26/2018     Bone Lab Results  Component Value Date   25OHVITD1 43 12/26/2018   25OHVITD2 <1.0 12/26/2018   25OHVITD3 43 12/26/2018     Inflammation (CRP: Acute Phase) (ESR: Chronic Phase) Lab Results  Component Value Date   CRP 1 12/26/2018   ESRSEDRATE 16 12/26/2018       Note: Above Lab results reviewed.  Imaging  DG PAIN CLINIC C-ARM 1-60 MIN NO REPORT Fluoro was used, but no Radiologist interpretation will be provided.  Please refer to "NOTES" tab for provider progress note.  Assessment  There were no encounter diagnoses.  Plan of Care  Problem-specific:  No problem-specific Assessment & Plan notes found for this encounter.  Matthew Maldonado has a current medication list which includes the following long-term medication(s): atorvastatin, diclofenac sodium, diclofenac sodium, esomeprazole, fluticasone, metoprolol succinate, oxycodone, pregabalin, and venlafaxine xr.  Pharmacotherapy (Medications Ordered): No orders of the defined types were placed in this encounter.  Orders:  No orders of the defined types were placed in this encounter.  Follow-up plan:   Return if symptoms worsen or fail to improve.      Interventional Therapies  Risk  Complexity Considerations:   Estimated body mass index is 29.65 kg/m as calculated from the following:   Height as of 07/28/20: 5' 8"  (1.727 m).   Weight as of 07/28/20: 195 lb (88.5 kg). WNL   Planned  Pending:      Under consideration:   Diagnostic/therapeutic left L2-3 LESI #1    Completed:   Diagnostic/therapeutic bilateral L3 TFESI x1 (07/28/2020) (100/100/100/100)  Therapeutic bilateral IA hip joint injection x2 (01/23/2020) (100/100/70/70)  Therapeutic bilateral gluteofemoral bursa injection x2 (01/23/2020) (100/100/70/70)  Therapeutic/palliative left L3-4 LESI x4  (05/13/2020) (100/100/90/90-100) (100/100/0/<50)    Therapeutic  Palliative (PRN) options:   Therapeutic bilateral L3 TFESI #2  Therapeutic bilateral IA hip joint injection #2  Therapeutic bilateral gluteal femoral bursa injection #2  Therapeutic/palliative left L3-4 LESI #4     Recent Visits Date Type Provider Dept  07/28/20 Procedure visit [name], MD [facility]  07/13/20 Telemedicine [name], MD [facility]  Showing recent visits within past 90 days and meeting all other requirements Today's Visits Date Type Provider Dept  08/13/20 Telemedicine [name], MD [facility]  Showing today's visits and meeting all other requirements Future Appointments No visits were found meeting these conditions. Showing future appointments within next 90 days and meeting all other requirements  I discussed the assessment and treatment plan with the patient. The patient was provided an opportunity to ask questions and all were answered. The patient agreed with the plan and demonstrated an understanding of the instructions.  Patient advised to call back or seek an in-person evaluation if the symptoms or condition worsens.  Duration of encounter: 14 minutes.  Note by: Gaspar Cola, MD Date: 08/13/2020; Time: 6:59 PM

## 2020-08-12 NOTE — Progress Notes (Deleted)
08/13/2020 9:50 AM   Matthew Maldonado [birth year]  [id number]  Referring provider: Olin Hauser, DO [address] [place],  [place] [address]  No chief complaint on file.  Urological history: 1. BPH with LU TS -PSA 0.8 in 12/2019 -I PSS ***  2. ED -contributing factors of age, BPH, smoking, heart disease, antidepressents and lower back pain  3. High risk hematuria -smoker -CTU 01/2020 NED -cysto 02/2020 -BPH with hypervascularity and friability which is the most likely source of his microhematuria -UA ***  4. Renal cyst -2.0 cm cyst in the right kidney on CTU 01/2020  HPI: Matthew Maldonado is a 69 y.o. male who presents for 6 month follow up.     Score:  1-7 Mild 8-19 Moderate 20-35 Severe    PMH: Past Medical History:  Diagnosis Date  . Anxiety   . Arthritis    hand, knees, hip  . 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   Marland Kitchen BPH (benign prostatic hyperplasia)   . GERD (gastroesophageal reflux disease)   . Insomnia   . Minimal change disease    no issues for last 4-5 yrs  . Nocturia   . PSVT (paroxysmal supraventricular tachycardia) ([place])   . TIA (transient ischemic attack)    [facility]y [facility] - last MRI 2017 showing stenosed M1    Surgical History: Past Surgical History:  Procedure Laterality Date  . COLONOSCOPY WITH PROPOFOL N/A 04/04/2019   Procedure: COLONOSCOPY WITH PROPOFOL;  Surgeon: Jonathon Bellows, MD;  Location: [facility] ENDOSCOPY;  Service: Gastroenterology;  Laterality: N/A;  . EXCISION NASAL MASS Left 03/22/2018   Procedure: EXCISION nasopharyngeal lesion;  Surgeon: Margaretha Sheffield, MD;  Location: [place];  Service: ENT;  Laterality: Left;  . NASAL ENDOSCOPY Bilateral 03/22/2018   Procedure: NASAL ENDOSCOPY NASOPHARYNGESCOPY;  Surgeon: Margaretha Sheffield, MD;  Location: [place];  Service: ENT;  Laterality: Bilateral;  . NASAL SEPTOPLASTY W/ TURBINOPLASTY Bilateral 03/22/2018   Procedure:  NASAL SEPTOPLASTY WITH INFERIORTURBINATE REDUCTION;  Surgeon: Margaretha Sheffield, MD;  Location: [place];  Service: ENT;  Laterality: Bilateral;  . NASOPHARYNGOSCOPY EUSTATION TUBE BALLOON DILATION Bilateral 03/22/2018   Procedure: NASOPHARYNGOSCOPY EUSTATION TUBE BALLOON DILATION;  Surgeon: Margaretha Sheffield, MD;  Location: [place];  Service: ENT;  Laterality: Bilateral;  . RENAL BIOPSY      Home Medications:  Allergies as of 08/13/2020   No Known Allergies     Medication List       Accurate as of August 12, 2020  9:50 AM. If you have any questions, ask your nurse or doctor.        atorvastatin 80 MG tablet Commonly known as: LIPITOR Take 1 tablet (80 mg total) by mouth at bedtime.   busPIRone 15 MG tablet Commonly known as: BUSPAR TAKE 1 TABLET BY MOUTH TWICE DAILY   diclofenac Sodium 1 % Gel Commonly known as: VOLTAREN Apply 4 g topically 4 (four) times daily as needed.   Diclofenac Sodium 3 % Gel Place 4 g onto the skin 4 (four) times daily.   esomeprazole 20 MG capsule Commonly known as: NEXIUM TAKE 1 CAPSULE BY MOUTH ONCE DAILY AT 12NOON.   fluticasone 50 MCG/ACT nasal spray Commonly known as: FLONASE Place 2 sprays into both nostrils daily.   ketoconazole 2 % shampoo Commonly known as: NIZORAL APPLY TOPICALLY AS NEEDED IRRITATION TO SCALP 1-2 TIMES A WEEK   metoprolol succinate 25 MG  24 hr tablet Commonly known as: TOPROL-XL Take 1 tablet (25 mg total) by mouth daily.   multivitamin with minerals tablet Take 1 tablet by mouth daily.   oxyCODONE 5 MG immediate release tablet Commonly known as: Oxy IR/ROXICODONE Take 1-2 tablets (5-10 mg total) by mouth at bedtime as needed for severe pain. Must last 30 days.   pregabalin 150 MG capsule Commonly known as: LYRICA Take 1 capsule (150 mg total) by mouth at bedtime.   venlafaxine XR 75 MG 24 hr capsule Commonly known as: EFFEXOR-XR Take 1 capsule (75 mg total) by mouth daily.        Allergies: No Known Allergies  Family History: Family History  Problem Relation Age of Onset  . Osteoporosis Mother   . Heart disease Father   . Heart disease Brother   . Alcohol abuse Brother   . Kidney disease Neg Hx   . Prostate cancer Neg Hx     Social History:  reports that he has quit smoking. His smoking use included cigars. He quit after 4.00 years of use. He has quit using smokeless tobacco. He reports current alcohol use of about 18.0 standard drinks of alcohol per week. He reports that he does not use drugs.  ROS:  For pertinent review of systems please refer to history of present illness  Physical Exam: There were no vitals taken for this visit.  Constitutional:  Well nourished. Alert and oriented, No acute distress. HEENT: [place] AT, moist mucus membranes.  Trachea midline Cardiovascular: No clubbing, cyanosis, or edema. Respiratory: Normal respiratory effort, no increased work of breathing. GI: Abdomen is soft, non tender, non distended, no abdominal masses. Liver and spleen not palpable.  No hernias appreciated.  Stool sample for occult testing is not indicated.   GU: No CVA tenderness.  No bladder fullness or masses.  Patient with circumcised/uncircumcised phallus. ***Foreskin easily retracted***  Urethral meatus is patent.  No penile discharge. No penile lesions or rashes. Scrotum without lesions, cysts, rashes and/or edema.  Testicles are located scrotally bilaterally. No masses are appreciated in the testicles. Left and right epididymis are normal. Rectal: Patient with  normal sphincter tone. Anus and perineum without scarring or rashes. No rectal masses are appreciated. Prostate is approximately *** grams, *** nodules are appreciated. Seminal vesicles are normal. Skin: No rashes, bruises or suspicious lesions. Lymph: No inguinal adenopathy. Neurologic: Grossly intact, no focal deficits, moving all 4 extremities. Psychiatric: Normal mood and affect.  Laboratory  Data: Component     Latest Ref Rng & Units 01/18/2016 01/31/2017 12/26/2018 12/19/2019  Prostate Specific Ag, Serum     0.0 - 4.0 ng/mL 1.1 1.1 1.0 0.8   I have reviewed the labs.    Pertinent Imaging: No recent imaging  Assessment & Plan:    1. BPH with LUTS ***  2. Microscopic hematuria ***    No follow-ups on file.  [name], PA-C  [facility] [address] [place],  [address] [phone]

## 2020-08-13 ENCOUNTER — Ambulatory Visit: Payer: Medicare Other | Admitting: Urology

## 2020-08-13 ENCOUNTER — Other Ambulatory Visit: Payer: Self-pay

## 2020-08-13 ENCOUNTER — Ambulatory Visit: Payer: Medicare Other | Attending: Pain Medicine | Admitting: Pain Medicine

## 2020-08-13 DIAGNOSIS — M4316 Spondylolisthesis, lumbar region: Secondary | ICD-10-CM | POA: Diagnosis not present

## 2020-08-13 DIAGNOSIS — M5137 Other intervertebral disc degeneration, lumbosacral region: Secondary | ICD-10-CM | POA: Diagnosis not present

## 2020-08-13 DIAGNOSIS — N138 Other obstructive and reflux uropathy: Secondary | ICD-10-CM

## 2020-08-13 DIAGNOSIS — R319 Hematuria, unspecified: Secondary | ICD-10-CM

## 2020-08-13 DIAGNOSIS — M48061 Spinal stenosis, lumbar region without neurogenic claudication: Secondary | ICD-10-CM | POA: Diagnosis not present

## 2020-08-17 ENCOUNTER — Ambulatory Visit (INDEPENDENT_AMBULATORY_CARE_PROVIDER_SITE_OTHER): Payer: Medicare Other | Admitting: Family Medicine

## 2020-08-17 ENCOUNTER — Other Ambulatory Visit: Payer: Self-pay

## 2020-08-17 ENCOUNTER — Encounter: Payer: Self-pay | Admitting: Family Medicine

## 2020-08-17 VITALS — Ht 68.5 in | Wt 195.0 lb

## 2020-08-17 DIAGNOSIS — M792 Neuralgia and neuritis, unspecified: Secondary | ICD-10-CM

## 2020-08-17 DIAGNOSIS — G894 Chronic pain syndrome: Secondary | ICD-10-CM | POA: Diagnosis not present

## 2020-08-17 DIAGNOSIS — F5101 Primary insomnia: Secondary | ICD-10-CM | POA: Diagnosis not present

## 2020-08-17 MED ORDER — TRAZODONE HCL 50 MG PO TABS: 50.0000 mg | ORAL_TABLET | Freq: Every day | ORAL | 2 refills | Status: DC

## 2020-08-17 MED ORDER — PREGABALIN 150 MG PO CAPS: 150.0000 mg | ORAL_CAPSULE | Freq: Every day | ORAL | 2 refills | Status: DC

## 2020-08-17 NOTE — Progress Notes (Signed)
Virtual Visit via Telephone The purpose of this virtual visit is to provide medical care while limiting exposure to the novel coronavirus (COVID19) for both patient and office staff.  Consent was obtained for phone visit:  Yes.   Answered questions that patient had about telehealth interaction:  Yes.   I discussed the limitations, risks, security and privacy concerns of performing an evaluation and management service by telephone. I also discussed with the patient that there may be a patient responsible charge related to this service. The patient expressed understanding and agreed to proceed.  Patient Location: Home Provider Location: [facility] (Office)  Participants in virtual visit: - Patient: [name] - CMA: Orinda Kenner, CMA - Provider: Dr Parks Ranger  ---------------------------------------------------------------------- Chief Complaint  Patient presents with  . Insomnia  . Anxiety    S: Reviewed CMA documentation. I have called patient and gathered additional HPI as follows:  Insomnia History - Seasonal Affective Disorder (usually late Fall / Winter)  Chronic insomnia for years. 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 Now he is retired and he keeps thinking about his day to day list with home, housework, yard and vacations to do list etc. - He admits Sleep Onset insomnia, difficulty falling asleep. He describes will wake up every few hours overnight due to his bladder and makes it difficult falling back asleep. - He has tried OTC Melatonin every time 3-5 mg or higher, chewable, gummy etc -no effect or benefit. - He has tried OTC Benadryl PRN if truly needed but not taking regularly.  He has been on Hydroxyzine, Venlafaxine, Buspar previously.  Tomorrow apt with Urology BUA, previously had been evaluated, considered Myrbetriq but  unable to take due to high cost.  He used to take Lunesta years ago. Wake up groggy did not feel well with this.   Chronic Pain Syndrome Neuropathic Pain / Sciatica Followed by Dr Dossie Arbour On pain medication opiates, and injection therapy He has been on pregabalin 150mg  nightly, he was asked to have this transferred to our PCP office for refills in future.   Denies any known or suspected exposure to person with or possibly with COVID19.  Denies any fevers, chills, sweats, body ache, cough, shortness of breath, sinus pain or pressure, headache, abdominal pain, diarrhea  Past Medical History:  Diagnosis Date  . Anxiety   . Arthritis    hand, knees, hip  . 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   Marland Kitchen BPH (benign prostatic hyperplasia)   . GERD (gastroesophageal reflux disease)   . Insomnia   . Minimal change disease    no issues for last 4-5 yrs  . Nocturia   . PSVT (paroxysmal supraventricular tachycardia) ([place])   . TIA (transient ischemic attack)    Sees neurology at [facility] - last MRI 2017 showing stenosed M1   Social History   Tobacco Use  . Smoking status: Former Smoker    Years: 4.00    Types: Cigars  . Smokeless tobacco: Former Systems developer  . Tobacco comment: 1 or 2 a day   Vaping Use  . Vaping Use: Never used  Substance Use Topics  . Alcohol use: Yes    Alcohol/week: 16.0 standard drinks    Types: 1 Cans of beer, 3 Standard drinks or equivalent,  12 Shots of liquor per week    Comment: 2 drinks per night depending on the day   . Drug use: No    Current Outpatient Medications:  .  atorvastatin (LIPITOR) 80 MG tablet, Take 1 tablet (80 mg total) by mouth at bedtime., Disp: 90 tablet, Rfl: 3 .  busPIRone (BUSPAR) 15 MG tablet, TAKE 1 TABLET BY MOUTH TWICE DAILY, Disp: 180 tablet, Rfl: 0 .  esomeprazole (NEXIUM) 20 MG capsule, TAKE 1 CAPSULE BY MOUTH ONCE DAILY AT 12NOON., Disp: 90 capsule, Rfl: 0 .  fluticasone (FLONASE) 50 MCG/ACT  nasal spray, Place 2 sprays into both nostrils daily., Disp: 48 g, Rfl: 3 .  ketoconazole (NIZORAL) 2 % shampoo, APPLY TOPICALLY AS NEEDED IRRITATION TO SCALP 1-2 TIMES A WEEK, Disp: 120 mL, Rfl: 1 .  metoprolol succinate (TOPROL-XL) 25 MG 24 hr tablet, Take 1 tablet (25 mg total) by mouth daily., Disp: 90 tablet, Rfl: 3 .  Multiple Vitamins-Minerals (MULTIVITAMIN WITH MINERALS) tablet, Take 1 tablet by mouth daily., Disp: , Rfl:  .  traZODone (DESYREL) 50 MG tablet, Take 1 tablet (50 mg total) by mouth at bedtime., Disp: 30 tablet, Rfl: 2 .  venlafaxine XR (EFFEXOR-XR) 75 MG 24 hr capsule, Take 1 capsule (75 mg total) by mouth daily., Disp: 90 capsule, Rfl: 3 .  diclofenac Sodium (VOLTAREN) 1 % GEL, Apply 4 g topically 4 (four) times daily as needed., Disp: 350 g, Rfl: PRN .  Diclofenac Sodium 3 % GEL, Place 4 g onto the skin 4 (four) times daily., Disp: 100 g, Rfl: 10 .  oxyCODONE (OXY IR/ROXICODONE) 5 MG immediate release tablet, Take 1-2 tablets (5-10 mg total) by mouth at bedtime as needed for severe pain. Must last 30 days., Disp: 30 tablet, Rfl: 0 .  pregabalin (LYRICA) 150 MG capsule, Take 1 capsule (150 mg total) by mouth at bedtime., Disp: 30 capsule, Rfl: 2  Depression screen [facility] 2/9 08/17/2020 10/14/2019 11/07/2018  Decreased Interest 0 0 2  Down, Depressed, Hopeless 0 0 2  PHQ - 2 Score 0 0 4  Altered sleeping 3 1 2   Tired, decreased energy 3 2 3   Change in appetite 2 1 2   Feeling bad or failure about yourself  0 0 1  Trouble concentrating 2 0 0  Moving slowly or fidgety/restless 0 0 0  Suicidal thoughts 0 0 0  PHQ-9 Score 10 4 12   Difficult doing work/chores Somewhat difficult Somewhat difficult Not difficult at all  Some recent data might be hidden    GAD 7 : Generalized Anxiety Score 08/17/2020 10/14/2019 11/07/2018 06/22/2018  Nervous, Anxious, on Edge 2 0 3 0  Control/stop worrying 1 0 3 0  Worry too much - different things 2 0 3 0  Trouble relaxing 3 0 3 0  Restless 2 0 2 0   Easily annoyed or irritable 3 2 3  0  Afraid - awful might happen 2 1 2  0  Total GAD 7 Score 15 3 19  0  Anxiety Difficulty Somewhat difficult Not difficult at all Not difficult at all Not difficult at all    -------------------------------------------------------------------------- O: No physical exam performed due to remote telephone encounter.  Lab results reviewed.  No results found for this or any previous visit (from the past 2160 hour(s)).  -------------------------------------------------------------------------- A&P:  Problem List Items Addressed This Visit    Primary insomnia - Primary   Relevant Medications   traZODone (DESYREL) 50 MG tablet   Neurogenic pain (Chronic)   Relevant Medications  pregabalin (LYRICA) 150 MG capsule   Chronic pain syndrome (Chronic)   Relevant Medications   traZODone (DESYREL) 50 MG tablet   pregabalin (LYRICA) 150 MG capsule     Insomnia Primary etiology seems, some component related to pain and also with bladder nocturia interfering He also has Seasonal Affective / Anxiety components but these are not active, he has had longer course of just primary insomnia Previously on Trazodone 2016-2019 approx or earlier, unsure results on this Failed Melatonin OTC dosages, Sleep Hygiene Failed Lunesta in past with side effect grogginess Improved on benadryl, hydroxyzine PRN  Will retry Trazodone 50mg  nightly for maintenance sleep med, can dose adjust in future Also return to Urology has apt this week for further evaluation, as discussed may have problem with BPH Nocturia that wakes him up and disrupts sleep. If this is fixed may need less sleep medication in future  #Chronic Pain / Neuropathic Pain / Back pain Sciatica Followed by [facility] Pain Management Dr Dossie Arbour He manages his opiate pain medicine and injection therapy. He has asked our office to manage his non-opioid medications. Will agree to refill this as patient is continued to be  followed by Dr Dossie Arbour - send rx Pregabalin 150mg  nightly 30 pills + 2 refills   Meds ordered this encounter  Medications  . traZODone (DESYREL) 50 MG tablet    Sig: Take 1 tablet (50 mg total) by mouth at bedtime.    Dispense:  30 tablet    Refill:  2  . pregabalin (LYRICA) 150 MG capsule    Sig: Take 1 capsule (150 mg total) by mouth at bedtime.    Dispense:  30 capsule    Refill:  2    Follow-up: - Return in 3 months for med refills Lyrica, Insomnia  Patient verbalizes understanding with the above medical recommendations including the limitation of remote medical advice.  Specific follow-up and call-back criteria were given for patient to follow-up or seek medical care more urgently if needed.   - Time spent in direct consultation with patient on phone: 15 minutes  Nobie Putnam, [facility] 08/17/2020, 4:19 PM

## 2020-08-17 NOTE — Progress Notes (Signed)
08/18/2020 1:37 PM   Matthew Maldonado [birth year]  [id number]  Referring provider: Olin Hauser, DO [address] [place],   [id number]  Chief Complaint  Patient presents with  . Benign Prostatic Hypertrophy   Urological history: 1. BPH with LU TS -PSA 0.8 in 12/2019 -I PSS 24/5  2. ED -contributing factors of age, BPH, smoking, heart disease, antidepressents and lower back pain  3. High risk hematuria -smoker -CTU 01/2020 NED -cysto 02/2020 -BPH with hypervascularity and friability which is the most likely source of his microhematuria -UA negative for micro heme  4. Renal cyst -2.0 cm cyst in the right kidney on CTU 01/2020  HPI: Matthew Maldonado is a 70 y.o. male who presents for 6 month follow up.    IPSS    Row Name 08/18/20 1300         International Prostate Symptom Score   How often have you had the sensation of not emptying your bladder? About half the time     How often have you had to urinate less than every two hours? More than half the time     How often have you found you stopped and started again several times when you urinated? More than half the time     How often have you found it difficult to postpone urination? More than half the time     How often have you had a weak urinary stream? More than half the time     How often have you had to strain to start urination? Less than half the time     How many times did you typically get up at night to urinate? 3 Times     Total IPSS Score 24           Quality of Life due to urinary symptoms   If you were to spend the rest of your life with your urinary condition just the way it is now how would you feel about that? Unhappy            Score:  1-7 Mild 8-19 Moderate 20-35 Severe    PMH: Past Medical History:  Diagnosis Date  . Anxiety   . Arthritis    hand, knees, hip  . 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   Marland Kitchen BPH (benign prostatic  hyperplasia)   . GERD (gastroesophageal reflux disease)   . Insomnia   . Minimal change disease    no issues for last 4-5 yrs  . Nocturia   . PSVT (paroxysmal supraventricular tachycardia) ([place])   . TIA (transient ischemic attack)    Sees neurology at [facility] - last MRI 2017 showing stenosed M1    Surgical History: Past Surgical History:  Procedure Laterality Date  . COLONOSCOPY WITH PROPOFOL N/A 04/04/2019   Procedure: COLONOSCOPY WITH PROPOFOL;  Surgeon: Jonathon Bellows, MD;  Location: [facility] ENDOSCOPY;  Service: Gastroenterology;  Laterality: N/A;  . EXCISION NASAL MASS Left 03/22/2018   Procedure: EXCISION nasopharyngeal lesion;  Surgeon: Margaretha Sheffield, MD;  Location: [place];  Service: ENT;  Laterality: Left;  . NASAL ENDOSCOPY Bilateral 03/22/2018   Procedure: NASAL ENDOSCOPY NASOPHARYNGESCOPY;  Surgeon: Margaretha Sheffield, MD;  Location: [place];  Service: ENT;  Laterality: Bilateral;  . NASAL SEPTOPLASTY W/ TURBINOPLASTY Bilateral 03/22/2018   Procedure: NASAL SEPTOPLASTY WITH INFERIORTURBINATE REDUCTION;  Surgeon: Margaretha Sheffield, MD;  Location: [place];  Service: ENT;  Laterality: Bilateral;  . NASOPHARYNGOSCOPY EUSTATION TUBE BALLOON DILATION Bilateral 03/22/2018   Procedure: NASOPHARYNGOSCOPY EUSTATION TUBE BALLOON DILATION;  Surgeon: Margaretha Sheffield, MD;  Location: [place];  Service: ENT;  Laterality: Bilateral;  . RENAL BIOPSY      Home Medications:  Allergies as of 08/18/2020   No Known Allergies     Medication List       Accurate as of August 18, 2020  1:37 PM. If you have any questions, ask your nurse or doctor.        atorvastatin 80 MG tablet Commonly known as: LIPITOR Take 1 tablet (80 mg total) by mouth at bedtime.   busPIRone 15 MG tablet Commonly known as: BUSPAR TAKE 1 TABLET BY MOUTH TWICE DAILY   diclofenac Sodium 1 % Gel Commonly known as: VOLTAREN Apply 4 g topically 4 (four) times daily as needed.   Diclofenac Sodium 3 %  Gel Place 4 g onto the skin 4 (four) times daily.   esomeprazole 20 MG capsule Commonly known as: NEXIUM TAKE 1 CAPSULE BY MOUTH ONCE DAILY AT 12NOON.   fluticasone 50 MCG/ACT nasal spray Commonly known as: FLONASE Place 2 sprays into both nostrils daily.   ketoconazole 2 % shampoo Commonly known as: NIZORAL APPLY TOPICALLY AS NEEDED IRRITATION TO SCALP 1-2 TIMES A WEEK   metoprolol succinate 25 MG 24 hr tablet Commonly known as: TOPROL-XL Take 1 tablet (25 mg total) by mouth daily.   mirabegron ER 50 MG Tb24 tablet Commonly known as: MYRBETRIQ Take 1 tablet (50 mg total) by mouth daily. Started by: [name], PA-C   multivitamin with minerals tablet Take 1 tablet by mouth daily.   oxyCODONE 5 MG immediate release tablet Commonly known as: Oxy IR/ROXICODONE Take 1-2 tablets (5-10 mg total) by mouth at bedtime as needed for severe pain. Must last 30 days.   pregabalin 150 MG capsule Commonly known as: LYRICA Take 1 capsule (150 mg total) by mouth at bedtime.   traZODone 50 MG tablet Commonly known as: DESYREL Take 1 tablet (50 mg total) by mouth at bedtime.   venlafaxine XR 75 MG 24 hr capsule Commonly known as: EFFEXOR-XR Take 1 capsule (75 mg total) by mouth daily.       Allergies: No Known Allergies  Family History: Family History  Problem Relation Age of Onset  . Osteoporosis Mother   . Heart disease Father   . Heart disease Brother   . Alcohol abuse Brother   . Kidney disease Neg Hx   . Prostate cancer Neg Hx     Social History:  reports that he has quit smoking. His smoking use included cigars. He quit after 4.00 years of use. He has quit using smokeless tobacco. He reports current alcohol use of about 16.0 standard drinks of alcohol per week. He reports that he does not use drugs.  ROS:  For pertinent review of systems please refer to history of present illness  Physical Exam: BP (!) 144/88   Pulse 65   Ht 5\' 8"  (1.727 m)   Wt 195 lb  (88.5 kg)   BMI 29.65 kg/m   Constitutional:  Well nourished. Alert and oriented, No acute distress. HEENT: [place] AT, mask in place.  Trachea midline Cardiovascular: No clubbing, cyanosis, or edema. Respiratory: Normal respiratory effort, no increased work of breathing. Neurologic: Grossly intact, no focal deficits, moving all 4 extremities. Psychiatric: Normal mood and affect.  Laboratory Data: Component     Latest Ref Rng & Units 01/18/2016  01/31/2017 12/26/2018 12/19/2019  Prostate Specific Ag, Serum     0.0 - 4.0 ng/mL 1.1 1.1 1.0 0.8   Urinalysis Component     Latest Ref Rng & Units 08/18/2020  Specific Gravity, UA     1.005 - 1.030 1.015  pH, UA     5.0 - 7.5 7.0  Color, UA     Yellow Yellow  Appearance Ur     Clear Clear  Leukocytes,UA     Negative Negative  Protein,UA     Negative/Trace Negative  Glucose, UA     Negative Negative  Ketones, UA     Negative Negative  RBC, UA     Negative Negative  Bilirubin, UA     Negative Negative  Urobilinogen, Ur     0.2 - 1.0 mg/dL 1.0  Nitrite, UA     Negative Negative  Microscopic Examination      See below:   Component     Latest Ref Rng & Units 08/18/2020  WBC, UA     0 - 5 /hpf None seen  RBC     0 - 2 /hpf 0-2  Epithelial Cells (non renal)     0 - 10 /hpf None seen  Bacteria, UA     None seen/Few None seen  I have reviewed the labs.    Pertinent Imaging: No recent imaging  Assessment & Plan:    1. BPH with LUTS -His biggest issue is nocturia for which the Myrbetriq was effective, but the last time we prescribed it was cost prohibitive  -According to Epic, his insurance will cover the medication -I sent a prescription for Myrbetriq 50 mg to take once daily to [facility] -I also gave him samples of Myrbetriq 50 mg to take in case we need to obtain a prior authorization for him to continue the medication -We also discussed PTNS and Botox therapy if it still happens his insurance still does not cover the  Myrbetriq -He will contact the office regarding the Myrbetriq coverage.  If his insurance will cover the Myrbetriq, we will schedule him for 82-month follow-up.  If his insurance will not cover the Myrbetriq, I have given him information regarding both the PTNS and Botox therapy and he will let us know which when he prefers  2. Microscopic hematuria -resolved 11 1 malic   Return for patient to call .  [name], PA-C  [facility] [address] [place], [place] [address] [phone]

## 2020-08-18 ENCOUNTER — Ambulatory Visit (INDEPENDENT_AMBULATORY_CARE_PROVIDER_SITE_OTHER): Payer: Medicare Other | Admitting: Urology

## 2020-08-18 ENCOUNTER — Encounter: Payer: Self-pay | Admitting: Urology

## 2020-08-18 VITALS — BP 144/88 | HR 65 | Ht 68.0 in | Wt 195.0 lb

## 2020-08-18 DIAGNOSIS — N138 Other obstructive and reflux uropathy: Secondary | ICD-10-CM

## 2020-08-18 DIAGNOSIS — M75101 Unspecified rotator cuff tear or rupture of right shoulder, not specified as traumatic: Secondary | ICD-10-CM | POA: Diagnosis not present

## 2020-08-18 DIAGNOSIS — N401 Enlarged prostate with lower urinary tract symptoms: Secondary | ICD-10-CM

## 2020-08-18 DIAGNOSIS — R3129 Other microscopic hematuria: Secondary | ICD-10-CM

## 2020-08-18 LAB — URINALYSIS, COMPLETE
Bilirubin, UA: NEGATIVE
Glucose, UA: NEGATIVE
Ketones, UA: NEGATIVE
Leukocytes,UA: NEGATIVE
Nitrite, UA: NEGATIVE
Protein,UA: NEGATIVE
RBC, UA: NEGATIVE
Specific Gravity, UA: 1.015 (ref 1.005–1.030)
Urobilinogen, Ur: 1 mg/dL (ref 0.2–1.0)
pH, UA: 7 (ref 5.0–7.5)

## 2020-08-18 LAB — MICROSCOPIC EXAMINATION
Bacteria, UA: NONE SEEN
Epithelial Cells (non renal): NONE SEEN /hpf (ref 0–10)
WBC, UA: NONE SEEN /hpf (ref 0–5)

## 2020-08-18 MED ORDER — MIRABEGRON ER 50 MG PO TB24: 50.0000 mg | ORAL_TABLET | Freq: Every day | ORAL | 3 refills | Status: AC

## 2020-08-31 DIAGNOSIS — S46011A Strain of muscle(s) and tendon(s) of the rotator cuff of right shoulder, initial encounter: Secondary | ICD-10-CM | POA: Diagnosis not present

## 2020-09-03 DIAGNOSIS — D2372 Other benign neoplasm of skin of left lower limb, including hip: Secondary | ICD-10-CM | POA: Diagnosis not present

## 2020-09-08 ENCOUNTER — Other Ambulatory Visit: Payer: Self-pay | Admitting: Family Medicine

## 2020-09-08 DIAGNOSIS — F5101 Primary insomnia: Secondary | ICD-10-CM

## 2020-09-08 DIAGNOSIS — F411 Generalized anxiety disorder: Secondary | ICD-10-CM

## 2020-09-08 DIAGNOSIS — F338 Other recurrent depressive disorders: Secondary | ICD-10-CM

## 2020-09-10 DIAGNOSIS — H61002 Unspecified perichondritis of left external ear: Secondary | ICD-10-CM | POA: Diagnosis not present

## 2020-09-17 DIAGNOSIS — S46011A Strain of muscle(s) and tendon(s) of the rotator cuff of right shoulder, initial encounter: Secondary | ICD-10-CM | POA: Diagnosis not present

## 2020-09-21 DIAGNOSIS — Z6829 Body mass index (BMI) 29.0-29.9, adult: Secondary | ICD-10-CM | POA: Diagnosis not present

## 2020-09-21 DIAGNOSIS — M160 Bilateral primary osteoarthritis of hip: Secondary | ICD-10-CM | POA: Diagnosis not present

## 2020-09-24 ENCOUNTER — Encounter: Payer: Self-pay | Admitting: Internal Medicine

## 2020-09-24 ENCOUNTER — Other Ambulatory Visit: Payer: Self-pay

## 2020-09-24 ENCOUNTER — Ambulatory Visit (INDEPENDENT_AMBULATORY_CARE_PROVIDER_SITE_OTHER): Payer: Medicare Other | Admitting: Internal Medicine

## 2020-09-24 VITALS — BP 105/78 | HR 67 | Temp 97.3°F | Resp 18 | Ht 68.0 in | Wt 198.0 lb

## 2020-09-24 DIAGNOSIS — J329 Chronic sinusitis, unspecified: Secondary | ICD-10-CM | POA: Diagnosis not present

## 2020-09-24 DIAGNOSIS — B9789 Other viral agents as the cause of diseases classified elsewhere: Secondary | ICD-10-CM | POA: Diagnosis not present

## 2020-09-24 MED ORDER — PREDNISONE 10 MG PO TABS: ORAL_TABLET | ORAL | 0 refills | Status: AC

## 2020-09-24 NOTE — Progress Notes (Signed)
HPI  Pt presents to the clinic today with c/o headache, facial pain and pressure, bilateral ear pressure, runny nose and nasal congestion. He reports this started 3-4 days ago. He headache is located in his forehead. He describes the pain as pressure. He is having facial pain in his left cheek. His blowing clear mucous out of his nose. He denies decreased  hearing or drainage from the ears. He denies sore throat, cough or chest congestion. He denies fever, chills or body aches. He has tried Press photographer, Chief Strategy Officer with minimal relief. He denies known exposure to Covid that he is aware of. He does not smoke.   Review of Systems     Past Medical History:  Diagnosis Date   Anxiety    Arthritis    hand, knees, hip   Blood vessel disorder    Constricted, not able to be operated on in back of brain.    BPH (benign prostatic hyperplasia)    GERD (gastroesophageal reflux disease)    Insomnia    Minimal change disease    no issues for last 4-5 yrs   Nocturia    PSVT (paroxysmal supraventricular tachycardia) ([facility])    TIA (transient ischemic attack)    Sees neurology at [facility] - last MRI 2017 showing stenosed M1    Family History  Problem Relation Age of Onset   Osteoporosis Mother    Heart disease Father    Heart disease Brother    Alcohol abuse Brother    Kidney disease Neg Hx    Prostate cancer Neg Hx     Social History   Socioeconomic History   Marital status: Married    Spouse name: quein   Number of children: 0   Years of education: Not on file   Highest education level: Some college, no degree  Occupational History    Comment: retired  Tobacco Use   Smoking status: Former    Types: Cigars   Smokeless tobacco: Former   Tobacco comments:    1 or 2 a day   Vaping Use   Vaping Use: Never used  Substance and Sexual Activity   Alcohol use: Yes    Alcohol/week: 16.0 standard drinks    Types: 1 Cans of beer, 3 Standard drinks or equivalent, 12 Shots of liquor per week     Comment: 2 drinks per night depending on the day    Drug use: No   Sexual activity: Not Currently  Other Topics Concern   Not on file  Social History Narrative   Not on file   Social Determinants of Health   Financial Resource Strain: Not on file  Food Insecurity: Not on file  Transportation Needs: Not on file  Physical Activity: Not on file  Stress: Not on file  Social Connections: Not on file  Intimate Partner Violence: Not on file    No Known Allergies   Constitutional: Positive headache. Denies fatigue, fever or abrupt weight changes.  HEENT:  Positive facial pain and pressure, bilateral ear fullness, runny nose, nasal congestion.  Denies eye redness, ear pain, ringing in the ears, wax buildup, or bloody nose. Respiratory: Denies difficulty breathing, cough  or shortness of breath.  Cardiovascular: Denies chest pain, chest tightness, palpitations or swelling in the hands or feet.   No other specific complaints in a complete review of systems (except as listed in HPI above).  Objective:   BP 105/78 (BP Location: Left Arm, Patient Position: Sitting, Cuff Size: Large)   Pulse  67   Temp (!) 97.3 F (36.3 C) (Temporal)   Resp 18   Ht 5\' 8"  (1.727 m)   Wt 198 lb (89.8 kg)   SpO2 99%   BMI 30.11 kg/m    General: Appears his stated age, obese, in NAD. HEENT: Head: normal shape and size, left maxillary sinus tenderness noted; Eyes: sclera white, no icterus, conjunctiva pink; Ears: Tm's gray and intact, normal light reflex;, + serous effusion on the left Neck:  No adenopathy noted.  Cardiovascular: Normal rate and rhythm.  Pulmonary/Chest: Normal effort and positive vesicular breath sounds. No respiratory distress. No wheezes, rales or ronchi noted.       Assessment & Plan:   Viral Sinusitis  Can use a Neti Pot which can be purchased from your local drug store. Stop OTC meds eRx for Pred Taper x 6 days No indication for abx at this time  RTC as needed or if  symptoms persist. Webb Silversmith, NP This visit occurred during the SARS-CoV-2 public health emergency.  Safety protocols were in place, including screening questions prior to the visit, additional usage of staff PPE, and extensive cleaning of exam room while observing appropriate contact time as indicated for disinfecting solutions.

## 2020-09-25 NOTE — Patient Instructions (Signed)

## 2020-10-02 ENCOUNTER — Ambulatory Visit: Payer: Self-pay

## 2020-10-02 ENCOUNTER — Telehealth: Payer: Self-pay | Admitting: Family Medicine

## 2020-10-02 NOTE — Telephone Encounter (Signed)
Pt. Reports he was just seen 09/24/20 with "sinus issues and I completed the steroids." Reports he is still have sinus pain/pressure at left cheek below eye. Woke up with sore throat this morning. Drainage from nose is clear. Requests advice on what he should do or take now. Please advise.

## 2020-10-02 NOTE — Telephone Encounter (Signed)
Patient called in with sore throat from sinus issues. He spoke with nurse yesteday, but woke up today with sore throat issue. He wants to know what he can do for this. Please call back  Answer Assessment - Initial Assessment Questions 1. LOCATION: "Where does it hurt?"      Left cheek, sore throat 2. ONSET: "When did the sinus pain start?"  (e.g., hours, days)      Last week 3. SEVERITY: "How bad is the pain?"   (Scale 1-10; mild, moderate or severe)   - MILD (1-3): doesn't interfere with normal activities    - MODERATE (4-7): interferes with normal activities (e.g., work or school) or awakens from sleep   - SEVERE (8-10): excruciating pain and patient unable to do any normal activities        Moderate 4. RECURRENT SYMPTOM: "Have you ever had sinus problems before?" If Yes, ask: "When was the last time?" and "What happened that time?"      Yes 5. NASAL CONGESTION: "Is the nose blocked?" If Yes, ask: "Can you open it or must you breathe through your mouth?"     Can breath 6. NASAL DISCHARGE: "Do you have discharge from your nose?" If so ask, "What color?"     Clear 7. FEVER: "Do you have a fever?" If Yes, ask: "What is it, how was it measured, and when did it start?"      No 8. OTHER SYMPTOMS: "Do you have any other symptoms?" (e.g., sore throat, cough, earache, difficulty breathing)     Sore throat 9. PREGNANCY: "Is there any chance you are pregnant?" "When was your last menstrual period?"     N/A  Protocols used: Sinus Pain or Congestion-A-AH

## 2020-10-05 ENCOUNTER — Telehealth: Payer: Self-pay

## 2020-10-05 MED ORDER — AMOXICILLIN-POT CLAVULANATE 875-125 MG PO TABS: 1.0000 | ORAL_TABLET | Freq: Two times a day (BID) | ORAL | 0 refills | Status: DC

## 2020-10-05 NOTE — Telephone Encounter (Signed)
The pt was notified that an abx was sent over.

## 2020-10-05 NOTE — Telephone Encounter (Signed)
Patient called today 10/05/20 upset because he was not called back Friday.  This message was not sent to office until 3:45 on Friday afternoon.  Patient finished to prednisone taper and reported that he has gotten some relief, however, he feels that he needs additional mediation.    There are nurse triage notes in patients chart from 10/02/20.    Please advise

## 2020-10-05 NOTE — Telephone Encounter (Signed)
Copied from [place] [id number]. Topic: General - Other >> Oct 02, 2020  3:45 PM Pawlus, Brayton Layman A wrote: Reason for CRM: Pt called in and stated someone from the office was supposed to call him back regarding his sinus pressure and if any medications are being sent in for him.

## 2020-10-05 NOTE — Addendum Note (Signed)
Addended by: Jearld Fenton on: 10/05/2020 12:15 PM   Modules accepted: Orders

## 2020-10-05 NOTE — Telephone Encounter (Signed)
I called the pt and left a message to schedule him for a virtual visit with Dr Raliegh Ip. He may schedule for this week incase he calls back.

## 2020-10-05 NOTE — Telephone Encounter (Signed)
Augmentin sent to pharmacy.

## 2020-10-19 ENCOUNTER — Other Ambulatory Visit: Payer: Self-pay | Admitting: Family Medicine

## 2020-10-19 DIAGNOSIS — Z872 Personal history of diseases of the skin and subcutaneous tissue: Secondary | ICD-10-CM | POA: Diagnosis not present

## 2020-10-19 DIAGNOSIS — L920 Granuloma annulare: Secondary | ICD-10-CM | POA: Diagnosis not present

## 2020-10-19 DIAGNOSIS — Z86018 Personal history of other benign neoplasm: Secondary | ICD-10-CM | POA: Diagnosis not present

## 2020-10-19 DIAGNOSIS — L570 Actinic keratosis: Secondary | ICD-10-CM | POA: Diagnosis not present

## 2020-10-19 DIAGNOSIS — L219 Seborrheic dermatitis, unspecified: Secondary | ICD-10-CM

## 2020-10-19 DIAGNOSIS — Z859 Personal history of malignant neoplasm, unspecified: Secondary | ICD-10-CM | POA: Diagnosis not present

## 2020-10-19 DIAGNOSIS — H61002 Unspecified perichondritis of left external ear: Secondary | ICD-10-CM | POA: Diagnosis not present

## 2020-11-20 ENCOUNTER — Telehealth: Payer: Self-pay | Admitting: Family Medicine

## 2020-11-20 NOTE — Telephone Encounter (Signed)
Left message for patient to call back and schedule the Medicare Annual Wellness Visit (AWV) virtually or by telephone.  Last AWV 08/02/17  Please schedule at anytime with [place].  40 minute appointment  Any questions, please call me at [phone]

## 2020-11-21 ENCOUNTER — Other Ambulatory Visit: Payer: Self-pay | Admitting: Family Medicine

## 2020-11-21 DIAGNOSIS — M792 Neuralgia and neuritis, unspecified: Secondary | ICD-10-CM

## 2020-11-21 DIAGNOSIS — J3089 Other allergic rhinitis: Secondary | ICD-10-CM

## 2020-11-21 NOTE — Telephone Encounter (Signed)
Requested medication (s) are due for refill today: yes  Requested medication (s) are on the active medication list: yes  Last refill:  08/17/20 expired 08/17/20   Future visit scheduled: no  Notes to clinic:  med not delegated to NT to RF   Requested Prescriptions  Pending Prescriptions Disp Refills   pregabalin (LYRICA) 150 MG capsule [Pharmacy Med Name: PREGABALIN 150 MG CAP] 30 capsule     Sig: TAKE 1 CAPSULE BY MOUTH AT BEDTIME     Not Delegated - Neurology:  Anticonvulsants - Controlled Failed - 11/21/2020 12:47 PM      Failed - This refill cannot be delegated      Passed - Valid encounter within last 12 months    Recent Outpatient Visits           1 month ago Viral sinusitis   [facility] [place], Coralie Keens, NP   3 months ago Primary insomnia   [facility] Olin Hauser, DO   1 year ago Mixed hyperlipidemia   [place], DO   2 years ago BPPV (benign paroxysmal positional vertigo), bilateral   [place], DO   2 years ago Chronic bilateral low back pain with bilateral sciatica   [place], DO              Refused Prescriptions Disp Refills   montelukast (SINGULAIR) 10 MG tablet [Pharmacy Med Name: MONTELUKAST SODIUM 10 MG TAB] 90 tablet     Sig: TAKE 1 TABLET BY MOUTH AT BEDTIME     Pulmonology:  Leukotriene Inhibitors Passed - 11/21/2020 12:47 PM      Passed - Valid encounter within last 12 months    Recent Outpatient Visits           1 month ago Viral sinusitis   [facility] [place], Coralie Keens, NP   3 months ago Primary insomnia   [place], Devonne Doughty, DO   1 year ago Mixed hyperlipidemia   [place], DO   2 years ago BPPV (benign paroxysmal positional vertigo), bilateral   [place], DO   2 years ago Chronic bilateral low back pain with bilateral sciatica   [place], DO

## 2020-11-21 NOTE — Telephone Encounter (Signed)
Requested Prescriptions  Pending Prescriptions Disp Refills  . pregabalin (LYRICA) 150 MG capsule [Pharmacy Med Name: PREGABALIN 150 MG CAP] 30 capsule     Sig: TAKE 1 CAPSULE BY MOUTH AT BEDTIME     Not Delegated - Neurology:  Anticonvulsants - Controlled Failed - 11/21/2020 12:47 PM      Failed - This refill cannot be delegated      Passed - Valid encounter within last 12 months    Recent Outpatient Visits          1 month ago Viral sinusitis   [facility] [place], Coralie Keens, NP   3 months ago Primary insomnia   [facility] Olin Hauser, DO   1 year ago Mixed hyperlipidemia   [place], DO   2 years ago BPPV (benign paroxysmal positional vertigo), bilateral   [place], DO   2 years ago Chronic bilateral low back pain with bilateral sciatica   [place], DO             . montelukast (SINGULAIR) 10 MG tablet [Pharmacy Med Name: MONTELUKAST SODIUM 10 MG TAB] 90 tablet     Sig: TAKE 1 TABLET BY MOUTH AT BEDTIME     Pulmonology:  Leukotriene Inhibitors Passed - 11/21/2020 12:47 PM      Passed - Valid encounter within last 12 months    Recent Outpatient Visits          1 month ago Viral sinusitis   [facility] [place], Coralie Keens, NP   3 months ago Primary insomnia   [place], Devonne Doughty, DO   1 year ago Mixed hyperlipidemia   [place], DO   2 years ago BPPV (benign paroxysmal positional vertigo), bilateral   [place], DO   2 years ago Chronic bilateral low back pain with bilateral sciatica   [place], DO

## 2020-11-24 ENCOUNTER — Other Ambulatory Visit: Payer: Self-pay | Admitting: Family Medicine

## 2020-11-24 DIAGNOSIS — J3089 Other allergic rhinitis: Secondary | ICD-10-CM

## 2020-11-24 NOTE — Telephone Encounter (Signed)
Patient called, left VM to return the call to the office to discuss the refill requested, discontinued by urologist on 12/26/19 Larene [place], PA-C).

## 2020-11-24 NOTE — Telephone Encounter (Signed)
Patient states he is out of this med, Montelukast Sodium 10 MG asking for short supply until refill comes in

## 2020-11-26 ENCOUNTER — Other Ambulatory Visit: Payer: Self-pay | Admitting: Family Medicine

## 2020-11-26 DIAGNOSIS — F338 Other recurrent depressive disorders: Secondary | ICD-10-CM

## 2020-11-26 DIAGNOSIS — F411 Generalized anxiety disorder: Secondary | ICD-10-CM

## 2020-11-26 DIAGNOSIS — E782 Mixed hyperlipidemia: Secondary | ICD-10-CM

## 2020-11-26 NOTE — Telephone Encounter (Signed)
Requested medication (s) are due for refill today: expired medication  Requested medication (s) are on the active medication list: yes  Last refill:  10/14/19 #90 3 refills  Future visit scheduled: no  Notes to clinic:  expired medication do you want to renew Rx? Last labs 10/14/19     Requested Prescriptions  Pending Prescriptions Disp Refills   atorvastatin (LIPITOR) 80 MG tablet [Pharmacy Med Name: ATORVASTATIN CALCIUM 80 MG TAB] 90 tablet 3    Sig: TAKE 1 TABLET BY MOUTH AT BEDTIME     Cardiovascular:  Antilipid - Statins Failed - 11/26/2020  1:15 PM      Failed - Total Cholesterol in normal range and within 360 days    Cholesterol, Total  Date Value Ref Range Status  09/06/2017 167 100 - 199 mg/dL Final   Cholesterol  Date Value Ref Range Status  10/14/2019 147 <200 mg/dL Final          Failed - LDL in normal range and within 360 days    LDL Cholesterol (Calc)  Date Value Ref Range Status  10/14/2019 73 mg/dL (calc) Final    Comment:    Reference range: <100 . Desirable range <100 mg/dL for primary prevention;   <70 mg/dL for patients with CHD or diabetic patients  with > or = 2 CHD risk factors. Marland Kitchen LDL-C is now calculated using the Martin-Hopkins  calculation, which is a validated novel method providing  better accuracy than the Friedewald equation in the  estimation of LDL-C.  Cresenciano Genre et al. Annamaria Helling. [id number]): 2061-2068  (http://education.[facility].com/faq/FAQ164)           Failed - HDL in normal range and within 360 days    HDL  Date Value Ref Range Status  10/14/2019 59 > OR = 40 mg/dL Final  09/06/2017 52 >39 mg/dL Final          Failed - Triglycerides in normal range and within 360 days    Triglycerides  Date Value Ref Range Status  10/14/2019 70 <150 mg/dL Final          Passed - Patient is not pregnant      Passed - Valid encounter within last 12 months    Recent Outpatient Visits           2 months ago Viral sinusitis   [facility] [place], Coralie Keens, NP   3 months ago Primary insomnia   [place], Devonne Doughty, DO   1 year ago Mixed hyperlipidemia   [place], DO   2 years ago BPPV (benign paroxysmal positional vertigo), bilateral   [place], DO   2 years ago Chronic bilateral low back pain with bilateral sciatica   [place], DO              Signed Prescriptions Disp Refills   venlafaxine XR (EFFEXOR-XR) 75 MG 24 hr capsule 90 capsule 0    Sig: TAKE 1 CAPSULE BY MOUTH ONCE DAILY     Psychiatry: Antidepressants - SNRI - desvenlafaxine & venlafaxine Failed - 11/26/2020  1:15 PM      Failed - LDL in normal range and within 360 days    LDL Cholesterol (Calc)  Date Value Ref Range Status  10/14/2019 73 mg/dL (calc) Final    Comment:    Reference range: <100 . Desirable range <100 mg/dL for primary prevention;   <  70 mg/dL for patients with CHD or diabetic patients  with > or = 2 CHD risk factors. Marland Kitchen LDL-C is now calculated using the Martin-Hopkins  calculation, which is a validated novel method providing  better accuracy than the Friedewald equation in the  estimation of LDL-C.  Cresenciano Genre et al. Annamaria Helling. [id number]): 2061-2068  (http://education.[facility].com/faq/FAQ164)           Failed - Total Cholesterol in normal range and within 360 days    Cholesterol, Total  Date Value Ref Range Status  09/06/2017 167 100 - 199 mg/dL Final   Cholesterol  Date Value Ref Range Status  10/14/2019 147 <200 mg/dL Final          Failed - Triglycerides in normal range and within 360 days    Triglycerides  Date Value Ref Range Status  10/14/2019 70 <150 mg/dL Final          Passed - Completed PHQ-2 or PHQ-9 in the last 360 days      Passed - Last BP in normal range    BP Readings from Last 1 Encounters:  09/24/20 105/78           Passed - Valid encounter within last 6 months    Recent Outpatient Visits           2 months ago Viral sinusitis   [facility] [place], Coralie Keens, NP   3 months ago Primary insomnia   [place], Devonne Doughty, DO   1 year ago Mixed hyperlipidemia   [place], DO   2 years ago BPPV (benign paroxysmal positional vertigo), bilateral   [place], DO   2 years ago Chronic bilateral low back pain with bilateral sciatica   [place], DO               busPIRone (BUSPAR) 15 MG tablet 180 tablet 0    Sig: TAKE 1 TABLET BY MOUTH TWICE DAILY     Psychiatry: Anxiolytics/Hypnotics - Non-controlled Passed - 11/26/2020  1:15 PM      Passed - Valid encounter within last 6 months    Recent Outpatient Visits           2 months ago Viral sinusitis   [facility] [place], Coralie Keens, NP   3 months ago Primary insomnia   [place], Devonne Doughty, DO   1 year ago Mixed hyperlipidemia   [place], DO   2 years ago BPPV (benign paroxysmal positional vertigo), bilateral   [place], DO   2 years ago Chronic bilateral low back pain with bilateral sciatica   [place], DO

## 2020-12-18 ENCOUNTER — Other Ambulatory Visit: Payer: Self-pay | Admitting: Family Medicine

## 2020-12-18 DIAGNOSIS — F5101 Primary insomnia: Secondary | ICD-10-CM

## 2020-12-18 NOTE — Telephone Encounter (Signed)
Requested medications are due for refill today NO  Requested medications are on the active medication list NO  Last visit 08/17/20  Future visit scheduled no  Notes to clinic Med not on current med list, please assess.

## 2021-01-07 DIAGNOSIS — Z23 Encounter for immunization: Secondary | ICD-10-CM | POA: Diagnosis not present

## 2021-01-22 ENCOUNTER — Other Ambulatory Visit: Payer: Self-pay | Admitting: Family Medicine

## 2021-01-22 DIAGNOSIS — M792 Neuralgia and neuritis, unspecified: Secondary | ICD-10-CM

## 2021-01-22 NOTE — Telephone Encounter (Signed)
Requested medications are due for refill today yes  Requested medications are on the active medication list yes  Last refill 12/18/20  Last visit 08/17/20  Future visit scheduled no  Notes to clinic Not delegated, asked to return in Sept, no upcoming visit scheduled.  Requested Prescriptions  Pending Prescriptions Disp Refills   pregabalin (LYRICA) 150 MG capsule [Pharmacy Med Name: PREGABALIN 150 MG CAP] 30 capsule     Sig: TAKE 1 CAPSULE BY MOUTH AT BEDTIME     Not Delegated - Neurology:  Anticonvulsants - Controlled Failed - 01/22/2021  9:33 AM      Failed - This refill cannot be delegated      Passed - Valid encounter within last 12 months    Recent Outpatient Visits           4 months ago Viral sinusitis   [facility] [place], Coralie Keens, NP   5 months ago Primary insomnia   [facility] Olin Hauser, DO   1 year ago Mixed hyperlipidemia   [place], DO   2 years ago BPPV (benign paroxysmal positional vertigo), bilateral   [place], DO   2 years ago Chronic bilateral low back pain with bilateral sciatica   [place], DO

## 2021-01-27 DIAGNOSIS — Z5181 Encounter for therapeutic drug level monitoring: Secondary | ICD-10-CM | POA: Diagnosis not present

## 2021-01-27 DIAGNOSIS — M1612 Unilateral primary osteoarthritis, left hip: Secondary | ICD-10-CM | POA: Diagnosis not present

## 2021-01-27 DIAGNOSIS — M5432 Sciatica, left side: Secondary | ICD-10-CM | POA: Diagnosis not present

## 2021-01-27 DIAGNOSIS — Z20822 Contact with and (suspected) exposure to covid-19: Secondary | ICD-10-CM | POA: Diagnosis not present

## 2021-01-27 DIAGNOSIS — M25559 Pain in unspecified hip: Secondary | ICD-10-CM | POA: Diagnosis not present

## 2021-01-27 DIAGNOSIS — E782 Mixed hyperlipidemia: Secondary | ICD-10-CM | POA: Diagnosis not present

## 2021-01-27 DIAGNOSIS — K219 Gastro-esophageal reflux disease without esophagitis: Secondary | ICD-10-CM | POA: Diagnosis not present

## 2021-01-27 DIAGNOSIS — M1990 Unspecified osteoarthritis, unspecified site: Secondary | ICD-10-CM | POA: Diagnosis not present

## 2021-01-27 DIAGNOSIS — F411 Generalized anxiety disorder: Secondary | ICD-10-CM | POA: Diagnosis not present

## 2021-01-27 DIAGNOSIS — Z7901 Long term (current) use of anticoagulants: Secondary | ICD-10-CM | POA: Diagnosis not present

## 2021-01-27 DIAGNOSIS — N049 Nephrotic syndrome with unspecified morphologic changes: Secondary | ICD-10-CM | POA: Diagnosis not present

## 2021-01-27 DIAGNOSIS — Z791 Long term (current) use of non-steroidal anti-inflammatories (NSAID): Secondary | ICD-10-CM | POA: Diagnosis not present

## 2021-01-27 DIAGNOSIS — Z79899 Other long term (current) drug therapy: Secondary | ICD-10-CM | POA: Diagnosis not present

## 2021-01-27 DIAGNOSIS — I471 Supraventricular tachycardia: Secondary | ICD-10-CM | POA: Diagnosis not present

## 2021-01-27 DIAGNOSIS — I639 Cerebral infarction, unspecified: Secondary | ICD-10-CM | POA: Diagnosis not present

## 2021-01-28 DIAGNOSIS — M25559 Pain in unspecified hip: Secondary | ICD-10-CM | POA: Diagnosis not present

## 2021-01-28 DIAGNOSIS — M1612 Unilateral primary osteoarthritis, left hip: Secondary | ICD-10-CM | POA: Diagnosis not present

## 2021-02-15 DIAGNOSIS — G894 Chronic pain syndrome: Secondary | ICD-10-CM | POA: Diagnosis not present

## 2021-02-15 DIAGNOSIS — F411 Generalized anxiety disorder: Secondary | ICD-10-CM | POA: Diagnosis not present

## 2021-02-15 DIAGNOSIS — M1990 Unspecified osteoarthritis, unspecified site: Secondary | ICD-10-CM | POA: Diagnosis not present

## 2021-02-15 DIAGNOSIS — E782 Mixed hyperlipidemia: Secondary | ICD-10-CM | POA: Diagnosis not present

## 2021-02-15 DIAGNOSIS — I639 Cerebral infarction, unspecified: Secondary | ICD-10-CM | POA: Diagnosis not present

## 2021-02-15 DIAGNOSIS — F32A Depression, unspecified: Secondary | ICD-10-CM | POA: Diagnosis not present

## 2021-02-15 DIAGNOSIS — R6882 Decreased libido: Secondary | ICD-10-CM | POA: Diagnosis not present

## 2021-02-15 DIAGNOSIS — M5432 Sciatica, left side: Secondary | ICD-10-CM | POA: Diagnosis not present

## 2021-02-15 DIAGNOSIS — N400 Enlarged prostate without lower urinary tract symptoms: Secondary | ICD-10-CM | POA: Diagnosis not present

## 2021-02-15 DIAGNOSIS — R5383 Other fatigue: Secondary | ICD-10-CM | POA: Diagnosis not present

## 2021-02-15 DIAGNOSIS — G451 Carotid artery syndrome (hemispheric): Secondary | ICD-10-CM | POA: Diagnosis not present

## 2021-02-15 DIAGNOSIS — K219 Gastro-esophageal reflux disease without esophagitis: Secondary | ICD-10-CM | POA: Diagnosis not present

## 2021-02-19 DIAGNOSIS — Z0000 Encounter for general adult medical examination without abnormal findings: Secondary | ICD-10-CM | POA: Diagnosis not present

## 2021-02-19 DIAGNOSIS — R5383 Other fatigue: Secondary | ICD-10-CM | POA: Diagnosis not present

## 2021-02-19 DIAGNOSIS — R6882 Decreased libido: Secondary | ICD-10-CM | POA: Diagnosis not present

## 2021-02-19 DIAGNOSIS — Z01812 Encounter for preprocedural laboratory examination: Secondary | ICD-10-CM | POA: Diagnosis not present

## 2021-02-19 DIAGNOSIS — E782 Mixed hyperlipidemia: Secondary | ICD-10-CM | POA: Diagnosis not present

## 2021-02-23 DIAGNOSIS — G8918 Other acute postprocedural pain: Secondary | ICD-10-CM | POA: Diagnosis not present

## 2021-02-23 DIAGNOSIS — M1612 Unilateral primary osteoarthritis, left hip: Secondary | ICD-10-CM | POA: Diagnosis present

## 2021-02-23 DIAGNOSIS — G894 Chronic pain syndrome: Secondary | ICD-10-CM | POA: Diagnosis present

## 2021-02-23 DIAGNOSIS — Z8673 Personal history of transient ischemic attack (TIA), and cerebral infarction without residual deficits: Secondary | ICD-10-CM | POA: Diagnosis not present

## 2021-02-23 DIAGNOSIS — E782 Mixed hyperlipidemia: Secondary | ICD-10-CM | POA: Diagnosis present

## 2021-02-23 DIAGNOSIS — Z7982 Long term (current) use of aspirin: Secondary | ICD-10-CM | POA: Diagnosis not present

## 2021-02-23 DIAGNOSIS — F419 Anxiety disorder, unspecified: Secondary | ICD-10-CM | POA: Diagnosis not present

## 2021-02-23 DIAGNOSIS — M25552 Pain in left hip: Secondary | ICD-10-CM | POA: Diagnosis not present

## 2021-02-23 DIAGNOSIS — Z87891 Personal history of nicotine dependence: Secondary | ICD-10-CM | POA: Diagnosis not present

## 2021-02-23 DIAGNOSIS — E785 Hyperlipidemia, unspecified: Secondary | ICD-10-CM | POA: Diagnosis not present

## 2021-02-23 DIAGNOSIS — I10 Essential (primary) hypertension: Secondary | ICD-10-CM | POA: Diagnosis not present

## 2021-02-23 DIAGNOSIS — Z96642 Presence of left artificial hip joint: Secondary | ICD-10-CM | POA: Diagnosis not present

## 2021-02-23 DIAGNOSIS — I129 Hypertensive chronic kidney disease with stage 1 through stage 4 chronic kidney disease, or unspecified chronic kidney disease: Secondary | ICD-10-CM | POA: Diagnosis present

## 2021-02-23 DIAGNOSIS — N189 Chronic kidney disease, unspecified: Secondary | ICD-10-CM | POA: Diagnosis present

## 2021-02-23 DIAGNOSIS — Z471 Aftercare following joint replacement surgery: Secondary | ICD-10-CM | POA: Diagnosis not present

## 2021-02-23 DIAGNOSIS — Z79899 Other long term (current) drug therapy: Secondary | ICD-10-CM | POA: Diagnosis not present

## 2021-02-23 DIAGNOSIS — F411 Generalized anxiety disorder: Secondary | ICD-10-CM | POA: Diagnosis present

## 2021-02-23 DIAGNOSIS — K219 Gastro-esophageal reflux disease without esophagitis: Secondary | ICD-10-CM | POA: Diagnosis present

## 2021-02-23 DIAGNOSIS — F32A Depression, unspecified: Secondary | ICD-10-CM | POA: Diagnosis present

## 2021-02-26 ENCOUNTER — Telehealth: Payer: Self-pay

## 2021-02-26 NOTE — Telephone Encounter (Signed)
Transition Care Management Follow-up Telephone Call Date of discharge and from where: 02/25/21 [facility] How have you been since you were released from the hospital? Pain, trying to get up after hip surgery Any questions or concerns? No  Items Reviewed: Did the pt receive and understand the discharge instructions provided? Yes  Medications obtained and verified? Yes  Other? No  Any new allergies since your discharge? No  Dietary orders reviewed? No Do you have support at home? Yes   Home Care and Equipment/Supplies: Were home health services ordered? Yes   If so, what is the name of the agency? He doesn't know  Has the agency set up a time to come to the patient's home? no Were any new equipment or medical supplies ordered?  No  Functional Questionnaire: (I = Independent and D = Dependent) ADLs:  I  Bathing/Dressing- I  Meal Prep- I  Eating- I  Maintaining continence- I  Transferring/Ambulation- D  Managing Meds- I  Follow up appointments reviewed:  PCP Hospital f/u appt confirmed? No   Specialist Hospital f/u appt confirmed? No   Are transportation arrangements needed? No  If their condition worsens, is the pt aware to call PCP or go to the Emergency Dept.? Yes Was the patient provided with contact information for the PCP's office or ED? Yes Was to pt encouraged to call back with questions or concerns? Yes

## 2021-03-02 ENCOUNTER — Other Ambulatory Visit: Payer: Self-pay | Admitting: Family Medicine

## 2021-03-02 DIAGNOSIS — M792 Neuralgia and neuritis, unspecified: Secondary | ICD-10-CM

## 2021-03-03 NOTE — Telephone Encounter (Signed)
Requested medications are due for refill today.  Seems a bit too soon  Requested medications are on the active medications list.  yes  Last refill. 01/25/2021 #90   Future visit scheduled.   no  Notes to clinic.  Medication not delegated.    Requested Prescriptions  Pending Prescriptions Disp Refills   pregabalin (LYRICA) 150 MG capsule [Pharmacy Med Name: PREGABALIN 150 MG CAP] 90 capsule     Sig: TAKE 1 CAPSULE BY MOUTH AT BEDTIME     Not Delegated - Neurology:  Anticonvulsants - Controlled Failed - 03/02/2021  6:53 PM      Failed - This refill cannot be delegated      Passed - Valid encounter within last 12 months    Recent Outpatient Visits           5 months ago Viral sinusitis   [facility] [place], Coralie Keens, NP   6 months ago Primary insomnia   [facility] Olin Hauser, DO   1 year ago Mixed hyperlipidemia   [place], DO   2 years ago BPPV (benign paroxysmal positional vertigo), bilateral   [place], DO   2 years ago Chronic bilateral low back pain with bilateral sciatica   [place], DO

## 2021-03-11 ENCOUNTER — Other Ambulatory Visit: Payer: Self-pay | Admitting: Family Medicine

## 2021-03-11 DIAGNOSIS — E782 Mixed hyperlipidemia: Secondary | ICD-10-CM

## 2021-03-11 NOTE — Telephone Encounter (Signed)
Patient has a new provider, will refuse medication.

## 2021-05-25 ENCOUNTER — Other Ambulatory Visit: Payer: Self-pay | Admitting: Family Medicine

## 2021-05-25 DIAGNOSIS — M792 Neuralgia and neuritis, unspecified: Secondary | ICD-10-CM

## 2021-05-26 NOTE — Telephone Encounter (Signed)
Requested medication (s) are due for refill today: yes ? ?Requested medication (s) are on the active medication list: yes   ? ?Last refill: 01/25/21  #90  0 refills ? ?Future visit scheduled no ? ?Notes to clinic:not delegated, please review. ? ?Requested Prescriptions  ?Pending Prescriptions Disp Refills  ? pregabalin (LYRICA) 150 MG capsule [Pharmacy Med Name: PREGABALIN 150 MG CAP] 90 capsule   ?  Sig: TAKE 1 CAPSULE BY MOUTH AT BEDTIME  ?  ? Not Delegated - Neurology:  Anticonvulsants - Controlled - pregabalin Failed - 05/25/2021  4:40 PM  ?  ?  Failed - This refill cannot be delegated  ?  ?  Failed - Cr in normal range and within 360 days  ?  Creat  ?Date Value Ref Range Status  ?10/14/2019 0.94 0.70 - 1.25 mg/dL Final  ?  Comment:  ?  For patients >9 years of age, the reference limit ?for Creatinine is approximately 13% higher for people ?identified as African-American. ?. ?  ? ?Creatinine, Ser  ?Date Value Ref Range Status  ?01/13/2020 0.90 0.61 - 1.24 mg/dL Final  ?  ?  ?  ?  Passed - Completed PHQ-2 or PHQ-9 in the last 360 days  ?  ?  Passed - Valid encounter within last 12 months  ?  Recent Outpatient Visits   ? ?      ? 8 months ago Viral sinusitis  ? [facility] [place], Coralie Keens, NP  ? 9 months ago Primary insomnia  ? [place], DO  ? 1 year ago Mixed hyperlipidemia  ? [place], DO  ? 2 years ago BPPV (benign paroxysmal positional vertigo), bilateral  ? [facility] [place], Devonne Doughty, DO  ? 2 years ago Chronic bilateral low back pain with bilateral sciatica  ? [place], DO  ? ?  ?  ? ?  ?  ?  ? ? ? ? ?

## 2021-06-02 ENCOUNTER — Other Ambulatory Visit: Payer: Self-pay | Admitting: Family Medicine

## 2021-06-02 DIAGNOSIS — M792 Neuralgia and neuritis, unspecified: Secondary | ICD-10-CM

## 2021-06-04 NOTE — Telephone Encounter (Signed)
Requested medication (s) are due for refill today: yes ? ?Requested medication (s) are on the active medication list: yes ? ?Last refill:  01/25/21 #90 ? ?Future visit scheduled: no  ? ?Notes to clinic:  Please review for refill. Refill not delegated per protocol. ? ? ? ?Requested Prescriptions  ?Pending Prescriptions Disp Refills  ? pregabalin (LYRICA) 150 MG capsule [Pharmacy Med Name: PREGABALIN 150 MG CAP] 90 capsule   ?  Sig: TAKE 1 CAPSULE BY MOUTH AT BEDTIME  ?  ? Not Delegated - Neurology:  Anticonvulsants - Controlled - pregabalin Failed - 06/02/2021  1:22 PM  ?  ?  Failed - This refill cannot be delegated  ?  ?  Failed - Cr in normal range and within 360 days  ?  Creat  ?Date Value Ref Range Status  ?10/14/2019 0.94 0.70 - 1.25 mg/dL Final  ?  Comment:  ?  For patients >41 years of age, the reference limit ?for Creatinine is approximately 13% higher for people ?identified as African-American. ?. ?  ? ?Creatinine, Ser  ?Date Value Ref Range Status  ?01/13/2020 0.90 0.61 - 1.24 mg/dL Final  ?  ?  ?  ?  Passed - Completed PHQ-2 or PHQ-9 in the last 360 days  ?  ?  Passed - Valid encounter within last 12 months  ?  Recent Outpatient Visits   ? ?      ? 8 months ago Viral sinusitis  ? [facility], Coralie Keens, NP  ? 9 months ago Primary insomnia  ? [place], DO  ? 1 year ago Mixed hyperlipidemia  ? [place], DO  ? 2 years ago BPPV (benign paroxysmal positional vertigo), bilateral  ? [facility] [place], Devonne Doughty, DO  ? 2 years ago Chronic bilateral low back pain with bilateral sciatica  ? [place], DO  ? ?  ?  ? ?  ?  ?  ? ?

## 2021-06-06 ENCOUNTER — Other Ambulatory Visit: Payer: Self-pay | Admitting: Family Medicine

## 2021-06-06 DIAGNOSIS — L219 Seborrheic dermatitis, unspecified: Secondary | ICD-10-CM

## 2021-06-08 NOTE — Telephone Encounter (Signed)
Requested medication (s) are due for refill today: yes ? ?Requested medication (s) are on the active medication list: yes ? ?Last refill:  10/19/20 #124m/1 ? ?Future visit scheduled: no ? ?Notes to clinic:  Unable to refill per protocol, cannot delegate. ? ? ? ?  ?Requested Prescriptions  ?Pending Prescriptions Disp Refills  ? ketoconazole (NIZORAL) 2 % shampoo [Pharmacy Med Name: KETOCONAZOLE 2% TOP SHAM ML] 120 mL 1  ?  Sig: APPLY TOPICALLY AS NEEDED IRRITATION TO SCALP 1-2 TIMES A WEEK  ?  ? Not Delegated - Over the Counter: OTC 2 Failed - 06/06/2021 12:14 PM  ?  ?  Failed - This refill cannot be delegated  ?  ?  Passed - Valid encounter within last 12 months  ?  Recent Outpatient Visits   ? ?      ? 8 months [facility]  [facility]  ? 9 months ago Primary insomnia  ? [place]  ? 1 year ago Mixed hyperlipidemia  ? [place] DO  ? 2 years ago BPPV (benign paroxysmal positional vertigo), bilateral  ? [place]  ? 2 years ago Chronic bilateral low back pain with bilateral sciatica  ? [place] DO  ? ?  ?  ? ?  ?  ?  ? ?

## 2021-06-17 ENCOUNTER — Telehealth: Payer: Self-pay | Admitting: Family Medicine

## 2021-06-17 NOTE — Telephone Encounter (Signed)
Left message for patient to call back and schedule the Medicare Annual Wellness Visit (AWV) virtually or by telephone. ? ?Last AWV 08/02/17 ? ?[facility]. ? ? ?Any questions, please call me at [phone]  ?

## 2021-06-28 ENCOUNTER — Other Ambulatory Visit: Payer: Self-pay | Admitting: Family Medicine

## 2021-06-28 DIAGNOSIS — I471 Supraventricular tachycardia: Secondary | ICD-10-CM

## 2021-06-29 NOTE — Telephone Encounter (Signed)
Requested medication (s) are due for refill today: Yes ? ?Requested medication (s) are on the active medication list: Yes ? ?Last refill:  10/14/19 ? ?Future visit scheduled: No - left message to make appointment. ? ?Notes to clinic:  Prescription has expired. ? ? ? ?Requested Prescriptions  ?Pending Prescriptions Disp Refills  ? metoprolol succinate (TOPROL-XL) 25 MG 24 hr tablet [Pharmacy Med Name: METOPROLOL SUCCINATE ER 25 MG TAB] 90 tablet 3  ?  Sig: TAKE 1 TABLET BY MOUTH ONCE DAILY  ?  ? Cardiovascular:  Beta Blockers Failed - 06/28/2021 12:33 PM  ?  ?  Failed - Valid encounter within last 6 months  ?  Recent Outpatient Visits   ? ?      ? 9 months ago Viral sinusitis  ? [facility] [place] [place], NP  ? 10 months ago Primary insomnia  ? [place], DO  ? 1 year ago Mixed hyperlipidemia  ? [place], DO  ? 2 years ago BPPV (benign paroxysmal positional vertigo), bilateral  ? [place], DO  ? 3 years ago Chronic bilateral low back pain with bilateral sciatica  ? [place], DO  ? ?  ?  ? ? ?  ?  ?  Passed - Last BP in normal range  ?  BP Readings from Last 1 Encounters:  ?09/24/20 105/78  ?  ?  ?  ?  Passed - Last Heart Rate in normal range  ?  Pulse Readings from Last 1 Encounters:  ?09/24/20 67  ?  ?  ?  ?  ? ?
